# Patient Record
Sex: Female | Born: 1985
Health system: Southern US, Community
[De-identification: ages and names within clinical notes are randomized; demographics above are authoritative.]

## PROBLEM LIST (undated history)

## (undated) ENCOUNTER — Emergency Department: Discharge status: Absent without leave | Payer: Self-pay

## (undated) DIAGNOSIS — R87629 Unspecified abnormal cytological findings in specimens from vagina: Secondary | ICD-10-CM

## (undated) DIAGNOSIS — J45909 Unspecified asthma, uncomplicated: Secondary | ICD-10-CM

## (undated) DIAGNOSIS — T7840XA Allergy, unspecified, initial encounter: Secondary | ICD-10-CM

## (undated) DIAGNOSIS — K219 Gastro-esophageal reflux disease without esophagitis: Secondary | ICD-10-CM

## (undated) DIAGNOSIS — E7410 Disorder of fructose metabolism, unspecified: Secondary | ICD-10-CM

## (undated) DIAGNOSIS — D649 Anemia, unspecified: Secondary | ICD-10-CM

## (undated) DIAGNOSIS — E739 Lactose intolerance, unspecified: Secondary | ICD-10-CM

## (undated) HISTORY — DX: Unspecified asthma, uncomplicated: J45.909

## (undated) HISTORY — DX: Anemia, unspecified: D64.9

## (undated) HISTORY — DX: Disorder of fructose metabolism, unspecified: E74.10

## (undated) HISTORY — DX: Gastro-esophageal reflux disease without esophagitis: K21.9

## (undated) HISTORY — DX: Allergy, unspecified, initial encounter: T78.40XA

## (undated) HISTORY — PX: NO PAST SURGERIES: SHX2092

## (undated) HISTORY — DX: Unspecified abnormal cytological findings in specimens from vagina: R87.629

## (undated) HISTORY — DX: Lactose intolerance, unspecified: E73.9

---
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
## 2006-03-29 DIAGNOSIS — G43909 Migraine, unspecified, not intractable, without status migrainosus: Secondary | ICD-10-CM | POA: Insufficient documentation

## 2006-03-29 HISTORY — DX: Migraine, unspecified, not intractable, without status migrainosus: G43.909

## 2017-04-18 LAB — BASIC METABOLIC PANEL
BUN: 8 (ref 4–21)
CO2: 25 — AB (ref 13–22)
Chloride: 105 (ref 99–108)
Creatinine: 0.6 (ref 0.5–1.1)
Glucose: 101
Potassium: 4.6 (ref 3.4–5.3)
Sodium: 138 (ref 137–147)

## 2017-04-18 LAB — CBC AND DIFFERENTIAL
HCT: 39 (ref 36–46)
Hemoglobin: 12.6 (ref 12.0–16.0)
Neutrophils Absolute: 9.9
Platelets: 331 (ref 150–399)
WBC: 11.3

## 2017-04-18 LAB — COMPREHENSIVE METABOLIC PANEL
Albumin: 4.5 (ref 3.5–5.0)
Calcium: 9.9 (ref 8.7–10.7)
Globulin: 2.8

## 2017-04-18 LAB — HEPATIC FUNCTION PANEL
ALT: 15 (ref 7–35)
AST: 15 (ref 13–35)
Alkaline Phosphatase: 91 (ref 25–125)
Bilirubin, Total: 0.3

## 2017-04-18 LAB — CBC: RBC: 4.32 (ref 3.87–5.11)

## 2019-10-01 LAB — HM PAP SMEAR: HM Pap smear: NORMAL

## 2020-02-11 DIAGNOSIS — R42 Dizziness and giddiness: Secondary | ICD-10-CM | POA: Diagnosis not present

## 2020-02-11 DIAGNOSIS — Z131 Encounter for screening for diabetes mellitus: Secondary | ICD-10-CM | POA: Diagnosis not present

## 2020-02-11 DIAGNOSIS — Z114 Encounter for screening for human immunodeficiency virus [HIV]: Secondary | ICD-10-CM | POA: Diagnosis not present

## 2020-02-11 DIAGNOSIS — Z13220 Encounter for screening for lipoid disorders: Secondary | ICD-10-CM | POA: Diagnosis not present

## 2020-02-11 DIAGNOSIS — D539 Nutritional anemia, unspecified: Secondary | ICD-10-CM | POA: Diagnosis not present

## 2020-02-11 DIAGNOSIS — R12 Heartburn: Secondary | ICD-10-CM | POA: Diagnosis not present

## 2020-02-11 DIAGNOSIS — Z0000 Encounter for general adult medical examination without abnormal findings: Secondary | ICD-10-CM | POA: Diagnosis not present

## 2020-02-11 DIAGNOSIS — R5383 Other fatigue: Secondary | ICD-10-CM | POA: Diagnosis not present

## 2020-02-11 DIAGNOSIS — E559 Vitamin D deficiency, unspecified: Secondary | ICD-10-CM | POA: Diagnosis not present

## 2020-02-11 DIAGNOSIS — J45909 Unspecified asthma, uncomplicated: Secondary | ICD-10-CM | POA: Diagnosis not present

## 2020-02-11 DIAGNOSIS — M1990 Unspecified osteoarthritis, unspecified site: Secondary | ICD-10-CM | POA: Diagnosis not present

## 2020-02-22 ENCOUNTER — Other Ambulatory Visit (HOSPITAL_COMMUNITY): Payer: Self-pay | Admitting: Physician Assistant

## 2020-02-22 DIAGNOSIS — R12 Heartburn: Secondary | ICD-10-CM | POA: Diagnosis not present

## 2020-02-22 DIAGNOSIS — Z20822 Contact with and (suspected) exposure to covid-19: Secondary | ICD-10-CM | POA: Diagnosis not present

## 2020-02-22 DIAGNOSIS — M1990 Unspecified osteoarthritis, unspecified site: Secondary | ICD-10-CM | POA: Diagnosis not present

## 2020-02-22 DIAGNOSIS — R42 Dizziness and giddiness: Secondary | ICD-10-CM | POA: Diagnosis not present

## 2020-02-22 DIAGNOSIS — M545 Low back pain: Secondary | ICD-10-CM | POA: Diagnosis not present

## 2020-03-07 DIAGNOSIS — Z20822 Contact with and (suspected) exposure to covid-19: Secondary | ICD-10-CM | POA: Diagnosis not present

## 2020-03-12 DIAGNOSIS — J069 Acute upper respiratory infection, unspecified: Secondary | ICD-10-CM | POA: Diagnosis not present

## 2020-03-12 DIAGNOSIS — R0602 Shortness of breath: Secondary | ICD-10-CM | POA: Diagnosis not present

## 2020-04-02 DIAGNOSIS — Z30432 Encounter for removal of intrauterine contraceptive device: Secondary | ICD-10-CM | POA: Diagnosis not present

## 2020-04-21 DIAGNOSIS — M25561 Pain in right knee: Secondary | ICD-10-CM | POA: Diagnosis not present

## 2020-04-21 MED FILL — MELOXICAM 15 MG TABLET: 15 | 30 days supply | Qty: 30 | Fill #0

## 2020-04-25 DIAGNOSIS — M25561 Pain in right knee: Secondary | ICD-10-CM | POA: Diagnosis not present

## 2020-04-28 DIAGNOSIS — M25561 Pain in right knee: Secondary | ICD-10-CM | POA: Diagnosis not present

## 2020-05-16 DIAGNOSIS — N924 Excessive bleeding in the premenopausal period: Secondary | ICD-10-CM | POA: Diagnosis not present

## 2020-05-16 DIAGNOSIS — N938 Other specified abnormal uterine and vaginal bleeding: Secondary | ICD-10-CM | POA: Diagnosis not present

## 2020-05-22 DIAGNOSIS — Z20822 Contact with and (suspected) exposure to covid-19: Secondary | ICD-10-CM | POA: Diagnosis not present

## 2020-05-22 DIAGNOSIS — E559 Vitamin D deficiency, unspecified: Secondary | ICD-10-CM | POA: Diagnosis not present

## 2020-05-22 DIAGNOSIS — Z79899 Other long term (current) drug therapy: Secondary | ICD-10-CM | POA: Diagnosis not present

## 2020-05-22 DIAGNOSIS — R12 Heartburn: Secondary | ICD-10-CM | POA: Diagnosis not present

## 2020-05-22 DIAGNOSIS — M25561 Pain in right knee: Secondary | ICD-10-CM | POA: Diagnosis not present

## 2020-05-22 DIAGNOSIS — E7800 Pure hypercholesterolemia, unspecified: Secondary | ICD-10-CM | POA: Diagnosis not present

## 2020-06-11 DIAGNOSIS — Z20822 Contact with and (suspected) exposure to covid-19: Secondary | ICD-10-CM | POA: Diagnosis not present

## 2020-06-12 DIAGNOSIS — J329 Chronic sinusitis, unspecified: Secondary | ICD-10-CM | POA: Diagnosis not present

## 2020-06-12 DIAGNOSIS — E559 Vitamin D deficiency, unspecified: Secondary | ICD-10-CM | POA: Diagnosis not present

## 2020-06-12 DIAGNOSIS — B9689 Other specified bacterial agents as the cause of diseases classified elsewhere: Secondary | ICD-10-CM | POA: Diagnosis not present

## 2020-06-12 DIAGNOSIS — E7800 Pure hypercholesterolemia, unspecified: Secondary | ICD-10-CM | POA: Diagnosis not present

## 2020-07-08 DIAGNOSIS — Z20822 Contact with and (suspected) exposure to covid-19: Secondary | ICD-10-CM | POA: Diagnosis not present

## 2020-07-28 DIAGNOSIS — M1990 Unspecified osteoarthritis, unspecified site: Secondary | ICD-10-CM | POA: Diagnosis not present

## 2020-07-28 DIAGNOSIS — M545 Low back pain, unspecified: Secondary | ICD-10-CM | POA: Diagnosis not present

## 2020-07-28 DIAGNOSIS — L089 Local infection of the skin and subcutaneous tissue, unspecified: Secondary | ICD-10-CM | POA: Diagnosis not present

## 2020-08-29 DIAGNOSIS — M545 Low back pain, unspecified: Secondary | ICD-10-CM | POA: Diagnosis not present

## 2020-08-29 DIAGNOSIS — Z79899 Other long term (current) drug therapy: Secondary | ICD-10-CM | POA: Diagnosis not present

## 2020-08-29 DIAGNOSIS — E559 Vitamin D deficiency, unspecified: Secondary | ICD-10-CM | POA: Diagnosis not present

## 2020-08-29 DIAGNOSIS — Z20822 Contact with and (suspected) exposure to covid-19: Secondary | ICD-10-CM | POA: Diagnosis not present

## 2020-08-29 DIAGNOSIS — L089 Local infection of the skin and subcutaneous tissue, unspecified: Secondary | ICD-10-CM | POA: Diagnosis not present

## 2020-08-29 DIAGNOSIS — E7800 Pure hypercholesterolemia, unspecified: Secondary | ICD-10-CM | POA: Diagnosis not present

## 2020-08-29 DIAGNOSIS — M1990 Unspecified osteoarthritis, unspecified site: Secondary | ICD-10-CM | POA: Diagnosis not present

## 2020-09-09 ENCOUNTER — Ambulatory Visit: Payer: Self-pay | Admitting: Medical-Surgical

## 2020-09-18 DIAGNOSIS — J018 Other acute sinusitis: Secondary | ICD-10-CM | POA: Diagnosis not present

## 2020-09-18 DIAGNOSIS — H9203 Otalgia, bilateral: Secondary | ICD-10-CM | POA: Diagnosis not present

## 2020-10-06 DIAGNOSIS — Z1159 Encounter for screening for other viral diseases: Secondary | ICD-10-CM | POA: Diagnosis not present

## 2020-10-06 DIAGNOSIS — Z111 Encounter for screening for respiratory tuberculosis: Secondary | ICD-10-CM | POA: Diagnosis not present

## 2020-10-06 DIAGNOSIS — Z020 Encounter for examination for admission to educational institution: Secondary | ICD-10-CM | POA: Diagnosis not present

## 2020-10-06 DIAGNOSIS — Z7251 High risk heterosexual behavior: Secondary | ICD-10-CM | POA: Diagnosis not present

## 2020-10-06 DIAGNOSIS — Z20822 Contact with and (suspected) exposure to covid-19: Secondary | ICD-10-CM | POA: Diagnosis not present

## 2020-10-06 DIAGNOSIS — R300 Dysuria: Secondary | ICD-10-CM | POA: Diagnosis not present

## 2020-10-06 NOTE — Progress Notes (Signed)
New Patient Office Visit  Subjective:  Patient ID: Erica Boyer, female    DOB: 10/29/1985  Age: 35 y.o. MRN: [id number]  CC:  Chief Complaint  Patient presents with  . Establish Care    HPI Erica Boyer presents to establish care.  Asthma-she does have a history of asthma but usually does not have much trouble with it.  Notes that her asthma symptoms are not worse when her seasonal allergies flareup.  Has an albuterol inhaler that she uses infrequently as needed.  Seasonal allergies-lifelong history of severe seasonal allergies.  She is currently taking Zyrtec and has been on this for years.  Also using Flonase daily.  She used to take Singulair in the past but notes that it started making her have mood swings and she did not like this effect so she stopped the medicine on her own.  Birth control-currently sexually active with 1 female partner, her husband.  They have a 23-1/2-year-old child and were initially trying to get pregnant since November of last year.  Recently got admitted to school as did her husband so they are going to hold off on trying to conceive for now.  Since she will be wearing white uniforms, she would like to return to using NuvaRing as she did in years prior.  Notes that she likes the NuvaRing because she is able to skip periods and it worked well for her in the past.  She is a non-smoker and although she does have a history of migraines, they did not involve an aura.  Sinus/ear pain-has had 2 weeks of sinus fullness/congestion.  Notes that her ears have been hurting her starting behind the earlobes and extending down the neck. Has sinus pain over the frontal and maxillary areas but no dental pain.  Clear to milky nasal discharge when blowing her nose.  Mild cough.  Denies fever, chills, shortness of breath, chest pain, or GI symptoms.  Past Medical History:  Diagnosis Date  . Asthma   . Fructose intolerance   . GERD (gastroesophageal reflux disease)   .  Lactose intolerance     Past Surgical History:  Procedure Laterality Date  . NO PAST SURGERIES      Family History  Problem Relation Age of Onset  . Hypertension Other   . Heart attack Other   . Glaucoma Other   . Cataracts Other   . Dementia Other   . Diabetes Other   . Stroke Other   . Esophageal cancer Other     Social History   Socioeconomic History  . Marital status: Unknown    Spouse name: Not on file  . Number of children: Not on file  . Years of education: Not on file  . Highest education level: Not on file  Occupational History  . Not on file  Tobacco Use  . Smoking status: Never Smoker  . Smokeless tobacco: Never Used  Vaping Use  . Vaping Use: Never used  Substance and Sexual Activity  . Alcohol use: Yes    Comment: Rarely  . Drug use: Never  . Sexual activity: Yes  Other Topics Concern  . Not on file  Social History Narrative  . Not on file   Social Determinants of Health   Financial Resource Strain: Not on file  Food Insecurity: Not on file  Transportation Needs: Not on file  Physical Activity: Not on file  Stress: Not on file  Social Connections: Not on file  Intimate Partner Violence: Not  on file    ROS Review of Systems  Constitutional: Negative for chills, fatigue, fever and unexpected weight change.  HENT: Positive for congestion, ear pain, sinus pressure and sinus pain.   Respiratory: Negative for cough, chest tightness, shortness of breath and wheezing.   Cardiovascular: Negative for chest pain, palpitations and leg swelling.  Neurological: Positive for headaches (Intermittent, infrequent). Negative for dizziness and light-headedness.  Psychiatric/Behavioral: Negative for dysphoric mood, self-injury, sleep disturbance and suicidal ideas. The patient is not nervous/anxious.     Objective:   Today's Vitals: BP 119/82   Pulse 89   Temp 98.4 F (36.9 C)   Ht 5' 1.25" (1.556 m)   Wt 219 lb 9.6 oz (99.6 kg)   LMP 09/24/2020    SpO2 98%   BMI 41.16 kg/m   Physical Exam Vitals reviewed.  Constitutional:      General: She is not in acute distress.    Appearance: Normal appearance.  HENT:     Head: Normocephalic and atraumatic.     Right Ear: Ear canal and external ear normal.     Left Ear: Ear canal and external ear normal.     Ears:     Comments: Bilateral serous effusion    Nose: Congestion present.  Eyes:     General:        Right eye: No discharge.        Left eye: No discharge.     Extraocular Movements: Extraocular movements intact.     Conjunctiva/sclera: Conjunctivae normal.     Pupils: Pupils are equal, round, and reactive to light.  Cardiovascular:     Rate and Rhythm: Normal rate and regular rhythm.     Pulses: Normal pulses.     Heart sounds: Normal heart sounds. No murmur heard. No friction rub. No gallop.   Pulmonary:     Effort: Pulmonary effort is normal. No respiratory distress.     Breath sounds: Normal breath sounds. No wheezing.  Skin:    General: Skin is warm and dry.  Neurological:     Mental Status: She is alert and oriented to person, place, and time.  Psychiatric:        Mood and Affect: Mood normal.        Behavior: Behavior normal.        Thought Content: Thought content normal.        Judgment: Judgment normal.    Assessment & Plan:   1. Encounter to establish care Reviewed available information and discussed healthcare concerns with patient.  She will be due for her Pap smear soon and originally had this scheduled with OB/GYN.  She may decide to cancel that appointment and have her return to her care needs done here.  2. Encounter for initial prescription of contraceptives, unspecified contraceptive POCT UPT negative.  Starting NuvaRing. - POCT urine pregnancy  3. Acute non-recurrent pansinusitis Since she has had 2 weeks of symptoms, we will go ahead and treat for acute sinusitis.  She does have an allergy to Keflex because of shortness of breath and hives but  she has tolerated Augmentin without difficulty.  Starting Augmentin twice daily x7 days.  Advised should she have any side effects or problems when she takes this medication, she should notify me immediately and we can change the antibiotic.  4. Mild intermittent asthma without complication Continue albuterol inhaler 2 puffs every 4 hours as needed.  Refills provided.  5. Seasonal allergies Recommend switching from Zyrtec to Xyzal since she  has been on Zyrtec for many years and this is not controlling her allergies very well.  Continue Flonase.  Since Singulair caused mood swings, we will hold off on adding this back for now.  Outpatient Encounter Medications as of 10/07/2020  Medication Sig  . amoxicillin-clavulanate (AUGMENTIN) 875-125 MG tablet Take 1 tablet by mouth 2 (two) times daily.  Marland Kitchen etonogestrel-ethinyl estradiol (NUVARING) 0.12-0.015 MG/24HR vaginal ring Insert vaginally and leave in place for 3 consecutive weeks, then remove for 1 week.  . fluticasone (FLONASE) 50 MCG/ACT nasal spray Place into both nostrils daily.  Marland Kitchen levocetirizine (XYZAL) 5 MG tablet Take 1 tablet (5 mg total) by mouth every evening.  . [DISCONTINUED] albuterol (VENTOLIN HFA) 108 (90 Base) MCG/ACT inhaler Take by mouth.  . [DISCONTINUED] Cetirizine HCl (ZYRTEC ALLERGY) 10 MG CAPS Take 1 tablet by mouth daily.  . [DISCONTINUED] cyclobenzaprine (FLEXERIL) 5 MG tablet Take by mouth.  . [DISCONTINUED] ferrous sulfate 325 (65 FE) MG tablet Take 1 tablet by mouth daily.  . [DISCONTINUED] meclizine (ANTIVERT) 25 MG tablet Take by mouth.  . [DISCONTINUED] omeprazole (PRILOSEC) 40 MG capsule   . albuterol (VENTOLIN HFA) 108 (90 Base) MCG/ACT inhaler Inhale 2 puffs into the lungs every 4 (four) hours as needed for wheezing or shortness of breath.  Marland Kitchen omeprazole (PRILOSEC) 40 MG capsule Take 1 capsule (40 mg total) by mouth daily.  . [DISCONTINUED] levonorgestrel (MIRENA) 20 MCG/24HR IUD by Intrauterine route.  .  [DISCONTINUED] meloxicam (MOBIC) 15 MG tablet TAKE 1 TABLET BY MOUTH ONCE A DAY  . [DISCONTINUED] Multiple Vitamins-Minerals (MULTIVITAMIN GUMMIES WOMENS) CHEW   . [DISCONTINUED] nabumetone (RELAFEN) 750 MG tablet   . [DISCONTINUED] Vitamin D, Ergocalciferol, (DRISDOL) 1.25 MG (50000 UNIT) CAPS capsule    No facility-administered encounter medications on file as of 10/07/2020.    Follow-up: Return in about 6 weeks (around 11/18/2020) for birth control follow up.   Thayer Ohm, DNP, APRN, FNP-BC [facility]

## 2020-10-07 ENCOUNTER — Ambulatory Visit (INDEPENDENT_AMBULATORY_CARE_PROVIDER_SITE_OTHER): Payer: 59 | Admitting: Medical-Surgical

## 2020-10-07 ENCOUNTER — Encounter: Payer: Self-pay | Admitting: Medical-Surgical

## 2020-10-07 VITALS — BP 119/82 | HR 89 | Temp 98.4°F | Ht 61.25 in | Wt 219.6 lb

## 2020-10-07 DIAGNOSIS — J4520 Mild intermittent asthma, uncomplicated: Secondary | ICD-10-CM

## 2020-10-07 DIAGNOSIS — Z30019 Encounter for initial prescription of contraceptives, unspecified: Secondary | ICD-10-CM

## 2020-10-07 DIAGNOSIS — Z7689 Persons encountering health services in other specified circumstances: Secondary | ICD-10-CM | POA: Diagnosis not present

## 2020-10-07 DIAGNOSIS — J0140 Acute pansinusitis, unspecified: Secondary | ICD-10-CM | POA: Diagnosis not present

## 2020-10-07 DIAGNOSIS — J302 Other seasonal allergic rhinitis: Secondary | ICD-10-CM | POA: Diagnosis not present

## 2020-10-07 DIAGNOSIS — Z114 Encounter for screening for human immunodeficiency virus [HIV]: Secondary | ICD-10-CM

## 2020-10-07 DIAGNOSIS — Z1159 Encounter for screening for other viral diseases: Secondary | ICD-10-CM

## 2020-10-07 LAB — POCT URINE PREGNANCY: Preg Test, Ur: NEGATIVE

## 2020-10-07 MED ORDER — LEVOCETIRIZINE DIHYDROCHLORIDE 5 MG PO TABS: 5.0000 mg | ORAL_TABLET | Freq: Every evening | ORAL | 3 refills | Status: DC

## 2020-10-07 MED ORDER — ALBUTEROL SULFATE HFA 108 (90 BASE) MCG/ACT IN AERS: 2.0000 | INHALATION_SPRAY | RESPIRATORY_TRACT | 6 refills | Status: DC | PRN

## 2020-10-07 MED ORDER — OMEPRAZOLE 40 MG PO CPDR: 40.0000 mg | DELAYED_RELEASE_CAPSULE | Freq: Every day | ORAL | 3 refills | Status: DC

## 2020-10-07 MED ORDER — AMOXICILLIN-POT CLAVULANATE 875-125 MG PO TABS: 1.0000 | ORAL_TABLET | Freq: Two times a day (BID) | ORAL | 0 refills | Status: DC

## 2020-10-07 MED ORDER — ETONOGESTREL-ETHINYL ESTRADIOL 0.12-0.015 MG/24HR VA RING
VAGINAL_RING | VAGINAL | 0 refills | Status: DC
Start: 2020-10-07 — End: 2021-01-26
  Filled 2020-11-22: qty 3, 84d supply, fill #0

## 2020-10-07 NOTE — Patient Instructions (Signed)
Critical care medicine: Principles of diagnosis and management in the adult (4th ed., pp. 1251-1270). Saunders."> Miller's anesthesia (8th ed., pp. 232-250). Saunders.">  Advance Directive  Advance directives are legal documents that allow you to make decisions about your health care and medical treatment in case you become unable to communicate for yourself. Advance directives let your wishes be known to family, friends, and health care providers. Discussing and writing advance directives should happen over time rather than all at once. Advance directives can be changed and updated at any time. There are different types of advance directives, such as:  Medical power of attorney.  Living will.  Do not resuscitate (DNR) order or do not attempt resuscitation (DNAR) order. Health care proxy and medical power of attorney A health care proxy is also called a health care agent. This person is appointed to make medical decisions for you when you are unable to make decisions for yourself. Generally, people ask a trusted friend or family member to act as their proxy and represent their preferences. Make sure you have an agreement with your trusted person to act as your proxy. A proxy may have to make a medical decision on your behalf if your wishes are not known. A medical power of attorney, also called a durable power of attorney for health care, is a legal document that names your health care proxy. Depending on the laws in your state, the document may need to be:  Signed.  Notarized.  Dated.  Copied.  Witnessed.  Incorporated into your medical record. You may also want to appoint a trusted person to manage your money in the event you are unable to do so. This is called a durable power of attorney for finances. It is a separate legal document from the durable power of attorney for health care. You may choose your health care proxy or someone different to act as your agent in money matters. If you  do not appoint a proxy, or there is a concern that the proxy is not acting in your best interest, a court may appoint a guardian to act on your behalf. Living will A living will is a set of instructions that state your wishes about medical care when you cannot express them yourself. Health care providers should keep a copy of your living will in your medical record. You may want to give a copy to family members or friends. To alert caregivers in case of an emergency, you can place a card in your wallet to let them know that you have a living will and where they can find it. A living will is used if you become:  Terminally ill.  Disabled.  Unable to communicate or make decisions. The following decisions should be included in your living will:  To use or not to use life support equipment, such as dialysis machines and breathing machines (ventilators).  Whether you want a DNR or DNAR order. This tells health care providers not to use cardiopulmonary resuscitation (CPR) if breathing or heartbeat stops.  To use or not to use tube feeding.  To be given or not to be given food and fluids.  Whether you want comfort (palliative) care when the goal becomes comfort rather than a cure.  Whether you want to donate your organs and tissues. A living will does not give instructions for distributing your money and property if you should pass away. DNR or DNAR A DNR or DNAR order is a request not to have CPR in   the event that your heart stops beating or you stop breathing. If a DNR or DNAR order has not been made and shared, a health care provider will try to help any patient whose heart has stopped or who has stopped breathing. If you plan to have surgery, talk with your health care provider about how your DNR or DNAR order will be followed if problems occur. What if I do not have an advance directive? Some states assign family decision makers to act on your behalf if you do not have an advance directive.  Each state has its own laws about advance directives. You may want to check with your health care provider, attorney, or state representative about the laws in your state. Summary  Advance directives are legal documents that allow you to make decisions about your health care and medical treatment in case you become unable to communicate for yourself.  The process of discussing and writing advance directives should happen over time. You can change and update advance directives at any time.  Advance directives may include a medical power of attorney, a living will, and a DNR or DNAR order. This information is not intended to replace advice given to you by your health care provider. Make sure you discuss any questions you have with your health care provider. Document Revised: 03/04/2020 Document Reviewed: 03/04/2020 Elsevier Patient Education  2021 Elsevier Inc.  

## 2020-10-16 ENCOUNTER — Encounter: Payer: Self-pay | Admitting: Obstetrics & Gynecology

## 2020-10-31 DIAGNOSIS — Z111 Encounter for screening for respiratory tuberculosis: Secondary | ICD-10-CM | POA: Diagnosis not present

## 2020-11-13 ENCOUNTER — Other Ambulatory Visit (HOSPITAL_COMMUNITY): Payer: Self-pay

## 2020-11-14 ENCOUNTER — Other Ambulatory Visit: Payer: Self-pay

## 2020-11-14 ENCOUNTER — Other Ambulatory Visit (HOSPITAL_COMMUNITY): Payer: Self-pay

## 2020-11-14 DIAGNOSIS — J302 Other seasonal allergic rhinitis: Secondary | ICD-10-CM

## 2020-11-14 MED ORDER — LEVOCETIRIZINE DIHYDROCHLORIDE 5 MG PO TABS
5.0000 mg | ORAL_TABLET | Freq: Every evening | ORAL | 3 refills | Status: DC
  Filled 2020-11-14: qty 90, 90d supply, fill #0

## 2020-11-14 MED ORDER — VITAMIN D (ERGOCALCIFEROL) 1.25 MG (50000 UNIT) PO CAPS
50000.0000 [IU] | ORAL_CAPSULE | ORAL | 3 refills | Status: DC
  Filled 2020-11-22: qty 4, 28d supply, fill #0

## 2020-11-22 ENCOUNTER — Other Ambulatory Visit (HOSPITAL_COMMUNITY): Payer: Self-pay

## 2020-11-28 ENCOUNTER — Other Ambulatory Visit (HOSPITAL_COMMUNITY): Payer: Self-pay

## 2020-11-29 ENCOUNTER — Other Ambulatory Visit (HOSPITAL_COMMUNITY): Payer: Self-pay

## 2020-12-05 ENCOUNTER — Other Ambulatory Visit (HOSPITAL_COMMUNITY): Payer: Self-pay

## 2020-12-16 ENCOUNTER — Ambulatory Visit: Payer: 59 | Admitting: Medical-Surgical

## 2020-12-22 ENCOUNTER — Encounter: Payer: Self-pay | Admitting: Medical-Surgical

## 2020-12-22 ENCOUNTER — Ambulatory Visit: Payer: 59 | Admitting: Medical-Surgical

## 2020-12-22 ENCOUNTER — Other Ambulatory Visit: Payer: Self-pay

## 2020-12-22 VITALS — BP 132/77 | HR 85 | Temp 98.7°F | Ht 61.25 in | Wt 221.3 lb

## 2020-12-22 DIAGNOSIS — F418 Other specified anxiety disorders: Secondary | ICD-10-CM | POA: Diagnosis not present

## 2020-12-22 DIAGNOSIS — J0100 Acute maxillary sinusitis, unspecified: Secondary | ICD-10-CM

## 2020-12-22 MED ORDER — FLUCONAZOLE 150 MG PO TABS: 150.0000 mg | ORAL_TABLET | Freq: Once | ORAL | 0 refills | Status: AC

## 2020-12-22 MED ORDER — AMOXICILLIN-POT CLAVULANATE 875-125 MG PO TABS: 1.0000 | ORAL_TABLET | Freq: Two times a day (BID) | ORAL | 0 refills | Status: AC

## 2020-12-22 MED ORDER — HYDROXYZINE HCL 25 MG PO TABS: 12.5000 mg | ORAL_TABLET | Freq: Three times a day (TID) | ORAL | 0 refills | Status: DC | PRN

## 2020-12-22 MED ORDER — ESCITALOPRAM OXALATE 10 MG PO TABS
ORAL_TABLET | ORAL | 0 refills | Status: DC
Start: 2020-12-22 — End: 2021-01-30
  Filled 2021-01-26: qty 30, 30d supply, fill #0

## 2020-12-22 NOTE — Progress Notes (Signed)
Subjective:    CC: anxiety  HPI: Pleasant 35 year old female presenting to discuss mood concerns. She is a full time Consulting civil engineer at [employer]. She also works full time and has a 1.47 year old daughter and husband. She is feeling quite overwhelmed these days and finds herself unable to cope with the anxiety that comes with it. Has been using Ashwaghanda gummies for test anxiety with some benefit. Practices deep breathing and reflection. Helps vent stressors by talking to her mom and even herself at times. Has not taken medications for mood concerns before but feels that it may be time to try something. Interested in getting connected with counseling.   Had a cold about a week ago with sinus congestion and general malaise. The majority of her symptoms resolved but she has been left with continued sinus pressure/pain, dental pain in the top teeth when bending, and maxillary facial pain.   I reviewed the past medical history, family history, social history, surgical history, and allergies today and no changes were needed.  Please see the problem list section below in epic for further details.  Past Medical History: Past Medical History:  Diagnosis Date   Asthma    Fructose intolerance    GERD (gastroesophageal reflux disease)    Lactose intolerance    Past Surgical History: Past Surgical History:  Procedure Laterality Date   NO PAST SURGERIES     Social History: Social History   Socioeconomic History   Marital status: Married    Spouse name: Not on file   Number of children: Not on file   Years of education: Not on file   Highest education level: Not on file  Occupational History   Not on file  Tobacco Use   Smoking status: Never   Smokeless tobacco: Never  Vaping Use   Vaping Use: Never used  Substance and Sexual Activity   Alcohol use: Yes    Comment: Rarely   Drug use: Never   Sexual activity: Yes  Other Topics Concern   Not on file  Social History Narrative   Not on file    Social Determinants of Health   Financial Resource Strain: Not on file  Food Insecurity: Not on file  Transportation Needs: Not on file  Physical Activity: Not on file  Stress: Not on file  Social Connections: Not on file   Family History: Family History  Problem Relation Age of Onset   Hypertension Other    Heart attack Other    Glaucoma Other    Cataracts Other    Dementia Other    Diabetes Other    Stroke Other    Esophageal cancer Other    Allergies: Allergies  Allergen Reactions   Cephalexin Hives, Itching and Shortness Of Breath   Mometasone Other (See Comments)    Blurred vision   Moxifloxacin Rash    dizziness   Sumatriptan Photosensitivity    Worsens migraine    Sumatriptan-Naproxen Sodium Photosensitivity   Medications: See med rec.  Review of Systems: See HPI for pertinent positives and negatives.   Objective:    General: Well Developed, well nourished, and in no acute distress. Tearful at times.  Neuro: Alert and oriented x3.  HEENT: Normocephalic, atraumatic. Tenderness to palpation along bilateral maxillary sinuses. No lymphadenopathy.  Skin: Warm and dry. Cardiac: Regular rate and rhythm, no murmurs rubs or gallops, no lower extremity edema.  Respiratory: Clear to auscultation bilaterally. Not using accessory muscles, speaking in full sentences.  Impression and Recommendations:  1. Anxiety with depression Starting Lexapro 5mg  daily for 8 days then increase to 10mg  daily. Hydroxyzine TID prn severe anxiety. Advised that Hydroxyzine may cause drowsiness so try the medication on a day off to evaluate tolerance. If too sedating, may consider switching to BuSpar. Referring to behavioral health for counseling.  - Ambulatory referral to Behavioral Health  2. Acute non-recurrent maxillary sinusitis Start Augmentin BID x 7 days. Sending in Diflucan 150mg  x 1 dose for VVC prophylaxis.   No follow-ups on  file. ___________________________________________ , DNP, APRN, FNP-BC Primary Care and Sports Medicine [facility] [place]

## 2020-12-23 ENCOUNTER — Encounter: Payer: Self-pay | Admitting: Medical-Surgical

## 2021-01-25 ENCOUNTER — Other Ambulatory Visit: Payer: Self-pay | Admitting: Medical-Surgical

## 2021-01-26 ENCOUNTER — Ambulatory Visit: Payer: 59 | Admitting: Medical-Surgical

## 2021-01-26 ENCOUNTER — Other Ambulatory Visit: Payer: Self-pay | Admitting: Medical-Surgical

## 2021-01-26 ENCOUNTER — Other Ambulatory Visit (HOSPITAL_COMMUNITY): Payer: Self-pay

## 2021-01-26 MED ORDER — HYDROXYZINE HCL 25 MG PO TABS
12.5000 mg | ORAL_TABLET | Freq: Three times a day (TID) | ORAL | 0 refills | Status: DC | PRN
  Filled 2021-01-26: qty 30, 5d supply, fill #0

## 2021-01-26 MED FILL — Etonogestrel-Ethinyl Estradiol VA Ring 0.12-0.015 MG/24HR: VAGINAL | 28 days supply | Qty: 1 | Fill #0 | Status: AC

## 2021-01-28 ENCOUNTER — Ambulatory Visit: Payer: 59 | Admitting: Medical-Surgical

## 2021-02-02 ENCOUNTER — Ambulatory Visit: Payer: 59 | Admitting: Medical-Surgical

## 2021-02-02 ENCOUNTER — Other Ambulatory Visit (HOSPITAL_COMMUNITY): Payer: Self-pay

## 2021-02-02 ENCOUNTER — Other Ambulatory Visit: Payer: Self-pay

## 2021-02-02 ENCOUNTER — Encounter: Payer: Self-pay | Admitting: Medical-Surgical

## 2021-02-02 VITALS — BP 111/79 | HR 84 | Resp 20 | Ht 61.25 in | Wt 218.0 lb

## 2021-02-02 DIAGNOSIS — L03032 Cellulitis of left toe: Secondary | ICD-10-CM

## 2021-02-02 DIAGNOSIS — F418 Other specified anxiety disorders: Secondary | ICD-10-CM

## 2021-02-02 HISTORY — DX: Other specified anxiety disorders: F41.8

## 2021-02-02 MED ORDER — HYDROXYZINE HCL 25 MG PO TABS
12.5000 mg | ORAL_TABLET | Freq: Three times a day (TID) | ORAL | 0 refills | Status: DC | PRN
  Filled 2021-02-02: qty 180, 30d supply, fill #0

## 2021-02-02 MED ORDER — DOXYCYCLINE HYCLATE 100 MG PO TABS
100.0000 mg | ORAL_TABLET | Freq: Two times a day (BID) | ORAL | 0 refills | Status: AC
  Filled 2021-02-02: qty 14, 7d supply, fill #0

## 2021-02-02 MED ORDER — ESCITALOPRAM OXALATE 10 MG PO TABS
10.0000 mg | ORAL_TABLET | Freq: Every day | ORAL | 1 refills | Status: DC
  Filled 2021-02-02 – 2021-02-26 (×2): qty 90, 90d supply, fill #0
  Filled 2021-05-22: qty 90, 90d supply, fill #1

## 2021-02-02 MED ORDER — FLUCONAZOLE 150 MG PO TABS
150.0000 mg | ORAL_TABLET | Freq: Once | ORAL | 0 refills | Status: AC
  Filled 2021-02-02: qty 1, 1d supply, fill #0

## 2021-02-02 NOTE — Progress Notes (Addendum)
HPI with pertinent ROS:   CC: Mood follow-up  HPI: Pleasant 35 year old female presenting today for follow-up on mood concerns.  Approximately 4 weeks ago she was started on Lexapro with as needed hydroxyzine and referred to behavioral health for counseling.  Has been taking Lexapro as prescribed, no side effects, tolerating very well.  Feels that she has made quite a bit of good progress on the medication.  Is noticing that she is not as irritable lately and reports her family is this as well.  She feels more positive but she does still have some anxiety regarding school/work.  She does use lists to help handle this anxiety which is somewhat helpful.  She has had to use hydroxyzine approximately twice a week, notes that it does not make her sedated but does help with anxiety.  She had a hangnail on the medial side of her left great toe and was unable to get in with the lady who does her toenails.  She picked at it and ended up pulling the toenail back into the nailbed.  Now it is red, swollen, and very tender.  I reviewed the past medical history, family history, social history, surgical history, and allergies today and no changes were needed.  Please see the problem list section below in epic for further details.  Depression screen [facility] 2/9 02/02/2021 12/22/2020 10/07/2020  Decreased Interest 0 0 0  Down, Depressed, Hopeless 0 0 0  PHQ - 2 Score 0 0 0  Altered sleeping 1 2 0  Tired, decreased energy 1 3 0  Change in appetite 1 2 0  Feeling bad or failure about yourself  0 0 0  Trouble concentrating 0 0 0  Moving slowly or fidgety/restless 0 2 0  Suicidal thoughts 0 0 0  PHQ-9 Score 3 9 0  Difficult doing work/chores Somewhat difficult Very difficult Not difficult at all   GAD 7 : Generalized Anxiety Score 02/02/2021 12/22/2020 10/07/2020  Nervous, Anxious, on Edge 2 2 1   Control/stop worrying 1 3 0  Worry too much - different things 1 3 1   Trouble relaxing 0 3 1  Restless 0 2 0  Easily  annoyed or irritable 0 2 0  Afraid - awful might happen 0 0 0  Total GAD 7 Score 4 15 3   Anxiety Difficulty Somewhat difficult Very difficult Somewhat difficult     Physical exam:   General: Well Developed, well nourished, and in no acute distress.  Neuro: Alert and oriented x3.  HEENT: Normocephalic, atraumatic.  Skin: Warm and dry.  Medial skin fold of the left great toe erythematous, swollen, tender to touch. Cardiac: Regular rate and rhythm, no murmurs rubs or gallops, no lower extremity edema.  Respiratory: Clear to auscultation bilaterally. Not using accessory muscles, speaking in full sentences.  Impression and Recommendations:    1. Anxiety with depression Continue Lexapro 10 mg daily and hydroxyzine as needed.  Symptoms well controlled.  Recommend using an online option such as Trello to help with list making and keeping up-to-date on school and work requirements. - escitalopram (LEXAPRO) 10 MG tablet; Take 1 tablet (10 mg total) by mouth daily.  Dispense: 90 tablet; Refill: 1 - hydrOXYzine (ATARAX/VISTARIL) 25 MG tablet; Take 1/2 to 2 tablets (12.5-50 mg total) by mouth 3 times daily as needed.  Dispense: 180 tablet; Refill: 0  2. Paronychia of great toe, left Allergy to cephalosporins so avoiding Keflex.  Sending in doxycycline twice daily x7 days.  Diflucan x1 for vulvovaginal candidiasis  prophylaxis.  Return in about 6 months (around 08/05/2021) for annual physical exam. ___________________________________________ Thayer Ohm, DNP, APRN, FNP-BC Primary Care and Sports Medicine [facility]

## 2021-02-03 ENCOUNTER — Other Ambulatory Visit (HOSPITAL_COMMUNITY): Payer: Self-pay

## 2021-02-16 DIAGNOSIS — Z23 Encounter for immunization: Secondary | ICD-10-CM | POA: Diagnosis not present

## 2021-02-27 ENCOUNTER — Other Ambulatory Visit (HOSPITAL_COMMUNITY): Payer: Self-pay

## 2021-03-05 DIAGNOSIS — Z6841 Body Mass Index (BMI) 40.0 and over, adult: Secondary | ICD-10-CM | POA: Diagnosis not present

## 2021-03-05 DIAGNOSIS — Z01419 Encounter for gynecological examination (general) (routine) without abnormal findings: Secondary | ICD-10-CM | POA: Diagnosis not present

## 2021-03-09 ENCOUNTER — Ambulatory Visit (INDEPENDENT_AMBULATORY_CARE_PROVIDER_SITE_OTHER): Payer: 59

## 2021-03-09 ENCOUNTER — Other Ambulatory Visit (HOSPITAL_COMMUNITY): Payer: Self-pay

## 2021-03-09 ENCOUNTER — Ambulatory Visit (INDEPENDENT_AMBULATORY_CARE_PROVIDER_SITE_OTHER): Payer: 59 | Admitting: Sports Medicine

## 2021-03-09 ENCOUNTER — Other Ambulatory Visit: Payer: Self-pay

## 2021-03-09 DIAGNOSIS — S93492A Sprain of other ligament of left ankle, initial encounter: Secondary | ICD-10-CM | POA: Diagnosis not present

## 2021-03-09 DIAGNOSIS — M25562 Pain in left knee: Secondary | ICD-10-CM

## 2021-03-09 DIAGNOSIS — M8540 Solitary bone cyst, unspecified site: Secondary | ICD-10-CM | POA: Diagnosis not present

## 2021-03-09 DIAGNOSIS — M222X2 Patellofemoral disorders, left knee: Secondary | ICD-10-CM

## 2021-03-09 DIAGNOSIS — Z09 Encounter for follow-up examination after completed treatment for conditions other than malignant neoplasm: Secondary | ICD-10-CM | POA: Diagnosis not present

## 2021-03-09 DIAGNOSIS — S93402A Sprain of unspecified ligament of left ankle, initial encounter: Secondary | ICD-10-CM | POA: Insufficient documentation

## 2021-03-09 DIAGNOSIS — M25572 Pain in left ankle and joints of left foot: Secondary | ICD-10-CM | POA: Diagnosis not present

## 2021-03-09 DIAGNOSIS — S99912A Unspecified injury of left ankle, initial encounter: Secondary | ICD-10-CM | POA: Diagnosis not present

## 2021-03-09 DIAGNOSIS — M7989 Other specified soft tissue disorders: Secondary | ICD-10-CM | POA: Diagnosis not present

## 2021-03-09 MED ORDER — MELOXICAM 15 MG PO TABS
ORAL_TABLET | ORAL | 3 refills | Status: DC
  Filled 2021-03-09: qty 30, 30d supply, fill #0

## 2021-03-09 NOTE — Assessment & Plan Note (Signed)
Also has some left anterior knee pain, swelling going down into the lower extremity, positive patellar compression, ligaments are stable, negative McMurray's sign. Explained the anatomy and pathophysiology of patellofemoral syndrome, we will order some x-rays, add meloxicam, therapy, return to see me in 6 weeks.

## 2021-03-09 NOTE — Progress Notes (Signed)
    Procedures performed today:    None.  Independent interpretation of notes and tests performed by another provider:   None.  Brief History, Exam, Impression, and Recommendations:    Left ankle sprain This is a pleasant 35 year old female, just over a week ago she was dancing at a wedding, subsequently had some pain in the lateral ankle, clinically this is at the ATFL, ankle is stable, minimal swelling. Adding an ASO, physical therapy for ankle conditioning and meloxicam, we will get some x-rays. Return to see me in 6 weeks, MRI if no better.  Patellofemoral syndrome, left Also has some left anterior knee pain, swelling going down into the lower extremity, positive patellar compression, ligaments are stable, negative McMurray's sign. Explained the anatomy and pathophysiology of patellofemoral syndrome, we will order some x-rays, add meloxicam, therapy, return to see me in 6 weeks.    ___________________________________________ Ihor Austin. Benjamin Stain, M.D., ABFM., CAQSM. Primary Care and Sports Medicine [facility]  Adjunct Instructor of Family Medicine  [employer]

## 2021-03-09 NOTE — Assessment & Plan Note (Signed)
This is a pleasant 35 year old female, just over a week ago she was dancing at a wedding, subsequently had some pain in the lateral ankle, clinically this is at the ATFL, ankle is stable, minimal swelling. Adding an ASO, physical therapy for ankle conditioning and meloxicam, we will get some x-rays. Return to see me in 6 weeks, MRI if no better.

## 2021-03-27 ENCOUNTER — Encounter: Payer: Self-pay | Admitting: Medical-Surgical

## 2021-03-27 ENCOUNTER — Ambulatory Visit: Payer: 59 | Admitting: Medical-Surgical

## 2021-03-27 ENCOUNTER — Other Ambulatory Visit: Payer: Self-pay

## 2021-03-27 VITALS — BP 116/90 | HR 92 | Resp 20 | Ht 61.25 in | Wt 219.0 lb

## 2021-03-27 DIAGNOSIS — M7989 Other specified soft tissue disorders: Secondary | ICD-10-CM | POA: Diagnosis not present

## 2021-03-27 DIAGNOSIS — N644 Mastodynia: Secondary | ICD-10-CM

## 2021-03-27 NOTE — Progress Notes (Signed)
  HPI with pertinent ROS:   CC: swelling in armpits  HPI: Pleasant 35 year old female presenting today for evaluation of bilateral axillary swelling and breast tenderness.  Notes that this has been happening on and off for the last couple of years.  She was evaluated previously when she lived in [place] and a mammogram was recommended.  Unfortunately, they relocated prior to her being able to have this done.  She has been monitoring the axillary swelling and notes that the right is worse than the left.  She did not seek further evaluation for this until it started to bother her recently.  Now, when she wears a bra, especially an underwire bra, the swollen area begins to hurt and gets very tender.  Over the last several days, she has also developed bilateral breast tenderness and nipple sensitivity.  Her last menstrual period was last week and only lasted 4 days which is abnormal for her.  Her normal flow is for 5 to 7 days and occurs monthly with the use of NuvaRing.  Has taken 2 pregnancy test at home in the last 48 hours and both have been negative.  Denies fever, chills, unusual skin lesions, and nipple discharge.  I reviewed the past medical history, family history, social history, surgical history, and allergies today and no changes were needed.  Please see the problem list section below in epic for further details.   Physical exam:   General: Well Developed, well nourished, and in no acute distress.  Neuro: Alert and oriented x3.  HEENT: Normocephalic, atraumatic.  Skin: Warm and dry. Cardiac: Regular rate and rhythm.  Respiratory: Not using accessory muscles, speaking in full sentences.  Impression and Recommendations:    1. Axillary swelling 2. Breast pain Diagnostic mammogram with bilateral ultrasound including axilla ordered today.  Recommend wearing a well fitted bra without underwire.  Okay to use cool compresses several times daily.  Tylenol and ibuprofen for pain/discomfort. -  MM DIAG BREAST TOMO BILATERAL; Future - US BREAST COMPLETE UNI LEFT INC AXILLA; Future - US BREAST COMPLETE UNI RIGHT INC AXILLA; Future  Return if symptoms worsen or fail to improve. ___________________________________________ Thayer Ohm, DNP, APRN, FNP-BC Primary Care and Sports Medicine [facility]

## 2021-03-31 ENCOUNTER — Encounter: Payer: Self-pay | Admitting: Rehabilitative and Restorative Service Providers"

## 2021-03-31 ENCOUNTER — Ambulatory Visit: Payer: 59 | Admitting: Rehabilitative and Restorative Service Providers"

## 2021-03-31 ENCOUNTER — Other Ambulatory Visit: Payer: Self-pay

## 2021-03-31 DIAGNOSIS — R2689 Other abnormalities of gait and mobility: Secondary | ICD-10-CM

## 2021-03-31 DIAGNOSIS — R29898 Other symptoms and signs involving the musculoskeletal system: Secondary | ICD-10-CM | POA: Diagnosis not present

## 2021-03-31 DIAGNOSIS — S93432A Sprain of tibiofibular ligament of left ankle, initial encounter: Secondary | ICD-10-CM | POA: Diagnosis not present

## 2021-03-31 DIAGNOSIS — R601 Generalized edema: Secondary | ICD-10-CM | POA: Diagnosis not present

## 2021-03-31 DIAGNOSIS — M6281 Muscle weakness (generalized): Secondary | ICD-10-CM | POA: Diagnosis not present

## 2021-03-31 DIAGNOSIS — M25562 Pain in left knee: Secondary | ICD-10-CM

## 2021-03-31 NOTE — Patient Instructions (Signed)
Access Code: [email or web address] URL: [email or web address] Date: 03/31/2021 Prepared by: Corlis Leak  Exercises Hooklying Hamstring Stretch with Strap - 2 x daily - 7 x weekly - 1 sets - 3 reps - 30 sec hold Supine ITB Stretch with Strap - 2 x daily - 7 x weekly - 1 sets - 3 reps - 30 sec hold Supine Piriformis Stretch with Leg Straight - 2 x daily - 7 x weekly - 1 sets - 3 reps - 30 sec hold Gastroc Stretch on Wall - 2 x daily - 7 x weekly - 1 sets - 3 reps - 30 sec hold Ankle Pumps in Elevation - 2 x daily - 7 x weekly - 1 sets - 10 reps - 1-2 sec hold Supine Ankle Inversion Eversion AROM - 2 x daily - 7 x weekly - 1 sets - 10 reps - 1-2 sec hold Ankle Circles in Elevation - 2 x daily - 7 x weekly - 1 sets - 10-15 reps Ankle Alphabet in Elevation - 2 x daily - 7 x weekly - 1 sets - 1-2 reps Single Leg Stance with Support - 2 x daily - 7 x weekly - 1 sets - 3 reps - 30 sec hold

## 2021-03-31 NOTE — Therapy (Signed)
[facility] [place] [address] [place], [place], [address] Phone: [phone]   Fax:  [phone]  Physical Therapy Evaluation  Patient Details  Name: Erica Boyer MRN: [id number] Date of Birth: 24-Feb-1986 Referring Provider (PT): Dr Benjamin Stain   Encounter Date: 03/31/2021   PT End of Session - 03/31/21 1741     Visit Number 1    Number of Visits 12    Date for PT Re-Evaluation 05/12/21    PT Start Time 0930    PT Stop Time 1022    PT Time Calculation (min) 52 min    Activity Tolerance Patient tolerated treatment well             Past Medical History:  Diagnosis Date   Asthma    Fructose intolerance    GERD (gastroesophageal reflux disease)    Lactose intolerance     Past Surgical History:  Procedure Laterality Date   NO PAST SURGERIES      There were no vitals filed for this visit.    Subjective Assessment - 03/31/21 0933     Subjective Patient reports that she danced too much at a friends wedding 03/01/21 and then turned ankle two days later. She has had continued pain in the Lt ankle and knee.    Pertinent History anxiety, depression; asthma; allergies; OA bilat knees    Patient Stated Goals get rid of the pain    Currently in Pain? Yes    Pain Score 4     Pain Location Knee    Pain Orientation Left    Pain Descriptors / Indicators Aching    Pain Type Acute pain    Pain Onset More than a month ago    Pain Frequency Intermittent    Aggravating Factors  walking; steps; working 12 hour shift standing/up and down/bending; sitting with Lt LE tucked under Rt; crossing ankles while sitting    Pain Relieving Factors ice; elevation; meds    Pain Score 5    Pain Location Ankle    Pain Orientation Left    Pain Descriptors / Indicators Sharp    Pain Type Acute pain    Pain Onset More than a month ago    Pain Frequency Intermittent    Aggravating Factors  standing; walking; steps; turning ankle inward;  barefoot    Pain Relieving Factors ice; elevation; meds; being off feet for a bit                [facility] PT Assessment - 03/31/21 0001       Assessment   Medical Diagnosis Lt patellofemoral syndrome; Lt ankle sprain    Referring Provider (PT) Dr Benjamin Stain    Onset Date/Surgical Date 03/01/21    Hand Dominance Right    Next MD Visit 04/21/21    Prior Therapy yes for LB      Precautions   Precautions None      Restrictions   Weight Bearing Restrictions No      Balance Screen   Has the patient fallen in the past 6 months No    Has the patient had a decrease in activity level because of a fear of falling?  No    Is the patient reluctant to leave their home because of a fear of falling?  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      Posture/Postural Control   Posture Comments head forward; shoulders rounded; knees hyperextended Lt > Rt; Lt LE in ER with foot turned out      AROM   Right Knee Extension 5    Right Knee Flexion 121    Left Knee Extension 7    Left Knee Flexion 108    Right/Left Ankle --   pain Lt ankle DF; inv; ever   Right Ankle Dorsiflexion 12    Right Ankle Plantar Flexion 48    Right Ankle Inversion 38    Right Ankle Eversion 26    Left Ankle Dorsiflexion 2    Left Ankle Plantar Flexion 45    Left Ankle Inversion 26    Left Ankle Eversion 18      Strength   Left Hip ABduction 4+/5    Right  Knee Flexion 5/5    Right Knee Extension 5/5    Left Knee Flexion 4+/5    Left Knee Extension 4+/5    Right Ankle Plantar Flexion 5/5    Right Ankle Inversion 5/5    Right Ankle Eversion 5/5    Left Ankle Dorsiflexion 4+/5    Left Ankle Plantar Flexion 4+/5   pain in Lt ankle and posterior knee   Left Ankle Inversion 4+/5    Left Ankle Eversion 4/5   pain     Flexibility   Hamstrings tight Lt > Rt    ITB tight Lt > Rt    Piriformis tight Lt > Rt      Palpation   Patella mobility tenderness to palpation Lt peripatellar area laterally > medially into the patellar tendon    Palpation comment tightness and tenderness lateral Lt ankle through ATF/posterior to [facility]      Ambulation/Gait   Gait Comments antalgic gait limp Lt LE in wt bearing      Balance   Balance Assessed --   SLS 10 sec bilat sore in Lt ankle                       Objective measurements completed on examination: See above findings.       [facility] Adult PT Treatment/Exercise - 03/31/21 0001       Self-Care   Self-Care Other Self-Care Comments    Other Self-Care Comments  AFO creates increased swelling in the Lt ankle and increases ankle pain - suggested patient try support socks to help with edema and provide some support while standing and walking especially for 12 hour shifts      Knee/Hip Exercises: Stretches   Passive Hamstring Stretch Left;2 reps;30 seconds    Passive Hamstring Stretch Limitations supine with strap    ITB Stretch Left;2 reps;30 seconds    ITB Stretch Limitations supine with strap    Piriformis Stretch Left;2 reps;30 seconds    Piriformis Stretch Limitations supine travell with strap proximal to knee  Gastroc Stretch Left;2 reps;30 seconds    Soleus Stretch Left;1 rep;30 seconds      Knee/Hip Exercises: Standing   SLS 10-15 sec x 2 reps Lt LE      Cryotherapy   Number Minutes Cryotherapy 10 Minutes    Cryotherapy Location Knee;Ankle    Type of Cryotherapy Ice  pack      Ankle Exercises: Seated   ABC's 1 rep    Ankle Circles/Pumps AROM;Left;10 reps    Ankle Circles/Pumps Limitations DF/PF; Inv/Ever; circles CW/CCW                     PT Education - 03/31/21 1013     Education Details HEP    Person(s) Educated Patient    Methods Explanation;Demonstration;Tactile cues;Verbal cues;Handout    Comprehension Verbalized understanding;Returned demonstration;Verbal cues required;Tactile cues required                 PT Long Term Goals - 03/31/21 1751       PT LONG TERM GOAL #1   Title Decrease pain in the Lt knee and ankle by 75-100% allowing patient to return to all normal functional and work activities    Time 6    Period Weeks    Status New    Target Date 05/12/21      PT LONG TERM GOAL #2   Title Increase AROM Lt ankle to equal of greater than AROM Rt ankle    Time 6    Period Weeks    Status New    Target Date 05/12/21      PT LONG TERM GOAL #3   Title 5/5 strength Lt LE    Time 6    Period Weeks    Status New    Target Date 05/12/21      PT LONG TERM GOAL #4   Title Normal gait pattern for all ambulation including stairs    Time 6    Period Weeks    Status New    Target Date 05/12/21      PT LONG TERM GOAL #5   Title Independent in HEP    Time 6    Period Weeks    Status New    Target Date 05/12/21      PT LONG TERM GOAL #6   Title Increase functional limitation score to 69    Time 6    Period Weeks    Status New    Target Date 05/12/21                    Plan - 03/31/21 1742     Clinical Impression Statement Patient presents s/p Lt ankle sprain ~ 4 weeks ago. She reports pain in the Lt ankle and peripatellar pain in the Lt knee following the sprain with no improvement in symptoms. Patient has muscular tightness; decreased ROM and strength in Lt LE including hip, knee and ankle; abnormal gait; decreased balance; pain with standing; walking and functional activities. 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 Patient stands with knees hyperextended. She will benefit from PT to address problems identified.    Stability/Clinical Decision Making Stable/Uncomplicated    Clinical Decision Making Low    Rehab Potential Good    PT Frequency 2x / week    PT Duration 6 weeks    PT Treatment/Interventions ADLs/Self Care  Home Management;Aquatic Therapy;Cryotherapy;Electrical Stimulation;Iontophoresis 4mg /ml Dexamethasone;Moist Heat;Ultrasound;Gait training;Stair training;Functional mobility training;Therapeutic activities;Therapeutic exercise;Balance training;Neuromuscular re-education;Patient/family education;Manual techniques;Passive range of motion;Dry needling;Vasopneumatic Device;Taping    PT Next Visit Plan review and progress exercises for Lt LE including stretching(hip flexors and soleus); strengthening; gait training; balance activities; modalities and taping as indicated.    PT Home Exercise Plan [email or web address]    Consulted and Agree with Plan of Care Patient             Patient will benefit from skilled therapeutic intervention in order to improve the following deficits and impairments:  Abnormal gait, Decreased range of motion, Obesity, Decreased activity tolerance, Pain, Decreased balance, Impaired flexibility, Improper body mechanics, Decreased mobility, Decreased strength, Increased edema, Postural dysfunction  Visit Diagnosis: Sprain of tibiofibular ligament of left ankle, initial encounter  Acute pain of left knee  Other symptoms and signs involving the musculoskeletal system  Muscle weakness (generalized)  Other abnormalities of gait and mobility  Generalized edema     Problem List Patient Active Problem List   Diagnosis Date Noted   Patellofemoral syndrome, left 03/09/2021   Left ankle sprain 03/09/2021   Anxiety with depression 02/02/2021   Migraines 03/29/2006    Asthma 11/27/2003    Kamilla Hands 11/29/2003, PT, MPH  03/31/2021, 5:56 PM  [facility] [address] [place], [place], [place] Phone: [phone]   Fax:  [phone]  Name: SUSA BONES MRN: Drema Dallas Date of Birth: Jun 06, 1986

## 2021-04-01 ENCOUNTER — Encounter: Payer: Self-pay | Admitting: Medical-Surgical

## 2021-04-05 ENCOUNTER — Encounter: Payer: Self-pay | Admitting: Medical-Surgical

## 2021-04-06 NOTE — Telephone Encounter (Signed)
Erica Boyer has been scheduled at [facility] [address] [place], [place] [address] at 12:45 on Wednesday October 26 th. Patient is aware.

## 2021-04-07 ENCOUNTER — Other Ambulatory Visit: Payer: Self-pay

## 2021-04-07 ENCOUNTER — Encounter: Payer: Self-pay | Admitting: Physical Therapy

## 2021-04-07 ENCOUNTER — Ambulatory Visit (INDEPENDENT_AMBULATORY_CARE_PROVIDER_SITE_OTHER): Payer: 59 | Admitting: Physical Therapy

## 2021-04-07 DIAGNOSIS — M25562 Pain in left knee: Secondary | ICD-10-CM | POA: Diagnosis not present

## 2021-04-07 DIAGNOSIS — R29898 Other symptoms and signs involving the musculoskeletal system: Secondary | ICD-10-CM | POA: Diagnosis not present

## 2021-04-07 DIAGNOSIS — S93432A Sprain of tibiofibular ligament of left ankle, initial encounter: Secondary | ICD-10-CM

## 2021-04-07 DIAGNOSIS — M6281 Muscle weakness (generalized): Secondary | ICD-10-CM | POA: Diagnosis not present

## 2021-04-07 NOTE — Therapy (Signed)
[facility] [place] [address] [place], [place], [address] Phone: [phone]   Fax:  [phone]  Physical Therapy Treatment  Patient Details  Name: Erica Boyer MRN: [id number] Date of Birth: 28-Feb-1986 Referring Provider (PT): Dr Benjamin Stain   Encounter Date: 04/07/2021   PT End of Session - 04/07/21 0900     Visit Number 2    Number of Visits 12    Date for PT Re-Evaluation 05/12/21    PT Start Time 0849    PT Stop Time 0928    PT Time Calculation (min) 39 min    Activity Tolerance Patient tolerated treatment well    Behavior During Therapy [facility] for tasks assessed/performed             Past Medical History:  Diagnosis Date   Asthma    Fructose intolerance    GERD (gastroesophageal reflux disease)    Lactose intolerance     Past Surgical History:  Procedure Laterality Date   NO PAST SURGERIES      There were no vitals filed for this visit.   Subjective Assessment - 04/07/21 0858     Subjective Pt reports tightness in Lt hip today.  she has been doing inversion/eversion often when she is sitting.  She notices Lt hip pain in afternoons during work.    Currently in Pain? No/denies    Pain Score 0-No pain                [facility] PT Assessment - 04/07/21 0001       Assessment   Medical Diagnosis Lt patellofemoral syndrome; Lt ankle sprain    Referring Provider (PT) Dr Benjamin Stain    Onset Date/Surgical Date 03/01/21    Hand Dominance Right    Next MD Visit 04/21/21    Prior Therapy yes for LB              [facility] Adult PT Treatment/Exercise - 04/07/21 0001       Knee/Hip Exercises: Stretches   Passive Hamstring Stretch Left;2 reps;20 seconds;30 seconds   supine with strap and seated, hip hinge   Hip Flexor Stretch Left;2 reps;20 seconds   seated, leg off side of chair.   ITB Stretch Left;2 reps;20 seconds   supine with strap   Piriformis Stretch Left;2 reps;20 seconds   supine, travell  with strap assist   Gastroc Stretch Left;2 reps;Right;1 rep;30 seconds    Soleus Stretch Left;1 rep;30 seconds    Other Knee/Hip Stretches Lt plantar fascia stretch x 20 sec      Knee/Hip Exercises: Aerobic   Nustep L4-5 x 5 min for warm up (legs only)      Knee/Hip Exercises: Standing   SLS LLE, 7 and 17 sec without UE supporty.  then 30 sec LLE.    Other Standing Knee Exercises and Lt ankle circles when in R SLS (CW/CCW)      Knee/Hip Exercises: Seated   Other Seated Knee/Hip Exercises inversion/eversion ROM x 10      Manual Therapy   Manual Therapy Taping    Kinesiotex Edema      Kinesiotix   Edema I strip of reg Rock tape applied in stirup pattern and crossing at talus.  2nd piece applied to Lt ant/lateral ankle with 10-20% stretch.                     PT Education - 04/07/21 5701     Education Details HEP, added seated piriformis.  Verbally reviewed safe tape removal and tape rationale.    Person(s) Educated Patient    Methods Explanation;Verbal cues;Handout    Comprehension Verbalized understanding;Returned demonstration                 PT Long Term Goals - 03/31/21 1751       PT LONG TERM GOAL #1   Title Decrease pain in the Lt knee and ankle by 75-100% allowing patient to return to all normal functional and work activities    Time 6    Period Weeks    Status New    Target Date 05/12/21      PT LONG TERM GOAL #2   Title Increase AROM Lt ankle to equal of greater than AROM Rt ankle    Time 6    Period Weeks    Status New    Target Date 05/12/21      PT LONG TERM GOAL #3   Title 5/5 strength Lt LE    Time 6    Period Weeks    Status New    Target Date 05/12/21      PT LONG TERM GOAL #4   Title Normal gait pattern for all ambulation including stairs    Time 6    Period Weeks    Status New    Target Date 05/12/21      PT LONG TERM GOAL #5   Title Independent in HEP    Time 6    Period Weeks    Status New    Target Date 05/12/21       PT LONG TERM GOAL #6   Title Increase functional limitation score to 69    Time 6    Period Weeks    Status New    Target Date 05/12/21                   Plan - 04/07/21 1035     Clinical Impression Statement Positive response to HEP stretches thus far; reporting less pain and tightness. Reviewed HEP and added additional versions of stretches.  Trial of ktape to Lt lateral ankle to increase proprioception and decrease swelling. Goals are ongoing.    Stability/Clinical Decision Making Stable/Uncomplicated    Rehab Potential Good    PT Frequency 2x / week    PT Duration 6 weeks    PT Treatment/Interventions ADLs/Self Care Home Management;Aquatic Therapy;Cryotherapy;Electrical Stimulation;Iontophoresis 4mg /ml Dexamethasone;Moist Heat;Ultrasound;Gait training;Stair training;Functional mobility training;Therapeutic activities;Therapeutic exercise;Balance training;Neuromuscular re-education;Patient/family education;Manual techniques;Passive range of motion;Dry needling;Vasopneumatic Device;Taping    PT Next Visit Plan progress exercises for Lt LE including stretching(hip flexors and soleus); strengthening; gait training; balance activities; modalities and taping as indicated.    PT Home Exercise Plan [email or web address]    Consulted and Agree with Plan of Care Patient             Patient will benefit from skilled therapeutic intervention in order to improve the following deficits and impairments:  Abnormal gait, Decreased range of motion, Obesity, Decreased activity tolerance, Pain, Decreased balance, Impaired flexibility, Improper body mechanics, Decreased mobility, Decreased strength, Increased edema, Postural dysfunction  Visit Diagnosis: Sprain of tibiofibular ligament of left ankle, initial encounter  Acute pain of left knee  Other symptoms and signs involving the musculoskeletal system  Muscle weakness (generalized)     Problem List Patient Active Problem List    Diagnosis Date Noted   Patellofemoral syndrome, left 03/09/2021   Left ankle sprain 03/09/2021   Anxiety with depression 02/02/2021  Migraines 03/29/2006   Asthma 11/27/2003   Mayer Camel, PTA 04/07/21 1:06 PM  [facility] [address] [place], [place], [address] Phone: [phone]   Fax:  [phone]  Name: Erica Boyer MRN: [id number] Date of Birth: 1985-10-28

## 2021-04-07 NOTE — Patient Instructions (Signed)
Access Code: [email or web address] URL: [email or web address] Date: 04/07/2021 Prepared by: [facility]  Exercises Hooklying Hamstring Stretch with Strap - 2 x daily - 7 x weekly - 1 sets - 3 reps - 30 sec hold Supine ITB Stretch with Strap - 2 x daily - 7 x weekly - 1 sets - 3 reps - 30 sec hold Supine Piriformis Stretch with Leg Straight - 2 x daily - 7 x weekly - 1 sets - 3 reps - 30 sec hold Gastroc Stretch on Wall - 2 x daily - 7 x weekly - 1 sets - 3 reps - 30 sec hold Ankle Pumps in Elevation - 2 x daily - 7 x weekly - 1 sets - 10 reps - 1-2 sec hold Supine Ankle Inversion Eversion AROM - 2 x daily - 7 x weekly - 1 sets - 10 reps - 1-2 sec hold Ankle Circles in Elevation - 2 x daily - 7 x weekly - 1 sets - 10-15 reps Ankle Alphabet in Elevation - 2 x daily - 7 x weekly - 1 sets - 1-2 reps Single Leg Stance with Support - 2 x daily - 7 x weekly - 1 sets - 3 reps - 30 sec hold Seated Piriformis Stretch with Trunk Bend - 2 x daily - 7 x weekly - 1 sets - 2 reps - 15-30 hold Seated Piriformis Stretch - 2 x daily - 7 x weekly - 1 sets - 2 reps - 15-30 hold

## 2021-04-10 ENCOUNTER — Encounter: Payer: 59 | Admitting: Physical Therapy

## 2021-04-10 DIAGNOSIS — R590 Localized enlarged lymph nodes: Secondary | ICD-10-CM | POA: Diagnosis not present

## 2021-04-10 DIAGNOSIS — R922 Inconclusive mammogram: Secondary | ICD-10-CM | POA: Diagnosis not present

## 2021-04-10 DIAGNOSIS — N644 Mastodynia: Secondary | ICD-10-CM | POA: Diagnosis not present

## 2021-04-10 DIAGNOSIS — M79621 Pain in right upper arm: Secondary | ICD-10-CM | POA: Diagnosis not present

## 2021-04-10 DIAGNOSIS — N6315 Unspecified lump in the right breast, overlapping quadrants: Secondary | ICD-10-CM | POA: Diagnosis not present

## 2021-04-14 ENCOUNTER — Encounter: Payer: 59 | Admitting: Physical Therapy

## 2021-04-17 ENCOUNTER — Other Ambulatory Visit: Payer: Self-pay

## 2021-04-17 ENCOUNTER — Emergency Department
Admission: EM | Admit: 2021-04-17 | Discharge: 2021-04-17 | Disposition: A | Discharge status: Home / Self Care | Payer: 59 | Source: Home / Self Care

## 2021-04-17 ENCOUNTER — Ambulatory Visit: Payer: 59 | Admitting: Physical Therapy

## 2021-04-17 ENCOUNTER — Encounter: Payer: Self-pay | Admitting: Emergency Medicine

## 2021-04-17 DIAGNOSIS — R601 Generalized edema: Secondary | ICD-10-CM | POA: Diagnosis not present

## 2021-04-17 DIAGNOSIS — J029 Acute pharyngitis, unspecified: Secondary | ICD-10-CM | POA: Diagnosis not present

## 2021-04-17 DIAGNOSIS — S93432A Sprain of tibiofibular ligament of left ankle, initial encounter: Secondary | ICD-10-CM

## 2021-04-17 DIAGNOSIS — R29898 Other symptoms and signs involving the musculoskeletal system: Secondary | ICD-10-CM

## 2021-04-17 DIAGNOSIS — M6281 Muscle weakness (generalized): Secondary | ICD-10-CM

## 2021-04-17 DIAGNOSIS — R2689 Other abnormalities of gait and mobility: Secondary | ICD-10-CM

## 2021-04-17 DIAGNOSIS — M25562 Pain in left knee: Secondary | ICD-10-CM

## 2021-04-17 DIAGNOSIS — J039 Acute tonsillitis, unspecified: Secondary | ICD-10-CM

## 2021-04-17 MED ORDER — PREDNISONE 20 MG PO TABS: ORAL_TABLET | ORAL | 0 refills | Status: DC

## 2021-04-17 MED ORDER — CEFDINIR 300 MG PO CAPS: 300.0000 mg | ORAL_CAPSULE | Freq: Two times a day (BID) | ORAL | 0 refills | Status: AC

## 2021-04-17 NOTE — Therapy (Addendum)
[place], [place], [address] Phone: [phone]   Fax:  [phone]  Physical Therapy Treatment and Discharge  Patient Details  Name: Erica Boyer MRN: [id number] Date of Birth: 07-01-1985 Referring Provider (PT): Dr Dianah Field   Encounter Date: 04/17/2021   PT End of Session - 04/17/21 1444     Visit Number 3    Number of Visits 12    Date for PT Re-Evaluation 05/12/21    PT Start Time 1400    PT Stop Time 1443    PT Time Calculation (min) 43 min    Activity Tolerance Patient tolerated treatment well    Behavior During Therapy [facility] for tasks assessed/performed             Past Medical History:  Diagnosis Date   Asthma    Fructose intolerance    GERD (gastroesophageal reflux disease)    Lactose intolerance     Past Surgical History:  Procedure Laterality Date   NO PAST SURGERIES      There were no vitals filed for this visit.   Subjective Assessment - 04/17/21 1402     Subjective Pt states "my knee feels fine as long as I am not at work or at clinicals"    Patient Stated Goals get rid of the pain    Currently in Pain? Yes    Pain Score 4     Pain Location Knee    Pain Orientation Left    Pain Descriptors / Indicators Aching;Sore                [facility] PT Assessment - 04/17/21 0001       Assessment   Medical Diagnosis Lt patellofemoral syndrome; Lt ankle sprain    Referring Provider (PT) Dr Dianah Field    Onset Date/Surgical Date 03/01/21    Hand Dominance Right    Next MD Visit 04/21/21    Prior Therapy yes for LB      AROM   Right Knee Extension 0    Left Knee Extension 0    Left Ankle Dorsiflexion 3                           [facility] Adult PT Treatment/Exercise - 04/17/21 0001       Exercises   Exercises Knee/Hip      Knee/Hip Exercises: Stretches   Passive Hamstring Stretch Left;2 reps;30 seconds    Passive Hamstring Stretch  Limitations supine with strap    Hip Flexor Stretch Left;2 reps;20 seconds   seated, leg off side of chair.   ITB Stretch Left;2 reps;20 seconds   supine with strap   Piriformis Stretch Left;2 reps;20 seconds   supine, travell with strap assist   Gastroc Stretch Left;2 reps;20 seconds    Soleus Stretch Left;2 reps;20 seconds      Knee/Hip Exercises: Aerobic   Recumbent Bike L1 x 5 min for warm up      Knee/Hip Exercises: Machines for Strengthening   Total Gym Leg Press 4 plates x 10 bilat LE focus on eccentric control      Knee/Hip Exercises: Standing   Lateral Step Up Left;Hand Hold: 0;Step Height: 4";10 reps    Forward Step Up Left;10 reps;Hand Hold: 0;Step Height: 6"    Step Down Left;10 reps;Hand Hold: 0;Step Height: 4"    Step Down Limitations laterally    Rocker Board 1 minute   x 2  Rocker Board Limitations A/P and then laterally, intermittent UE support    SLS > 30 sec bilat without UE support, then SLS on foam with intermittent UE support 30 sec bilat    SLS with Vectors x 10 bilat                          PT Long Term Goals - 03/31/21 1751       PT LONG TERM GOAL #1   Title Decrease pain in the Lt knee and ankle by 75-100% allowing patient to return to all normal functional and work activities    Time 6    Period Weeks    Status New    Target Date 05/12/21      PT LONG TERM GOAL #2   Title Increase AROM Lt ankle to equal of greater than AROM Rt ankle    Time 6    Period Weeks    Status New    Target Date 05/12/21      PT LONG TERM GOAL #3   Title 5/5 strength Lt LE    Time 6    Period Weeks    Status New    Target Date 05/12/21      PT LONG TERM GOAL #4   Title Normal gait pattern for all ambulation including stairs    Time 6    Period Weeks    Status New    Target Date 05/12/21      PT LONG TERM GOAL #5   Title Independent in HEP    Time 6    Period Weeks    Status New    Target Date 05/12/21      PT LONG TERM GOAL #6   Title  Increase functional limitation score to 69    Time 6    Period Weeks    Status New    Target Date 05/12/21                   Plan - 04/17/21 1444     Clinical Impression Statement Pt with decreased pain and improved ROM. She was able to tolerate progression to standing and balance exercises without increase in pain. Pt continues with "tightness" with eccentric step downs and with prolonged SLS and balance challenges    PT Next Visit Plan update HEP, progress balance and strengthening, eccentrics as tolerated    PT Home Exercise Plan [id number]    Consulted and Agree with Plan of Care Patient             Patient will benefit from skilled therapeutic intervention in order to improve the following deficits and impairments:     Visit Diagnosis: Sprain of tibiofibular ligament of left ankle, initial encounter  Acute pain of left knee  Other symptoms and signs involving the musculoskeletal system  Muscle weakness (generalized)  Other abnormalities of gait and mobility  Generalized edema     Problem List Patient Active Problem List   Diagnosis Date Noted   Patellofemoral syndrome, left 03/09/2021   Left ankle sprain 03/09/2021   Anxiety with depression 02/02/2021   Migraines 03/29/2006   Asthma 11/27/2003   PHYSICAL THERAPY DISCHARGE SUMMARY  Visits from Start of Care: 3  Current functional level related to goals / functional outcomes: Improved strength and ROM   Remaining deficits: Pain with eccentrics   Education / Equipment: HEP   Patient agrees to discharge. Patient goals were partially met. Patient is  being discharged due to not returning since the last visit. Isabelle Course, PT,DPT11/29/2210:33 AM   Isabelle Course, PT 04/17/2021, 2:46 PM  [facility] [place], [place], [address] Phone: [phone]   Fax:  [phone]  Name: Erica Boyer MRN: [id number] Date of  Birth: 04/01/1986

## 2021-04-17 NOTE — ED Provider Notes (Signed)
[facility]    CSN: [id number] Arrival date & time: 04/17/21  1453      History   Chief Complaint Chief Complaint  Patient presents with   Otalgia   Sore Throat    HPI Erica Boyer is a 35 y.o. female.   HPI 35 year old female presents with left ear pain for 4 days and sore throat.  Patient denies fever.  Past Medical History:  Diagnosis Date   Asthma    Fructose intolerance    GERD (gastroesophageal reflux disease)    Lactose intolerance     Patient Active Problem List   Diagnosis Date Noted   Patellofemoral syndrome, left 03/09/2021   Left ankle sprain 03/09/2021   Anxiety with depression 02/02/2021   Migraines 03/29/2006   Asthma 11/27/2003    Past Surgical History:  Procedure Laterality Date   NO PAST SURGERIES      OB History   No obstetric history on file.      Home Medications    Prior to Admission medications   Medication Sig Start Date End Date Taking? Authorizing Provider  cefdinir (OMNICEF) 300 MG capsule Take 1 capsule (300 mg total) by mouth 2 (two) times daily for 7 days. 04/17/21 04/24/21 Yes Trevor Iha, FNP  predniSONE (DELTASONE) 20 MG tablet Take 3 tabs PO daily x 5 days. 04/17/21  Yes Trevor Iha, FNP  albuterol (VENTOLIN HFA) 108 (90 Base) MCG/ACT inhaler Inhale 2 puffs into the lungs every 4 (four) hours as needed for wheezing or shortness of breath. 10/07/20   Christen Butter, NP  escitalopram (LEXAPRO) 10 MG tablet Take 1 tablet (10 mg total) by mouth daily. 02/02/21   Christen Butter, NP  etonogestrel-ethinyl estradiol (NUVARING) 0.12-0.015 MG/24HR vaginal ring INSERT 1 RING VAGINALLY AS DIRECTED. REMOVE AFTER 3 WEEKS & WAIT 7 DAYS BEFORE INSERTING A NEW RING 01/26/21   Christen Butter, NP  fluticasone (FLONASE) 50 MCG/ACT nasal spray Place into both nostrils daily. Patient not taking: Reported on 04/17/2021    [provider]  hydrOXYzine (ATARAX/VISTARIL) 25 MG tablet Take 1/2 to 2 tablets (12.5-50 mg total) by  mouth 3 times daily as needed. 02/02/21   Christen Butter, NP  levocetirizine (XYZAL) 5 MG tablet Take 1 tablet (5 mg total) by mouth every evening. 11/14/20   Christen Butter, NP  meloxicam (MOBIC) 15 MG tablet Take 1 tabelt by mouth every morning with a meal for 2 weeks, then daily as needed pain. 03/09/21   Monica Becton, MD  omeprazole (PRILOSEC) 40 MG capsule Take 1 capsule (40 mg total) by mouth daily. 10/07/20   Christen Butter, NP  Vitamin D, Ergocalciferol, (DRISDOL) 1.25 MG (50000 UNIT) CAPS capsule Take 1 capsule (50,000 Units total) by mouth once a week. 06/12/20       Family History Family History  Problem Relation Age of Onset   Hypertension Mother    Sleep apnea Mother    Hyperlipidemia Father    Heart failure Father    Esophageal cancer Father    Hypertension Other    Heart attack Other    Glaucoma Other    Cataracts Other    Dementia Other    Diabetes Other    Stroke Other    Esophageal cancer Other     Social History Social History   Tobacco Use   Smoking status: Never   Smokeless tobacco: Never  Vaping Use   Vaping Use: Never used  Substance Use Topics   Alcohol use: Yes  Comment: Rarely   Drug use: Never     Allergies   Cephalexin, Mometasone, Moxifloxacin, Sumatriptan, and Sumatriptan-naproxen sodium   Review of Systems Review of Systems  HENT:  Positive for ear pain and sore throat.   All other systems reviewed and are negative.   Physical Exam Triage Vital Signs ED Triage Vitals  Enc Vitals Group     BP 04/17/21 1541 100/65     Pulse Rate 04/17/21 1541 78     Resp 04/17/21 1541 16     Temp 04/17/21 1541 98.4 F (36.9 C)     Temp Source 04/17/21 1541 Oral     SpO2 04/17/21 1541 99 %     Weight 04/17/21 1542 219 lb (99.3 kg)     Height 04/17/21 1542 5' 1.25" (1.556 m)     Head Circumference --      Peak Flow --      Pain Score 04/17/21 1542 6     Pain Loc --      Pain Edu? --      Excl. in GC? --    No data found.  Updated Vital  Signs BP 100/65 (BP Location: Right Arm)   Pulse 78   Temp 98.4 F (36.9 C) (Oral)   Resp 16   Ht 5' 1.25" (1.556 m)   Wt 219 lb (99.3 kg)   LMP 03/21/2021 (Exact Date)   SpO2 99%   BMI 41.04 kg/m      Physical Exam   UC Treatments / Results  Labs (all labs ordered are listed, but only abnormal results are displayed) Labs Reviewed - No data to display  EKG   Radiology No results found.  Procedures Procedures (including critical care time)  Medications Ordered in UC Medications - No data to display  Initial Impression / Assessment and Plan / UC Course  I have reviewed the triage vital signs and the nursing notes.  Pertinent labs & imaging results that were available during my care of the patient were reviewed by me and considered in my medical decision making (see chart for details).     MDM: 1.  Tonsillitis-Rx'd Cefdinir and Prednisone burst. Advised patient to take medication as directed with food to completion.  Advised patient to take prednisone with first dose of antibiotic for 5 of 7 days.  Encouraged patient increase daily water intake while taking these medications.  Patient discharged home, hemodynamically stable. Final Clinical Impressions(s) / UC Diagnoses   Final diagnoses:  Tonsillitis     Discharge Instructions      Advised patient to take medication as directed with food to completion.  Advised patient to take prednisone with first dose of antibiotic for 5 of 7 days.  Encourage patient increase daily water intake while taking these medications.     ED Prescriptions     Medication Sig Dispense Auth. Provider   cefdinir (OMNICEF) 300 MG capsule Take 1 capsule (300 mg total) by mouth 2 (two) times daily for 7 days. 14 capsule Trevor Iha, FNP   predniSONE (DELTASONE) 20 MG tablet Take 3 tabs PO daily x 5 days. 15 tablet Trevor Iha, FNP      PDMP not reviewed this encounter.   Trevor Iha, FNP 04/17/21 1620

## 2021-04-17 NOTE — ED Triage Notes (Signed)
Left ear pain x 4 days OTC Tylenol q 4 hours - no fever Last tylenol at 1300 Sore throat  Hurts to swallow  Feels lik e" I have been hit by a bus" Flu vaccine 9/22 COVID - bivalent vaccine  10/22

## 2021-04-17 NOTE — Discharge Instructions (Addendum)
Advised patient to take medication as directed with food to completion.  Advised patient to take prednisone with first dose of antibiotic for 5 of 7 days.  Encouraged patient increase daily water intake while taking these medications.

## 2021-04-21 ENCOUNTER — Ambulatory Visit: Payer: 59 | Admitting: Sports Medicine

## 2021-05-13 ENCOUNTER — Other Ambulatory Visit: Payer: 59

## 2021-05-19 ENCOUNTER — Encounter: Payer: Self-pay | Admitting: Family Medicine

## 2021-05-19 ENCOUNTER — Other Ambulatory Visit: Payer: Self-pay

## 2021-05-19 ENCOUNTER — Ambulatory Visit: Payer: 59 | Admitting: Family Medicine

## 2021-05-19 VITALS — BP 115/81 | HR 77 | Temp 97.6°F | Ht 61.0 in | Wt 223.1 lb

## 2021-05-19 DIAGNOSIS — H6690 Otitis media, unspecified, unspecified ear: Secondary | ICD-10-CM | POA: Diagnosis not present

## 2021-05-19 MED ORDER — AMOXICILLIN 875 MG PO TABS: 875.0000 mg | ORAL_TABLET | Freq: Two times a day (BID) | ORAL | 0 refills | Status: AC

## 2021-05-19 NOTE — Patient Instructions (Signed)
Start antibiotics for your ear infection Continue alternating tylenol and ibuprofen as needed Warm compresses Hydration, rest, warm liquids   Please contact office for follow-up if symptoms do not improve or worsen. Seek emergency care if symptoms become severe.

## 2021-05-19 NOTE — Progress Notes (Signed)
Acute Office Visit  Subjective:    Patient ID: Erica Boyer, female    DOB: 04-18-1986, 35 y.o.   MRN: [id number]  Chief Complaint  Patient presents with   Otalgia   Jaw Pain    Otalgia   Patient is in today for ear pain radiating into jaw.   Patient reports about 6 days ago she started noticed some right ear pain.  States it has moved into her jaw/neck and feels like some of her lymph nodes are swollen and tender.  Reports her pain is sharp, 8/10 with minimal improvement with Tylenol and Advil.  She has been using a heating pad as well with minimal improvement.  States a few days ago her back right lower molar also started hurting as well although she denies any dental histories and reports good hygiene. She denies any oral swelling, drainage, or unusual odor/taste. States that her jaw is sore to chew. She has had some hot/cold spells, but hasn't checked her temperature. She started with an occasional try cough a few days, but does not feel congested. No other sinus pressure, headaches, fatigue, malaise.     Past Medical History:  Diagnosis Date   Asthma    Fructose intolerance    GERD (gastroesophageal reflux disease)    Lactose intolerance     Past Surgical History:  Procedure Laterality Date   NO PAST SURGERIES      Family History  Problem Relation Age of Onset   Hypertension Mother    Sleep apnea Mother    Hyperlipidemia Father    Heart failure Father    Esophageal cancer Father    Hypertension Other    Heart attack Other    Glaucoma Other    Cataracts Other    Dementia Other    Diabetes Other    Stroke Other    Esophageal cancer Other     Social History   Socioeconomic History   Marital status: Married    Spouse name: Not on file   Number of children: Not on file   Years of education: Not on file   Highest education level: Not on file  Occupational History   Not on file  Tobacco Use   Smoking status: Never   Smokeless tobacco: Never  Vaping  Use   Vaping Use: Never used  Substance and Sexual Activity   Alcohol use: Yes    Comment: Rarely   Drug use: Never   Sexual activity: Yes  Other Topics Concern   Not on file  Social History Narrative   Not on file   Social Determinants of Health   Financial Resource Strain: Not on file  Food Insecurity: Not on file  Transportation Needs: Not on file  Physical Activity: Not on file  Stress: Not on file  Social Connections: Not on file  Intimate Partner Violence: Not on file    Outpatient Medications Prior to Visit  Medication Sig Dispense Refill   albuterol (VENTOLIN HFA) 108 (90 Base) MCG/ACT inhaler Inhale 2 puffs into the lungs every 4 (four) hours as needed for wheezing or shortness of breath. 18 g 6   escitalopram (LEXAPRO) 10 MG tablet Take 1 tablet (10 mg total) by mouth daily. 90 tablet 1   etonogestrel-ethinyl estradiol (NUVARING) 0.12-0.015 MG/24HR vaginal ring INSERT 1 RING VAGINALLY AS DIRECTED. REMOVE AFTER 3 WEEKS & WAIT 7 DAYS BEFORE INSERTING A NEW RING 1 each 15   hydrOXYzine (ATARAX/VISTARIL) 25 MG tablet Take 1/2 to 2 tablets (12.5-50  mg total) by mouth 3 times daily as needed. 180 tablet 0   levocetirizine (XYZAL) 5 MG tablet Take 1 tablet (5 mg total) by mouth every evening. 90 tablet 3   meloxicam (MOBIC) 15 MG tablet Take 1 tabelt by mouth every morning with a meal for 2 weeks, then daily as needed pain. 30 tablet 3   omeprazole (PRILOSEC) 40 MG capsule Take 1 capsule (40 mg total) by mouth daily. 90 capsule 3   predniSONE (DELTASONE) 20 MG tablet Take 3 tabs PO daily x 5 days. 15 tablet 0   Vitamin D, Ergocalciferol, (DRISDOL) 1.25 MG (50000 UNIT) CAPS capsule Take 1 capsule (50,000 Units total) by mouth once a week. 4 capsule 3   fluticasone (FLONASE) 50 MCG/ACT nasal spray Place into both nostrils daily. (Patient not taking: Reported on 05/19/2021)     PFIZER COVID-19 VAC BIVALENT injection      No facility-administered medications prior to visit.     Allergies  Allergen Reactions   Cephalexin Hives, Itching and Shortness Of Breath   Mometasone Other (See Comments)    Blurred vision   Moxifloxacin Rash    dizziness   Sumatriptan Photosensitivity    Worsens migraine    Sumatriptan-Naproxen Sodium Photosensitivity    Review of systems: All review of systems negative except what is listed in the HPI      Objective:    Physical Exam Vitals reviewed.  Constitutional:      Appearance: Normal appearance.  HENT:     Right Ear: Ear canal normal. Tenderness present. There is no impacted cerumen. There is mastoid tenderness. Tympanic membrane is erythematous and bulging.     Left Ear: Tympanic membrane, ear canal and external ear normal. There is no impacted cerumen.     Nose: Nose normal.     Mouth/Throat:     Mouth: Mucous membranes are moist.     Pharynx: Oropharynx is clear. No oropharyngeal exudate or posterior oropharyngeal erythema.     Comments: No signs of dental abscess/infection  Eyes:     Extraocular Movements: Extraocular movements intact.     Conjunctiva/sclera: Conjunctivae normal.  Cardiovascular:     Rate and Rhythm: Normal rate and regular rhythm.     Heart sounds: Normal heart sounds.  Pulmonary:     Effort: Pulmonary effort is normal.     Breath sounds: Normal breath sounds.  Musculoskeletal:     Cervical back: Normal range of motion and neck supple.  Lymphadenopathy:     Cervical: Cervical adenopathy present.  Skin:    General: Skin is warm and dry.     Findings: No erythema or rash.  Neurological:     General: No focal deficit present.     Mental Status: She is alert and oriented to person, place, and time. Mental status is at baseline.  Psychiatric:        Mood and Affect: Mood normal.        Behavior: Behavior normal.        Thought Content: Thought content normal.        Judgment: Judgment normal.    BP 115/81 (BP Location: Left Arm, Patient Position: Sitting, Cuff Size: Large)   Pulse  77   Temp 97.6 F (36.4 C) (Oral)   Ht 5\' 1"  (1.549 m)   Wt 223 lb 1.3 oz (101.2 kg)   SpO2 98%   BMI 42.15 kg/m  Wt Readings from Last 3 Encounters:  05/19/21 223 lb 1.3 oz (101.2 kg)  04/17/21 219 lb (99.3 kg)  03/27/21 219 lb (99.3 kg)    There are no preventive care reminders to display for this patient.   There are no preventive care reminders to display for this patient.   No results found for: TSH Lab Results  Component Value Date   WBC 11.3 04/18/2017   HGB 12.6 04/18/2017   HCT 39 04/18/2017   PLT 331 04/18/2017   Lab Results  Component Value Date   NA 138 04/18/2017   K 4.6 04/18/2017   CO2 25 (A) 04/18/2017   BUN 8 04/18/2017   CREATININE 0.6 04/18/2017   ALKPHOS 91 04/18/2017   AST 15 04/18/2017   ALT 15 04/18/2017   ALBUMIN 4.5 04/18/2017   CALCIUM 9.9 04/18/2017   No results found for: CHOL No results found for: HDL No results found for: LDLCALC No results found for: TRIG No results found for: CHOLHDL No results found for: PVXY8A     Assessment & Plan:   1. Acute otitis media, unspecified otitis media type Start antibiotics for your ear infection Continue alternating tylenol and ibuprofen as needed Warm compresses Hydration, rest, warm liquids  - amoxicillin (AMOXIL) 875 MG tablet; Take 1 tablet (875 mg total) by mouth 2 (two) times daily for 7 days.  Dispense: 14 tablet; Refill: 0   Please contact office for follow-up if symptoms do not improve or worsen. Seek emergency care if symptoms become severe.  Lollie Marrow Reola Calkins, DNP, FNP-C

## 2021-05-23 ENCOUNTER — Other Ambulatory Visit (HOSPITAL_COMMUNITY): Payer: Self-pay

## 2021-06-06 DIAGNOSIS — Z20822 Contact with and (suspected) exposure to covid-19: Secondary | ICD-10-CM | POA: Diagnosis not present

## 2021-06-10 ENCOUNTER — Encounter: Payer: Self-pay | Admitting: Medical-Surgical

## 2021-06-11 ENCOUNTER — Emergency Department: Admit: 2021-06-11 | Discharge status: Absent without leave | Payer: Self-pay

## 2021-06-11 ENCOUNTER — Emergency Department
Admission: EM | Admit: 2021-06-11 | Discharge: 2021-06-11 | Disposition: A | Discharge status: Home / Self Care | Payer: 59 | Source: Home / Self Care

## 2021-06-11 DIAGNOSIS — J069 Acute upper respiratory infection, unspecified: Secondary | ICD-10-CM | POA: Diagnosis not present

## 2021-06-11 DIAGNOSIS — J4521 Mild intermittent asthma with (acute) exacerbation: Secondary | ICD-10-CM | POA: Diagnosis not present

## 2021-06-11 LAB — POCT RAPID STREP A (OFFICE): Rapid Strep A Screen: NEGATIVE

## 2021-06-11 MED ORDER — METHYLPREDNISOLONE SODIUM SUCC 125 MG IJ SOLR
60.0000 mg | Freq: Once | INTRAMUSCULAR | Status: AC
  Administered 2021-06-11: 16:00:00 60 mg via INTRAMUSCULAR

## 2021-06-11 MED ORDER — PREDNISONE 10 MG (21) PO TBPK: ORAL_TABLET | ORAL | 0 refills | Status: DC

## 2021-06-11 MED ORDER — ALBUTEROL SULFATE HFA 108 (90 BASE) MCG/ACT IN AERS
4.0000 | INHALATION_SPRAY | Freq: Once | RESPIRATORY_TRACT | Status: AC
  Administered 2021-06-11: 16:00:00 4 via RESPIRATORY_TRACT

## 2021-06-11 MED ORDER — PROMETHAZINE-DM 6.25-15 MG/5ML PO SYRP: 5.0000 mL | ORAL_SOLUTION | Freq: Three times a day (TID) | ORAL | 0 refills | Status: DC | PRN

## 2021-06-11 NOTE — ED Triage Notes (Signed)
Pt c/o cough and sore throat since 12/23. Tested pos for flu 12/24. Hx of bronchitis. Dayquil/ nyquil, throat lozenges and tylenol prn.

## 2021-06-11 NOTE — ED Provider Notes (Signed)
[facility]    CSN: [id number] Arrival date & time: 06/11/21  1402      History   Chief Complaint Chief Complaint  Patient presents with   Cough   Sore Throat    HPI Erica Boyer is a 35 y.o. female.   Patient presents today with a weeklong history of URI symptoms.  She reports cough, severe sore throat, shortness of breath, fatigue, malaise, headache.  Denies any chest pain, abdominal pain, diarrhea.  She has had 1 episode of nausea and vomiting.  She has been using DayQuil and NyQuil without improvement of symptoms.  She had a positive flu test approximately 5 days ago but reports symptoms have worsened prompting evaluation today.  She does have a history of asthma and has been using albuterol without improvement of symptoms; last dose was last night.  She has a history of allergies but has done well with Flonase and antihistamines as needed.  She has had influenza and COVID-19 vaccine.  Denies any known sick contacts.  She was recently treated for an ear infection several weeks ago with Omnicef and prednisone but denies additional antibiotics since that time.  She is having difficulty with daily activities as result of symptoms.   Past Medical History:  Diagnosis Date   Asthma    Fructose intolerance    GERD (gastroesophageal reflux disease)    Lactose intolerance     Patient Active Problem List   Diagnosis Date Noted   Patellofemoral syndrome, left 03/09/2021   Left ankle sprain 03/09/2021   Anxiety with depression 02/02/2021   Migraines 03/29/2006   Asthma 11/27/2003    Past Surgical History:  Procedure Laterality Date   NO PAST SURGERIES      OB History   No obstetric history on file.      Home Medications    Prior to Admission medications   Medication Sig Start Date End Date Taking? Authorizing Provider  predniSONE (STERAPRED UNI-PAK 21 TAB) 10 MG (21) TBPK tablet As directed 06/11/21  Yes Cicero Noy K, PA-C   promethazine-dextromethorphan (PROMETHAZINE-DM) 6.25-15 MG/5ML syrup Take 5 mLs by mouth 3 (three) times daily as needed for cough. 06/11/21  Yes Ayahna Solazzo K, PA-C  albuterol (VENTOLIN HFA) 108 (90 Base) MCG/ACT inhaler Inhale 2 puffs into the lungs every 4 (four) hours as needed for wheezing or shortness of breath. 10/07/20   Christen Butter, NP  escitalopram (LEXAPRO) 10 MG tablet Take 1 tablet (10 mg total) by mouth daily. 02/02/21   Christen Butter, NP  etonogestrel-ethinyl estradiol (NUVARING) 0.12-0.015 MG/24HR vaginal ring INSERT 1 RING VAGINALLY AS DIRECTED. REMOVE AFTER 3 WEEKS & WAIT 7 DAYS BEFORE INSERTING A NEW RING 01/26/21   Christen Butter, NP  fluticasone (FLONASE) 50 MCG/ACT nasal spray Place into both nostrils daily. Patient not taking: Reported on 05/19/2021    [provider]  hydrOXYzine (ATARAX/VISTARIL) 25 MG tablet Take 1/2 to 2 tablets (12.5-50 mg total) by mouth 3 times daily as needed. 02/02/21   Christen Butter, NP  levocetirizine (XYZAL) 5 MG tablet Take 1 tablet (5 mg total) by mouth every evening. 11/14/20   Christen Butter, NP  meloxicam (MOBIC) 15 MG tablet Take 1 tabelt by mouth every morning with a meal for 2 weeks, then daily as needed pain. 03/09/21   Monica Becton, MD  omeprazole (PRILOSEC) 40 MG capsule Take 1 capsule (40 mg total) by mouth daily. 10/07/20   Christen Butter, NP  PFIZER COVID-19 VAC BIVALENT injection  03/19/21  [provider]  Vitamin D, Ergocalciferol, (DRISDOL) 1.25 MG (50000 UNIT) CAPS capsule Take 1 capsule (50,000 Units total) by mouth once a week. 06/12/20       Family History Family History  Problem Relation Age of Onset   Hypertension Mother    Sleep apnea Mother    Hyperlipidemia Father    Heart failure Father    Esophageal cancer Father    Hypertension Other    Heart attack Other    Glaucoma Other    Cataracts Other    Dementia Other    Diabetes Other    Stroke Other    Esophageal cancer Other     Social History Social  History   Tobacco Use   Smoking status: Never   Smokeless tobacco: Never  Vaping Use   Vaping Use: Never used  Substance Use Topics   Alcohol use: Yes    Comment: Rarely   Drug use: Never     Allergies   Cephalexin, Mometasone, Moxifloxacin, Sumatriptan, and Sumatriptan-naproxen sodium   Review of Systems Review of Systems  Constitutional:  Positive for activity change and fatigue. Negative for appetite change and fever.  HENT:  Positive for congestion, postnasal drip and sore throat. Negative for sinus pressure and sneezing.   Respiratory:  Positive for cough, chest tightness and shortness of breath.   Cardiovascular:  Negative for chest pain.  Gastrointestinal:  Positive for nausea and vomiting. Negative for abdominal pain and diarrhea.  Musculoskeletal:  Negative for arthralgias and myalgias.  Neurological:  Positive for headaches. Negative for dizziness and light-headedness.    Physical Exam Triage Vital Signs ED Triage Vitals  Enc Vitals Group     BP 06/11/21 1459 140/87     Pulse Rate 06/11/21 1459 (!) 103     Resp 06/11/21 1459 17     Temp 06/11/21 1459 98.6 F (37 C)     Temp Source 06/11/21 1459 Oral     SpO2 06/11/21 1459 97 %     Weight --      Height --      Head Circumference --      Peak Flow --      Pain Score 06/11/21 1502 7     Pain Loc --      Pain Edu? --      Excl. in GC? --    No data found.  Updated Vital Signs BP 140/87 (BP Location: Right Arm)    Pulse (!) 103    Temp 98.6 F (37 C) (Oral)    Resp 17    SpO2 97%   Visual Acuity Right Eye Distance:   Left Eye Distance:   Bilateral Distance:    Right Eye Near:   Left Eye Near:    Bilateral Near:     Physical Exam Vitals reviewed.  Constitutional:      General: She is awake. She is not in acute distress.    Appearance: Normal appearance. She is well-developed. She is not ill-appearing.     Comments: Very pleasant female appears stated age in no acute distress sitting  comfortably in exam room  HENT:     Head: Normocephalic and atraumatic.     Right Ear: Tympanic membrane, ear canal and external ear normal. Tympanic membrane is not erythematous or bulging.     Left Ear: Tympanic membrane, ear canal and external ear normal. Tympanic membrane is not erythematous or bulging.     Nose:     Right Sinus: Maxillary sinus  tenderness present. No frontal sinus tenderness.     Left Sinus: Maxillary sinus tenderness present. No frontal sinus tenderness.     Mouth/Throat:     Pharynx: Uvula midline. Posterior oropharyngeal erythema present. No oropharyngeal exudate.  Cardiovascular:     Rate and Rhythm: Normal rate and regular rhythm.     Heart sounds: Normal heart sounds, S1 normal and S2 normal. No murmur heard. Pulmonary:     Effort: Pulmonary effort is normal.     Breath sounds: Wheezing present. No rhonchi or rales.     Comments: Scattered wheezing.  Reactive cough with deep breathing. Psychiatric:        Behavior: Behavior is cooperative.     UC Treatments / Results  Labs (all labs ordered are listed, but only abnormal results are displayed) Labs Reviewed  POCT RAPID STREP A (OFFICE)    EKG   Radiology No results found.  Procedures Procedures (including critical care time)  Medications Ordered in UC Medications  albuterol (VENTOLIN HFA) 108 (90 Base) MCG/ACT inhaler 4 puff (4 puffs Inhalation Given 06/11/21 1615)  methylPREDNISolone sodium succinate (SOLU-MEDROL) 125 mg/2 mL injection 60 mg (60 mg Intramuscular Given 06/11/21 1623)    Initial Impression / Assessment and Plan / UC Course  I have reviewed the triage vital signs and the nursing notes.  Pertinent labs & imaging results that were available during my care of the patient were reviewed by me and considered in my medical decision making (see chart for details).     No indication for viral testing given patient has been symptomatic for almost a week and has already had a positive  flu test.  Suspect that influenza has triggered asthma flare causing current constellation of symptoms.  Strep testing was obtained given severity of sore throat and was negative.  Discussed that symptoms are most consistent with bronchitis/asthma flare and patient was given Solu-Medrol and albuterol in clinic.  She did have improvement of coughing and shortness of breath symptoms with this medication regimen.  She was encouraged to use albuterol every 4-6 hours as needed.  We will start prednisone taper with instruction not to take NSAIDs including aspirin, ibuprofen/Advil, naproxen/Aleve as this can cause stomach bleeding.  She was given Promethazine DM for cough with instruction not to drive or drink alcohol while taking this medication as drowsiness is a common side effect.  Recommended she use Flonase, Mucinex, Tylenol for additional symptom relief.  She is to rest and drink plenty of fluid.  She was provided a work excuse note.  Discussed alarm symptoms that warrant emergent evaluation including shortness of breath, chest pain, fever responding to medication, worsening cough, nausea/vomiting interfering with oral intake.  Strict return precautions given to which she expressed understanding.  Final Clinical Impressions(s) / UC Diagnoses   Final diagnoses:  Upper respiratory tract infection, unspecified type  Mild intermittent asthma with acute exacerbation     Discharge Instructions      I believe that your asthma is flared which is causing your symptoms.  Please use albuterol inhaler every 4-6 hours as needed.  We have given you an injection of steroids today and I would like you to start prednisone taper tomorrow.  Do not take NSAIDs including aspirin, ibuprofen/Advil, naproxen/Aleve with this medication as it can cause stomach bleeding.  Use Promethazine DM for cough.  This will make you sleepy so do not drive or drink alcohol with taking it.  Use Tylenol, Mucinex, Flonase for additional symptom  relief.  If  you have any worsening symptoms including high fever not responding to medication, shortness of breath, worsening cough, chest pain you need to go to the emergency room.  If symptoms have not improved by next week please return for reevaluation or see your PCP.     ED Prescriptions     Medication Sig Dispense Auth. Provider   predniSONE (STERAPRED UNI-PAK 21 TAB) 10 MG (21) TBPK tablet As directed 21 tablet Ketih Goodie K, PA-C   promethazine-dextromethorphan (PROMETHAZINE-DM) 6.25-15 MG/5ML syrup Take 5 mLs by mouth 3 (three) times daily as needed for cough. 118 mL Loveta Dellis K, PA-C      PDMP not reviewed this encounter.   Jeani Hawking, PA-C 06/11/21 1648

## 2021-06-11 NOTE — Discharge Instructions (Signed)
I believe that your asthma is flared which is causing your symptoms.  Please use albuterol inhaler every 4-6 hours as needed.  We have given you an injection of steroids today and I would like you to start prednisone taper tomorrow.  Do not take NSAIDs including aspirin, ibuprofen/Advil, naproxen/Aleve with this medication as it can cause stomach bleeding.  Use Promethazine DM for cough.  This will make you sleepy so do not drive or drink alcohol with taking it.  Use Tylenol, Mucinex, Flonase for additional symptom relief.  If you have any worsening symptoms including high fever not responding to medication, shortness of breath, worsening cough, chest pain you need to go to the emergency room.  If symptoms have not improved by next week please return for reevaluation or see your PCP.

## 2021-06-17 DIAGNOSIS — J4521 Mild intermittent asthma with (acute) exacerbation: Secondary | ICD-10-CM | POA: Diagnosis not present

## 2021-06-17 DIAGNOSIS — J0100 Acute maxillary sinusitis, unspecified: Secondary | ICD-10-CM | POA: Diagnosis not present

## 2021-06-18 ENCOUNTER — Encounter: Payer: Self-pay | Admitting: Family Medicine

## 2021-06-18 ENCOUNTER — Telehealth (INDEPENDENT_AMBULATORY_CARE_PROVIDER_SITE_OTHER): Payer: Self-pay | Admitting: Family Medicine

## 2021-06-18 ENCOUNTER — Other Ambulatory Visit: Payer: Self-pay

## 2021-06-18 DIAGNOSIS — Z91199 Patient's noncompliance with other medical treatment and regimen due to unspecified reason: Secondary | ICD-10-CM

## 2021-06-18 NOTE — Progress Notes (Signed)
Link to virtual appointment sent at [phone] and [phone]. Telephone call with no answer at [phone].  No show for appointment.

## 2021-08-05 ENCOUNTER — Ambulatory Visit (INDEPENDENT_AMBULATORY_CARE_PROVIDER_SITE_OTHER): Payer: Self-pay | Admitting: Medical-Surgical

## 2021-08-05 DIAGNOSIS — Z91199 Patient's noncompliance with other medical treatment and regimen due to unspecified reason: Secondary | ICD-10-CM

## 2021-08-05 NOTE — Progress Notes (Signed)
   Complete physical exam  Patient: Erica Boyer   DOB: 04/03/1999   36 y.o. Female  MRN: [id number]  Subjective:    No chief complaint on file.   Erica Boyer is a 36 y.o. female who presents today for a complete physical exam. She reports consuming a {diet types:17450} diet. {types:19826} She generally feels {DESC; WELL/FAIRLY WELL/POORLY:18703}. She reports sleeping {DESC; WELL/FAIRLY WELL/POORLY:18703}. She {does/does not:200015} have additional problems to discuss today.    Most recent fall risk assessment:    12/09/2021   10:42 AM  Fall Risk   Falls in the past year? 0  Number falls in past yr: 0  Injury with Fall? 0  Risk for fall due to : No Fall Risks  Follow up Falls evaluation completed     Most recent depression screenings:    12/09/2021   10:42 AM 10/30/2020   10:46 AM  PHQ 2/9 Scores  PHQ - 2 Score 0 0  PHQ- 9 Score 5     {VISON DENTAL STD PSA (Optional):27386}  {History (Optional):23778}  Patient Care Team: Shaianne Nucci, NP as PCP - General (Nurse Practitioner)   Outpatient Medications Prior to Visit  Medication Sig   fluticasone (FLONASE) 50 MCG/ACT nasal spray Place 2 sprays into both nostrils in the morning and at bedtime. After 7 days, reduce to once daily.   norgestimate-ethinyl estradiol (SPRINTEC 28) 0.25-35 MG-MCG tablet Take 1 tablet by mouth daily.   Nystatin POWD Apply liberally to affected area 2 times per day   spironolactone (ALDACTONE) 100 MG tablet Take 1 tablet (100 mg total) by mouth daily.   No facility-administered medications prior to visit.    ROS        Objective:     There were no vitals taken for this visit. {Vitals History (Optional):23777}  Physical Exam   No results found for any visits on 01/14/22. {Show previous labs (optional):23779}    Assessment & Plan:    Routine Health Maintenance and Physical Exam  Immunization History  Administered Date(s) Administered   DTaP 06/17/1999, 08/13/1999,  10/22/1999, 07/07/2000, 01/21/2004   Hepatitis A 11/17/2007, 11/22/2008   Hepatitis B 04/04/1999, 05/12/1999, 10/22/1999   HiB (PRP-OMP) 06/17/1999, 08/13/1999, 10/22/1999, 07/07/2000   IPV 06/17/1999, 08/13/1999, 04/11/2000, 01/21/2004   Influenza,inj,Quad PF,6+ Mos 02/22/2014   Influenza-Unspecified 05/24/2012   MMR 04/11/2001, 01/21/2004   Meningococcal Polysaccharide 11/22/2011   Pneumococcal Conjugate-13 07/07/2000   Pneumococcal-Unspecified 10/22/1999, 01/05/2000   Tdap 11/22/2011   Varicella 04/11/2000, 11/17/2007    Health Maintenance  Topic Date Due   HIV Screening  Never done   Hepatitis C Screening  Never done   INFLUENZA VACCINE  01/12/2022   PAP-Cervical Cytology Screening  01/14/2022 (Originally 04/02/2020)   PAP SMEAR-Modifier  01/14/2022 (Originally 04/02/2020)   TETANUS/TDAP  01/14/2022 (Originally 11/21/2021)   HPV VACCINES  Discontinued   COVID-19 Vaccine  Discontinued    Discussed health benefits of physical activity, and encouraged her to engage in regular exercise appropriate for her age and condition.  Problem List Items Addressed This Visit   None Visit Diagnoses     Annual physical exam    -  Primary   Cervical cancer screening       Need for Tdap vaccination          No follow-ups on file.     Nathan Stallworth, NP   

## 2021-08-12 ENCOUNTER — Other Ambulatory Visit: Payer: Self-pay

## 2021-08-12 ENCOUNTER — Encounter: Payer: Self-pay | Admitting: Emergency Medicine

## 2021-08-12 ENCOUNTER — Emergency Department
Admission: RE | Admit: 2021-08-12 | Discharge: 2021-08-12 | Discharge status: Absent without leave | Payer: Self-pay | Source: Ambulatory Visit

## 2021-08-12 ENCOUNTER — Emergency Department
Admission: EM | Admit: 2021-08-12 | Discharge: 2021-08-12 | Disposition: A | Discharge status: Home / Self Care | Payer: 59 | Source: Home / Self Care

## 2021-08-12 DIAGNOSIS — L600 Ingrowing nail: Secondary | ICD-10-CM | POA: Diagnosis not present

## 2021-08-12 MED ORDER — SULFAMETHOXAZOLE-TRIMETHOPRIM 800-160 MG PO TABS: 1.0000 | ORAL_TABLET | Freq: Two times a day (BID) | ORAL | 0 refills | Status: AC

## 2021-08-12 NOTE — ED Provider Notes (Signed)
?[facility] ? ? ? ?CSN: [id number] ?Arrival date & time: 08/12/21  1609 ? ? ?  ? ?History   ?Chief Complaint ?Chief Complaint  ?Patient presents with  ? Toe Pain  ?  Right great toe  ? ? ?HPI ?Erica Boyer is a 36 y.o. female.  ? ?HPI 36 year old female presents with a right great toe pain for 2 days.  Reports history of ingrown toenails.  Patient reports clipping inner aspect of toenail plate to see if it would drain, patient was and husband (present) report bloody drainage. ? ?Past Medical History:  ?Diagnosis Date  ? Asthma   ? Fructose intolerance   ? GERD (gastroesophageal reflux disease)   ? Lactose intolerance   ? ? ?Patient Active Problem List  ? Diagnosis Date Noted  ? Patellofemoral syndrome, left 03/09/2021  ? Left ankle sprain 03/09/2021  ? Anxiety with depression 02/02/2021  ? Migraines 03/29/2006  ? Asthma 11/27/2003  ? ? ?Past Surgical History:  ?Procedure Laterality Date  ? NO PAST SURGERIES    ? ? ?OB History   ?No obstetric history on file. ?  ? ? ? ?Home Medications   ? ?Prior to Admission medications   ?Medication Sig Start Date End Date Taking? Authorizing Provider  ?cholecalciferol (VITAMIN D3) 25 MCG (1000 UNIT) tablet Take 1,000 Units by mouth daily.   Yes [provider]  ?sulfamethoxazole-trimethoprim (BACTRIM DS) 800-160 MG tablet Take 1 tablet by mouth 2 (two) times daily for 10 days. 08/12/21 08/22/21 Yes Trevor Iha, FNP  ?albuterol (VENTOLIN HFA) 108 (90 Base) MCG/ACT inhaler Inhale 2 puffs into the lungs every 4 (four) hours as needed for wheezing or shortness of breath. 10/07/20   Christen Butter, NP  ?escitalopram (LEXAPRO) 10 MG tablet Take 1 tablet (10 mg total) by mouth daily. 02/02/21   Christen Butter, NP  ?etonogestrel-ethinyl estradiol (NUVARING) 0.12-0.015 MG/24HR vaginal ring INSERT 1 RING VAGINALLY AS DIRECTED. REMOVE AFTER 3 WEEKS & WAIT 7 DAYS BEFORE INSERTING A NEW RING ?Patient not taking: Reported on 08/12/2021 01/26/21   Christen Butter, NP  ?fluticasone  (FLONASE) 50 MCG/ACT nasal spray Place into both nostrils daily. ?Patient not taking: Reported on 05/19/2021    [provider]  ?hydrOXYzine (ATARAX/VISTARIL) 25 MG tablet Take 1/2 to 2 tablets (12.5-50 mg total) by mouth 3 times daily as needed. 02/02/21   Christen Butter, NP  ?levocetirizine (XYZAL) 5 MG tablet Take 1 tablet (5 mg total) by mouth every evening. 11/14/20   Christen Butter, NP  ?meloxicam (MOBIC) 15 MG tablet Take 1 tabelt by mouth every morning with a meal for 2 weeks, then daily as needed pain. ?Patient not taking: Reported on 08/12/2021 03/09/21   Monica Becton, MD  ?omeprazole (PRILOSEC) 40 MG capsule Take 1 capsule (40 mg total) by mouth daily. 10/07/20   Christen Butter, NP  ?PFIZER COVID-19 VAC BIVALENT injection  03/19/21   [provider]  ?predniSONE (STERAPRED UNI-PAK 21 TAB) 10 MG (21) TBPK tablet As directed ?Patient not taking: Reported on 08/12/2021 06/11/21   Raspet, Noberto Retort, PA-C  ?promethazine-dextromethorphan (PROMETHAZINE-DM) 6.25-15 MG/5ML syrup Take 5 mLs by mouth 3 (three) times daily as needed for cough. ?Patient not taking: Reported on 08/12/2021 06/11/21   Raspet, Noberto Retort, PA-C  ?QVAR REDIHALER 40 MCG/ACT inhaler  06/19/21   [provider]  ?Vitamin D, Ergocalciferol, (DRISDOL) 1.25 MG (50000 UNIT) CAPS capsule Take 1 capsule (50,000 Units total) by mouth once a week. ?Patient not taking: Reported on 08/12/2021  06/12/20     ? ? ?Family History ?Family History  ?Problem Relation Age of Onset  ? Hypertension Mother   ? Sleep apnea Mother   ? Hyperlipidemia Father   ? Heart failure Father   ? Esophageal cancer Father   ? Hypertension Other   ? Heart attack Other   ? Glaucoma Other   ? Cataracts Other   ? Dementia Other   ? Diabetes Other   ? Stroke Other   ? Esophageal cancer Other   ? ? ?Social History ?Social History  ? ?Tobacco Use  ? Smoking status: Never  ?  Passive exposure: Never  ? Smokeless tobacco: Never  ?Vaping Use  ? Vaping Use: Never used  ?Substance Use  Topics  ? Alcohol use: Yes  ?  Comment: Rarely  ? Drug use: Never  ? ? ? ?Allergies   ?Cephalexin, Mometasone, Moxifloxacin, Sumatriptan, and Sumatriptan-naproxen sodium ? ? ?Review of Systems ?Review of Systems  ?Skin:   ?     Possibly infected right great toenail x 2 days  ? ? ?Physical Exam ?Triage Vital Signs ?ED Triage Vitals  ?Enc Vitals Group  ?   BP 08/12/21 1620 129/83  ?   Pulse Rate 08/12/21 1620 85  ?   Resp 08/12/21 1620 17  ?   Temp 08/12/21 1620 98.7 ?F (37.1 ?C)  ?   Temp Source 08/12/21 1620 Oral  ?   SpO2 08/12/21 1620 99 %  ?   Weight 08/12/21 1626 219 lb (99.3 kg)  ?   Height 08/12/21 1626 5\' 1"  (1.549 m)  ?   Head Circumference --   ?   Peak Flow --   ?   Pain Score 08/12/21 1625 4  ?   Pain Loc --   ?   Pain Edu? --   ?   Excl. in GC? --   ? ?No data found. ? ?Updated Vital Signs ?BP 129/83 (BP Location: Left Arm)   Pulse 85   Temp 98.7 ?F (37.1 ?C) (Oral)   Resp 17   Ht 5\' 1"  (1.549 m)   Wt 219 lb (99.3 kg)   LMP 08/10/2021 (Exact Date)   SpO2 99%   BMI 41.38 kg/m?  ? ?  ? ?Physical Exam ?Vitals and nursing note reviewed.  ?Constitutional:   ?   General: She is not in acute distress. ?   Appearance: Normal appearance. She is obese. She is not ill-appearing.  ?HENT:  ?   Head: Normocephalic and atraumatic.  ?   Mouth/Throat:  ?   Mouth: Mucous membranes are moist.  ?   Pharynx: Oropharynx is clear.  ?Eyes:  ?   Extraocular Movements: Extraocular movements intact.  ?   Conjunctiva/sclera: Conjunctivae normal.  ?   Pupils: Pupils are equal, round, and reactive to light.  ?Cardiovascular:  ?   Rate and Rhythm: Normal rate and regular rhythm.  ?   Pulses: Normal pulses.  ?   Heart sounds: Normal heart sounds.  ?Pulmonary:  ?   Effort: Pulmonary effort is normal.  ?   Breath sounds: Normal breath sounds.  ?Musculoskeletal:  ?   Cervical back: Normal range of motion and neck supple.  ?Skin: ?   Comments: Right great toe(medial aspect/base of nail plate): TTP, erythematous, indurated nail  border, with scant serous drainage noted  ?Neurological:  ?   General: No focal deficit present.  ?   Mental Status: She is alert and oriented to person, place, and  time. Mental status is at baseline.  ? ? ? ?UC Treatments / Results  ?Labs ?(all labs ordered are listed, but only abnormal results are displayed) ?Labs Reviewed - No data to display ? ?EKG ? ? ?Radiology ?No results found. ? ?Procedures ?Procedures (including critical care time) ? ?Medications Ordered in UC ?Medications - No data to display ? ?Initial Impression / Assessment and Plan / UC Course  ?I have reviewed the triage vital signs and the nursing notes. ? ?Pertinent labs & imaging results that were available during my care of the patient were reviewed by me and considered in my medical decision making (see chart for details). ? ?  ? ?MDM: 1.  Ingrown right greater toenail-Rx'd Bactrim. Advised/instructed patient to take medication as directed with food to completion.  Encouraged patient increase daily water intake while taking this medication.  Advised/encouraged patient if symptoms worsen please follow-up with [facility] podiatrist (contact information has been provided above/with this AVS) for further evaluation.  Patient discharged home, hemodynamically stable. ?Final Clinical Impressions(s) / UC Diagnoses  ? ?Final diagnoses:  ?Ingrown right greater toenail  ? ? ? ?Discharge Instructions   ? ?  ?Advised/instructed patient to take medication as directed with food to completion.  Encouraged patient increase daily water intake while taking this medication.  Advised/encouraged patient if symptoms worsen please follow-up with [facility] podiatrist (contact information has been provided above) for further evaluation. ? ? ? ? ?ED Prescriptions   ? ? Medication Sig Dispense Auth. Provider  ? sulfamethoxazole-trimethoprim (BACTRIM DS) 800-160 MG tablet Take 1 tablet by mouth 2 (two) times daily for 10 days. 20 tablet Trevor Iha, FNP  ? ?  ? ?PDMP  not reviewed this encounter. ?  ?Trevor Iha, FNP ?08/12/21 1651 ? ?

## 2021-08-12 NOTE — Discharge Instructions (Addendum)
Advised/instructed patient to take medication as directed with food to completion.  Encouraged patient increase daily water intake while taking this medication.  Advised/encouraged patient if symptoms worsen please follow-up with [facility] podiatrist (contact information has been provided above) for further evaluation. ?

## 2021-08-12 NOTE — ED Triage Notes (Signed)
Pain to R great toe x  2 days  ?History of ingrown toe nails  ?Clipped inner aspect of nail to see if it would drain - bloody drainage per pt's husband ?Here w/ husband  ?OTC meds  - midol - 2 tabs at 0800 & 400mg  of Ibuprofen  ?

## 2021-08-18 ENCOUNTER — Ambulatory Visit: Payer: 59 | Admitting: Physician Assistant

## 2021-08-27 ENCOUNTER — Encounter: Payer: Self-pay | Admitting: Podiatry

## 2021-08-27 ENCOUNTER — Other Ambulatory Visit: Payer: Self-pay

## 2021-08-27 ENCOUNTER — Ambulatory Visit: Payer: 59 | Admitting: Podiatry

## 2021-08-27 DIAGNOSIS — L600 Ingrowing nail: Secondary | ICD-10-CM

## 2021-08-27 NOTE — Progress Notes (Signed)
?  Subjective:  ?Patient ID: Erica Boyer, female    DOB: 04-Jan-1986,   MRN: [id number] ? ?Chief Complaint  ?Patient presents with  ? Nail Problem  ?  -possible infected ingrown R hallux  ? ? ?36 y.o. female presents for concern of right infected ingrown toenail. Relates she has a history of ingrown nails that she has always had trimmed out. She does have a family history of ingrown's. Relates she thinks she needs a procedure done . Denies any other pedal complaints. Denies n/v/f/c.  ? ?Past Medical History:  ?Diagnosis Date  ? Asthma   ? Fructose intolerance   ? GERD (gastroesophageal reflux disease)   ? Lactose intolerance   ? ? ?Objective:  ?Physical Exam: ?Vascular: DP/PT pulses 2/4 bilateral. CFT <3 seconds. Normal hair growth on digits. No edema.  ?Skin. No lacerations or abrasions bilateral feet. Incurvation of lateral border of right hallux with edema and mild erythema present. No purulence noted. Tender to the lateral border.  ?Musculoskeletal: MMT 5/5 bilateral lower extremities in DF, PF, Inversion and Eversion. Deceased ROM in DF of ankle joint.  ?Neurological: Sensation intact to light touch.  ? ?Assessment:  ? ?1. Ingrown nail of great toe of right foot   ? ? ? ?Plan:  ?Patient was evaluated and treated and all questions answered. ?Patient requesting removal of ingrown nail today. Procedure below.  ?Discussed procedure and post procedure care and patient expressed understanding.  ?Will follow-up in 2 weeks for nail check or sooner if any problems arise.  ? ? ?Procedure:  ?Procedure: partial Nail Avulsion of right hallux lateral nail border.  ?Surgeon: Louann Sjogren, DPM  ?Pre-op Dx: Ingrown toenail with infection ?Post-op: Same  ?Place of Surgery: Office exam room.  ?Indications for surgery: Painful and ingrown toenail.  ? ? ?The patient is requesting removal of nail with chemical matrixectomy. Risks and complications were discussed with the patient for which they understand and written consent was  obtained. Under sterile conditions a total of 3 mL of  1% lidocaine plain was infiltrated in a hallux block fashion. Once anesthetized, the skin was prepped in sterile fashion. A tourniquet was then applied. Next the lateral aspect of hallux nail border was then sharply excised making sure to remove the entire offending nail border.  Next phenol was then applied under standard conditions and copiously irrigated. Silvadene was applied. A dry sterile dressing was applied. After application of the dressing the tourniquet was removed and there is found to be an immediate capillary refill time to the digit. The patient tolerated the procedure well without any complications. Post procedure instructions were discussed the patient for which he verbally understood. Follow-up in two weeks for nail check or sooner if any problems are to arise. Discussed signs/symptoms of infection and directed to call the office immediately should any occur or go directly to the emergency room. In the meantime, encouraged to call the office with any questions, concerns, changes symptoms. ? ? ?Louann Sjogren, DPM  ? ? ?

## 2021-08-27 NOTE — Patient Instructions (Addendum)

## 2021-09-10 ENCOUNTER — Ambulatory Visit: Payer: 59 | Admitting: Podiatry

## 2021-09-10 ENCOUNTER — Encounter: Payer: Self-pay | Admitting: Podiatry

## 2021-09-10 DIAGNOSIS — L600 Ingrowing nail: Secondary | ICD-10-CM

## 2021-09-10 NOTE — Progress Notes (Signed)
?  Subjective:  ?Patient ID: Erica Boyer, female    DOB: Dec 24, 1985,   MRN: [id number] ? ?Chief Complaint  ?Patient presents with  ? Nail Problem  ?  The right big toenail is doing ok and there is still some draining and I am soaking the toe as well  ? ? ?36 y.o. female presents for follow-up of right great toenail procedure. Relates she has been soaking and covering the toe and relates some mild soreness but overall doing better. Denies any other pedal complaints. Denies n/v/f/c.  ? ?Past Medical History:  ?Diagnosis Date  ? Asthma   ? Fructose intolerance   ? GERD (gastroesophageal reflux disease)   ? Lactose intolerance   ? ? ?Objective:  ?Physical Exam: ?Vascular: DP/PT pulses 2/4 bilateral. CFT <3 seconds. Normal hair growth on digits. No edema.  ?Skin. No lacerations or abrasions bilateral feet. Right hallux nail healing well with some scabbing noted around area.  ?Musculoskeletal: MMT 5/5 bilateral lower extremities in DF, PF, Inversion and Eversion. Deceased ROM in DF of ankle joint.  ?Neurological: Sensation intact to light touch.  ? ?Assessment:  ? ?1. Ingrown nail of great toe of right foot   ? ? ? ?Plan:  ?Patient was evaluated and treated and all questions answered. ?Toe was evaluated and appears to be healing well.  ?May discontinue soaks and neosporin after the next few days.   ?Patient to follow-up as needed.  ? ? ?Louann Sjogren, DPM  ? ? ?

## 2021-09-16 ENCOUNTER — Other Ambulatory Visit: Payer: Self-pay | Admitting: Medical-Surgical

## 2021-09-16 DIAGNOSIS — F418 Other specified anxiety disorders: Secondary | ICD-10-CM

## 2021-09-17 ENCOUNTER — Other Ambulatory Visit (HOSPITAL_COMMUNITY): Payer: Self-pay

## 2021-09-17 MED ORDER — ESCITALOPRAM OXALATE 10 MG PO TABS
10.0000 mg | ORAL_TABLET | Freq: Every day | ORAL | 0 refills | Status: DC
  Filled 2021-09-17 – 2021-09-28 (×2): qty 30, 30d supply, fill #0

## 2021-09-17 MED ORDER — HYDROXYZINE HCL 25 MG PO TABS
12.5000 mg | ORAL_TABLET | Freq: Three times a day (TID) | ORAL | 0 refills | Status: DC | PRN
  Filled 2021-09-17: qty 60, 10d supply, fill #0

## 2021-09-17 NOTE — Telephone Encounter (Signed)
Pls contact patient to schedule appt for labs and medication refills. Sending 30 day refills.  ?

## 2021-09-21 NOTE — Telephone Encounter (Signed)
V. Mail left for patient to call and schedule appt. for labs & med refills. ?

## 2021-09-25 ENCOUNTER — Other Ambulatory Visit (HOSPITAL_COMMUNITY): Payer: Self-pay

## 2021-09-28 ENCOUNTER — Other Ambulatory Visit (HOSPITAL_COMMUNITY): Payer: Self-pay

## 2021-10-29 ENCOUNTER — Other Ambulatory Visit (HOSPITAL_COMMUNITY): Payer: Self-pay

## 2021-10-29 ENCOUNTER — Other Ambulatory Visit: Payer: Self-pay | Admitting: Medical-Surgical

## 2021-10-29 DIAGNOSIS — F418 Other specified anxiety disorders: Secondary | ICD-10-CM

## 2021-10-29 MED ORDER — ESCITALOPRAM OXALATE 10 MG PO TABS
10.0000 mg | ORAL_TABLET | Freq: Every day | ORAL | 0 refills | Status: DC
  Filled 2021-10-29: qty 15, 15d supply, fill #0

## 2021-11-04 ENCOUNTER — Telehealth: Payer: 59 | Admitting: Family Medicine

## 2021-11-04 DIAGNOSIS — J019 Acute sinusitis, unspecified: Secondary | ICD-10-CM

## 2021-11-04 DIAGNOSIS — B9689 Other specified bacterial agents as the cause of diseases classified elsewhere: Secondary | ICD-10-CM | POA: Diagnosis not present

## 2021-11-05 MED ORDER — DOXYCYCLINE HYCLATE 100 MG PO TABS: 100.0000 mg | ORAL_TABLET | Freq: Two times a day (BID) | ORAL | 0 refills | Status: AC

## 2021-11-05 NOTE — Progress Notes (Signed)

## 2021-11-06 ENCOUNTER — Telehealth: Payer: 59 | Admitting: Emergency Medicine

## 2021-11-06 DIAGNOSIS — L03032 Cellulitis of left toe: Secondary | ICD-10-CM

## 2021-11-06 NOTE — Progress Notes (Signed)
Based on what you shared with me, I feel your condition warrants further evaluation and I recommend that you be seen in a face to face visit. I am concerned you have an abscess, not just cellulitis, and this would need to be examined to see if needs to be drained. You can try to see Dr. Ralene Cork or another podiatrist at her office today. Or you can be seen at an Urgent Care - below is a list of the [facility] Urgent Care locations.    NOTE: There will be NO CHARGE for this eVisit   If you are having a true medical emergency please call 911.      For an urgent face to face visit, [place] has six urgent care centers for your convenience:     [facility] Urgent Care Center at [facility] Directions [phone] [address] [place], [place] [address]    [facility] Health Urgent Care Center [facility]) Get Driving Directions [phone] [address] [place], [place] [address]  [facility] Health Urgent Care Center [facility]) Get Driving Directions [phone] [address] [place],  [place]  [address]  [facility] Health Urgent Care at [facility] Get Driving Directions [phone] [address] [place], [place] [address]   [facility] Health Urgent Care at [facility] Get Driving Directions  [phone] [address] [place], [place] [address]   [facility] Health Urgent Care at [facility] Directions [phone] [address] [place], [place] [address]  Your MyChart E-visit questionnaire answers were reviewed by a board certified advanced clinical practitioner to complete your personal care plan based on your specific symptoms.  Thank you for using e-Visits.

## 2021-11-13 ENCOUNTER — Telehealth (INDEPENDENT_AMBULATORY_CARE_PROVIDER_SITE_OTHER): Payer: 59 | Admitting: Medical-Surgical

## 2021-11-13 ENCOUNTER — Encounter: Payer: Self-pay | Admitting: Medical-Surgical

## 2021-11-13 DIAGNOSIS — Z8639 Personal history of other endocrine, nutritional and metabolic disease: Secondary | ICD-10-CM

## 2021-11-13 DIAGNOSIS — J302 Other seasonal allergic rhinitis: Secondary | ICD-10-CM | POA: Diagnosis not present

## 2021-11-13 DIAGNOSIS — K219 Gastro-esophageal reflux disease without esophagitis: Secondary | ICD-10-CM

## 2021-11-13 DIAGNOSIS — F418 Other specified anxiety disorders: Secondary | ICD-10-CM | POA: Diagnosis not present

## 2021-11-13 HISTORY — DX: Personal history of other endocrine, nutritional and metabolic disease: Z86.39

## 2021-11-13 HISTORY — DX: Gastro-esophageal reflux disease without esophagitis: K21.9

## 2021-11-13 MED ORDER — ESCITALOPRAM OXALATE 10 MG PO TABS
10.0000 mg | ORAL_TABLET | Freq: Every day | ORAL | 3 refills | Status: DC
  Filled 2021-11-23: qty 90, 90d supply, fill #0
  Filled 2022-02-25: qty 90, 90d supply, fill #1

## 2021-11-13 MED ORDER — VITAMIN D 25 MCG (1000 UNIT) PO TABS: 1000.0000 [IU] | ORAL_TABLET | Freq: Every day | ORAL | 1 refills | Status: DC

## 2021-11-13 MED ORDER — ZYRTEC ALLERGY 10 MG PO CAPS: 10.0000 mg | ORAL_CAPSULE | Freq: Every day | ORAL | 1 refills | Status: DC

## 2021-11-13 MED ORDER — OMEPRAZOLE 40 MG PO CPDR: 40.0000 mg | DELAYED_RELEASE_CAPSULE | Freq: Every day | ORAL | 3 refills | Status: DC

## 2021-11-13 NOTE — Assessment & Plan Note (Signed)
Symptoms well controlled.  Continue Lexapro 10 mg daily.

## 2021-11-13 NOTE — Progress Notes (Signed)
Virtual Visit via Video Note  I connected with Erica Boyer on 11/13/21 at  1:00 PM EDT by a video enabled telemedicine application and verified that I am speaking with the correct person using two identifiers.   I discussed the limitations of evaluation and management by telemedicine and the availability of in person appointments. The patient expressed understanding and agreed to proceed.  Patient location: home Provider locations: office  Subjective:    CC: mood follow-up  HPI: Pleasant 36 year old female presenting today via MyChart video visit for the following:  Mood follow-up-taking Lexapro 10 mg daily, tolerating well without side effects.  Has been on this since November and feels the medication is working well for her.  Needs refills.  Denies SI/HI.  GERD-taking omeprazole 40 mg daily, tolerating well without side effects.  Has been on this medication for a while and feels that it works well for her to control her reflux.  Would like refills of this sent to the pharmacy.  Seasonal allergies-she is currently taking Zyrtec 10 mg daily, tolerating well without side effects.  Feels the medication works well to control her allergies.  She does have albuterol to use as needed but does not use this regularly.  History of vitamin D deficiency-currently taking vitamin D 1000 units daily.  Would like this sent to the pharmacy as a prescription.   Past medical history, Surgical history, Family history not pertinant except as noted below, Social history, Allergies, and medications have been entered into the medical record, reviewed, and corrections made.   Review of Systems: See HPI for pertinent positives and negatives.   Objective:    General: Speaking clearly in complete sentences without any shortness of breath.  Alert and oriented x3.  Normal judgment. No apparent acute distress.  Impression and Recommendations:    Gastroesophageal reflux disease Continue omeprazole 40 mg  daily.  Anxiety with depression Symptoms well controlled.  Continue Lexapro 10 mg daily.  History of vitamin D deficiency Continue vitamin D 1000 units daily.  Seasonal allergies Continue Zyrtec 10 mg daily.  I discussed the assessment and treatment plan with the patient. The patient was provided an opportunity to ask questions and all were answered. The patient agreed with the plan and demonstrated an understanding of the instructions.   The patient was advised to call back or seek an in-person evaluation if the symptoms worsen or if the condition fails to improve as anticipated.  20 minutes of non-face-to-face time was provided during this encounter.  Return for annual physical exam at your convenience.  Thayer Ohm, DNP, APRN, FNP-BC [facility]

## 2021-11-13 NOTE — Assessment & Plan Note (Signed)
Continue Zyrtec 10 mg daily

## 2021-11-13 NOTE — Progress Notes (Signed)
LVM for patient to call back to get physical scheduled before July 1st PER Joy. AMUCK

## 2021-11-13 NOTE — Assessment & Plan Note (Signed)
Continue vitamin D 1000 units daily. 

## 2021-11-13 NOTE — Assessment & Plan Note (Signed)
Continue omeprazole 40 mg daily

## 2021-11-17 ENCOUNTER — Ambulatory Visit: Payer: 59 | Admitting: Nurse Practitioner

## 2021-11-17 ENCOUNTER — Encounter: Payer: Self-pay | Admitting: Nurse Practitioner

## 2021-11-17 VITALS — Ht 61.0 in | Wt 227.0 lb

## 2021-11-17 DIAGNOSIS — L680 Hirsutism: Secondary | ICD-10-CM

## 2021-11-17 DIAGNOSIS — N926 Irregular menstruation, unspecified: Secondary | ICD-10-CM

## 2021-11-17 DIAGNOSIS — Z789 Other specified health status: Secondary | ICD-10-CM

## 2021-11-17 DIAGNOSIS — R5383 Other fatigue: Secondary | ICD-10-CM | POA: Diagnosis not present

## 2021-11-17 DIAGNOSIS — Z6841 Body Mass Index (BMI) 40.0 and over, adult: Secondary | ICD-10-CM

## 2021-11-17 NOTE — Progress Notes (Signed)
   Acute Office Visit  Subjective:    Patient ID: Erica Boyer, female    DOB: 07-13-1985, 36 y.o.   MRN: [id number]   HPI 36 y.o. G1P1001 presents today as new patient for irregular menses. Monthly cycles prior to having daughter 4 years ago. Since then she has had irregularity with bleeding occurring 2-3 times per month, flow ranging from light to heavy. She was on COCs temporarily, then IUD. Spotted a full year with IUD and removed. C/O facial and nipple hair, acne. Attempting to conceive. Normal ultrasounds previously. 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 Mother with thyroid disease.   Review of Systems  Constitutional: Negative.   Genitourinary:  Positive for menstrual problem.  Skin:        Acne, facial and nipple hair      Objective:    Physical Exam Constitutional:      Appearance: Normal appearance. She is obese.  Genitourinary:    General: Normal vulva.     Vagina: Normal.     Cervix: Normal.     Uterus: Normal.     Ht 5\' 1"  (1.549 m)   Wt 227 lb (103 kg)   LMP 11/08/2021   BMI 42.89 kg/m  Wt Readings from Last 3 Encounters:  11/17/21 227 lb (103 kg)  08/12/21 219 lb (99.3 kg)  05/19/21 223 lb 1.3 oz (101.2 kg)        Patient informed chaperone available to be present for breast and/or pelvic exam. Patient has requested no chaperone to be present. Patient has been advised what will be completed during breast and pelvic exam.   Assessment & Plan:   Problem List Items Addressed This Visit   None Visit Diagnoses     Irregular menses    -  Primary   Relevant Orders   17-Hydroxyprogesterone   CBC with Differential/Platelet   Luteinizing hormone   Estradiol   Follicle stimulating hormone   Progesterone   TSH   Hirsutism       Relevant Orders   Testos,Total,Free and SHBG (Female)   Attempting to conceive       Relevant Orders   Luteinizing hormone   Estradiol   Follicle stimulating hormone   Progesterone    Fatigue, unspecified type       Relevant Orders   CBC with Differential/Platelet   Comprehensive metabolic panel   TSH   BMI 40.0-44.9, adult (HCC)       Relevant Orders   Hemoglobin A1c      Plan: PCOS workup. Discussed PCOS and management options. She is hoping to try to conceive soon. Will reach out about plan once results are in. She is agreeable to plan.      03-27-1982 DNP, 3:52 PM 11/17/2021

## 2021-11-19 ENCOUNTER — Ambulatory Visit: Payer: 59 | Admitting: Medical-Surgical

## 2021-11-23 ENCOUNTER — Encounter: Payer: Self-pay | Admitting: Nurse Practitioner

## 2021-11-23 ENCOUNTER — Other Ambulatory Visit (HOSPITAL_COMMUNITY): Payer: Self-pay

## 2021-11-23 LAB — COMPREHENSIVE METABOLIC PANEL
AG Ratio: 1.5 (calc) (ref 1.0–2.5)
ALT: 20 U/L (ref 6–29)
AST: 20 U/L (ref 10–30)
Albumin: 4.3 g/dL (ref 3.6–5.1)
Alkaline phosphatase (APISO): 91 U/L (ref 31–125)
BUN: 8 mg/dL (ref 7–25)
CO2: 25 mmol/L (ref 20–32)
Calcium: 9.6 mg/dL (ref 8.6–10.2)
Chloride: 104 mmol/L (ref 98–110)
Creat: 0.65 mg/dL (ref 0.50–0.97)
Globulin: 2.8 g/dL (calc) (ref 1.9–3.7)
Glucose, Bld: 87 mg/dL (ref 65–99)
Potassium: 4.2 mmol/L (ref 3.5–5.3)
Sodium: 137 mmol/L (ref 135–146)
Total Bilirubin: 0.2 mg/dL (ref 0.2–1.2)
Total Protein: 7.1 g/dL (ref 6.1–8.1)

## 2021-11-23 LAB — CBC WITH DIFFERENTIAL/PLATELET
Absolute Monocytes: 640 cells/uL (ref 200–950)
Basophils Absolute: 47 cells/uL (ref 0–200)
Basophils Relative: 0.6 %
Eosinophils Absolute: 182 cells/uL (ref 15–500)
Eosinophils Relative: 2.3 %
HCT: 37.1 % (ref 35.0–45.0)
Hemoglobin: 12 g/dL (ref 11.7–15.5)
Lymphs Abs: 2939 cells/uL (ref 850–3900)
MCH: 28.4 pg (ref 27.0–33.0)
MCHC: 32.3 g/dL (ref 32.0–36.0)
MCV: 87.7 fL (ref 80.0–100.0)
MPV: 9.1 fL (ref 7.5–12.5)
Monocytes Relative: 8.1 %
Neutro Abs: 4092 cells/uL (ref 1500–7800)
Neutrophils Relative %: 51.8 %
Platelets: 327 10*3/uL (ref 140–400)
RBC: 4.23 10*6/uL (ref 3.80–5.10)
RDW: 11.9 % (ref 11.0–15.0)
Total Lymphocyte: 37.2 %
WBC: 7.9 10*3/uL (ref 3.8–10.8)

## 2021-11-23 LAB — ESTRADIOL: Estradiol: 41 pg/mL

## 2021-11-23 LAB — TESTOS,TOTAL,FREE AND SHBG (FEMALE)
Free Testosterone: 1.6 pg/mL (ref 0.1–6.4)
Sex Hormone Binding: 28 nmol/L (ref 17–124)
Testosterone, Total, LC-MS-MS: 12 ng/dL (ref 2–45)

## 2021-11-23 LAB — TSH: TSH: 1.31 mIU/L

## 2021-11-23 LAB — 17-HYDROXYPROGESTERONE: 17-OH-Progesterone, LC/MS/MS: 14 ng/dL

## 2021-11-23 LAB — FOLLICLE STIMULATING HORMONE: FSH: 9.1 m[IU]/mL

## 2021-11-23 LAB — PROGESTERONE: Progesterone: <0.5 ng/mL

## 2021-11-23 LAB — HEMOGLOBIN A1C
Hgb A1c MFr Bld: 5.5 % of total Hgb (ref <5.7)
Mean Plasma Glucose: 111 mg/dL
eAG (mmol/L): 6.2 mmol/L

## 2021-11-23 LAB — LUTEINIZING HORMONE: LH: 3.5 m[IU]/mL

## 2021-11-24 ENCOUNTER — Other Ambulatory Visit: Payer: Self-pay | Admitting: Nurse Practitioner

## 2021-11-24 DIAGNOSIS — N939 Abnormal uterine and vaginal bleeding, unspecified: Secondary | ICD-10-CM

## 2021-11-24 MED ORDER — NORETHIN ACE-ETH ESTRAD-FE 1-20 MG-MCG PO TABS: 1.0000 | ORAL_TABLET | Freq: Every day | ORAL | 0 refills | Status: DC

## 2021-12-03 ENCOUNTER — Ambulatory Visit: Payer: 59 | Admitting: Podiatry

## 2021-12-03 ENCOUNTER — Encounter: Payer: Self-pay | Admitting: Podiatry

## 2021-12-03 DIAGNOSIS — L600 Ingrowing nail: Secondary | ICD-10-CM

## 2021-12-03 NOTE — Patient Instructions (Signed)

## 2021-12-09 ENCOUNTER — Encounter: Payer: Self-pay | Admitting: Medical-Surgical

## 2021-12-09 ENCOUNTER — Encounter: Payer: Self-pay | Admitting: Nurse Practitioner

## 2021-12-09 NOTE — Telephone Encounter (Signed)
Appointments please schedule visit with TW.

## 2021-12-11 ENCOUNTER — Encounter: Payer: Self-pay | Admitting: Nurse Practitioner

## 2021-12-11 ENCOUNTER — Ambulatory Visit: Payer: 59 | Admitting: Nurse Practitioner

## 2021-12-11 VITALS — BP 118/78

## 2021-12-11 DIAGNOSIS — Z3200 Encounter for pregnancy test, result unknown: Secondary | ICD-10-CM

## 2021-12-11 DIAGNOSIS — Z3201 Encounter for pregnancy test, result positive: Secondary | ICD-10-CM | POA: Diagnosis not present

## 2021-12-11 DIAGNOSIS — O4690 Antepartum hemorrhage, unspecified, unspecified trimester: Secondary | ICD-10-CM

## 2021-12-11 DIAGNOSIS — N912 Amenorrhea, unspecified: Secondary | ICD-10-CM | POA: Diagnosis not present

## 2021-12-11 LAB — PREGNANCY, URINE: Preg Test, Ur: POSITIVE — AB

## 2021-12-11 NOTE — Progress Notes (Signed)
   Acute Office Visit  Subjective:    Patient ID: Erica Boyer, female    DOB: 1985/11/16, 36 y.o.   MRN: [id number]   HPI 36 y.o. presents today for pregnancy confirmation. LMP 10/18/2021. H/O irregular menses occurring 2-3 times per month since having daughter 4 years ago. She was started on birth control to help with cycle regulation. Progesterone <0.5 at that time, so pregnancy occurred after starting COCs. Negative PCOS workup. Scheduled for pelvic ultrasound 01/07/2022. Positive UPT 12/08/2021. Spotting started this morning and has now increased in flow. Mild abdominal cramping.    Review of Systems  Constitutional: Negative.   Gastrointestinal:  Positive for abdominal pain (Cramping).  Genitourinary:  Positive for vaginal bleeding.       Objective:    Physical Exam Constitutional:      Appearance: Normal appearance.  Genitourinary:    General: Normal vulva.     Vagina: Bleeding present.     Cervix: Normal.     Uterus: Normal.      BP 118/78   LMP 10/18/2021  Wt Readings from Last 3 Encounters:  11/17/21 227 lb (103 kg)  08/12/21 219 lb (99.3 kg)  05/19/21 223 lb 1.3 oz (101.2 kg)        Patient informed chaperone available to be present for breast and/or pelvic exam. Patient has requested no chaperone to be present. Patient has been advised what will be completed during breast and pelvic exam.   UPT positive  Assessment & Plan:   Problem List Items Addressed This Visit   None Visit Diagnoses     Visit for confirmation of pregnancy test result with physical exam    -  Primary   Relevant Orders   Pregnancy, urine   Vaginal bleeding during pregnancy       Relevant Orders   hCG, quantitative, pregnancy   hCG, quantitative, pregnancy      Plan: UPT positive. Bleeding is moderate today. hCG today and will return on Monday for repeat. Continue prenatal vitamin.      Olivia Mackie DNP, 12:42 PM 12/11/2021

## 2021-12-12 LAB — HCG, QUANTITATIVE, PREGNANCY: HCG, Total, QN: 114 m[IU]/mL

## 2021-12-14 ENCOUNTER — Other Ambulatory Visit: Payer: 59

## 2021-12-14 ENCOUNTER — Encounter: Payer: Self-pay | Admitting: Nurse Practitioner

## 2021-12-14 DIAGNOSIS — O4690 Antepartum hemorrhage, unspecified, unspecified trimester: Secondary | ICD-10-CM | POA: Diagnosis not present

## 2021-12-14 NOTE — Telephone Encounter (Signed)
Spoke with patient in person during lab visit.

## 2021-12-15 ENCOUNTER — Encounter: Payer: Self-pay | Admitting: Nurse Practitioner

## 2021-12-15 LAB — HCG, QUANTITATIVE, PREGNANCY: HCG, Total, QN: 16 m[IU]/mL

## 2021-12-16 ENCOUNTER — Other Ambulatory Visit: Payer: Self-pay

## 2021-12-16 ENCOUNTER — Ambulatory Visit: Payer: 59 | Admitting: Nurse Practitioner

## 2021-12-16 DIAGNOSIS — Z3491 Encounter for supervision of normal pregnancy, unspecified, first trimester: Secondary | ICD-10-CM

## 2021-12-25 ENCOUNTER — Ambulatory Visit: Payer: 59 | Admitting: Podiatry

## 2021-12-29 ENCOUNTER — Telehealth: Payer: Self-pay | Admitting: *Deleted

## 2021-12-29 NOTE — Telephone Encounter (Signed)
Left message to discuss FMLA forms

## 2021-12-31 ENCOUNTER — Ambulatory Visit: Payer: 59 | Admitting: Podiatry

## 2021-12-31 ENCOUNTER — Encounter: Payer: Self-pay | Admitting: Podiatry

## 2022-01-06 ENCOUNTER — Encounter: Payer: Self-pay | Admitting: *Deleted

## 2022-01-07 ENCOUNTER — Other Ambulatory Visit: Payer: 59 | Admitting: Nurse Practitioner

## 2022-01-07 ENCOUNTER — Other Ambulatory Visit: Payer: 59

## 2022-01-08 ENCOUNTER — Ambulatory Visit: Payer: 59 | Admitting: Podiatry

## 2022-01-10 ENCOUNTER — Encounter: Payer: Self-pay | Admitting: Nurse Practitioner

## 2022-01-12 ENCOUNTER — Encounter: Payer: Self-pay | Admitting: *Deleted

## 2022-01-14 ENCOUNTER — Encounter: Payer: Self-pay | Admitting: Obstetrics and Gynecology

## 2022-02-26 ENCOUNTER — Other Ambulatory Visit (HOSPITAL_COMMUNITY): Payer: Self-pay

## 2022-04-16 ENCOUNTER — Ambulatory Visit: Payer: 59 | Admitting: Radiology

## 2022-04-16 ENCOUNTER — Other Ambulatory Visit (HOSPITAL_COMMUNITY): Payer: Self-pay

## 2022-04-16 ENCOUNTER — Encounter: Payer: Self-pay | Admitting: Radiology

## 2022-04-16 ENCOUNTER — Other Ambulatory Visit (HOSPITAL_COMMUNITY)
Admission: RE | Admit: 2022-04-16 | Discharge: 2022-04-16 | Disposition: A | Discharge status: Home / Self Care | Payer: 59 | Source: Ambulatory Visit | Attending: Radiology | Admitting: Radiology

## 2022-04-16 VITALS — BP 118/78 | Ht 61.0 in | Wt 226.0 lb

## 2022-04-16 DIAGNOSIS — Z01419 Encounter for gynecological examination (general) (routine) without abnormal findings: Secondary | ICD-10-CM | POA: Diagnosis not present

## 2022-04-16 DIAGNOSIS — Z113 Encounter for screening for infections with a predominantly sexual mode of transmission: Secondary | ICD-10-CM | POA: Insufficient documentation

## 2022-04-16 DIAGNOSIS — Z3009 Encounter for other general counseling and advice on contraception: Secondary | ICD-10-CM

## 2022-04-16 MED ORDER — ANNOVERA 0.013-0.15 MG/24HR VA RING: 1.0000 | VAGINAL_RING | Freq: Once | VAGINAL | 0 refills | Status: DC

## 2022-04-16 NOTE — Progress Notes (Signed)
Erica Boyer 1985-09-16 [id number]   History:  36 y.o. G3P1 presents for annual exam.Complains of intermittent right breast swelling, right axilla swelling x's 1 year. Concerned right breast has stayed larger than the left. Wants to discuss birth control options for cycle control (recently separated from husband & not sexually active).   Gynecologic History Patient's last menstrual period was 03/28/2022 (exact date). Period Cycle (Days): 28 Period Duration (Days): 7 Period Pattern: Regular Menstrual Flow: Heavy Menstrual Control: Tampon, Maxi pad Dysmenorrhea: (!) Moderate Dysmenorrhea Symptoms: Cramping Contraception/Family planning: abstinence Sexually active: yes Last Pap: 2020. Results were: normal Last mammogram: 2022. Results were: normal  Obstetric History OB History  Gravida Para Term Preterm AB Living  3 1 1   2 1   SAB IAB Ectopic Multiple Live Births  2            # Outcome Date GA Lbr Len/2nd Weight Sex Delivery Anes PTL Lv  3 SAB           2 SAB           1 Term              The following portions of the patient's history were reviewed and updated as appropriate: allergies, current medications, past family history, past medical history, past social history, past surgical history, and problem list.  Review of Systems Pertinent items noted in HPI and remainder of comprehensive ROS otherwise negative.   Past medical history, past surgical history, family history and social history were all reviewed and documented in the EPIC chart.   Exam:  Vitals:   04/16/22 1402  BP: 118/78  Weight: 226 lb (102.5 kg)  Height: 5\' 1"  (1.549 m)   Body mass index is 42.7 kg/m.  General appearance:  Normal, obese Thyroid:  Symmetrical, normal in size, without palpable masses or nodularity. Respiratory  Auscultation:  Clear without wheezing or rhonchi Cardiovascular  Auscultation:  Regular rate, without rubs, murmurs or gallops  Edema/varicosities:  Not grossly  evident Abdominal  Soft,nontender, without masses, guarding or rebound.  Liver/spleen:  No organomegaly noted  Hernia:  None appreciated  Skin  Inspection:  Grossly normal Breasts: Examined lying and sitting.   Right: Without masses, retractions, nipple discharge or axillary adenopathy.   Left: Without masses, retractions, nipple discharge or axillary adenopathy. Genitourinary   Inguinal/mons:  Normal without inguinal adenopathy  External genitalia:  Normal appearing vulva with no masses, tenderness, or lesions  BUS/Urethra/Skene's glands:  Normal without masses or exudate  Vagina:  Normal appearing with normal color and discharge, no lesions  Cervix:  Normal appearing without discharge or lesions  Uterus:  Normal in size, shape and contour.  Mobile, nontender  Adnexa/parametria:     Rt: Normal in size, without masses or tenderness.   Lt: Normal in size, without masses or tenderness.  Anus and perineum: Normal   Patient informed chaperone available to be present for breast and pelvic exam. Patient has requested no chaperone to be present. Patient has been advised what will be completed during breast and pelvic exam.   Assessment/Plan:   1. Well woman exam with routine gynecological exam - Cytology - PAP( [facility])  2. Screening for STDs (sexually transmitted diseases) - HIV antibody (with reflex) - RPR - Hepatitis C antibody - Hepatitis B Surface AntiGEN - Cytology - PAP( [place])  3. Encounter for counseling regarding initiation of other contraceptive measure Unable to remember to take pills, may use ring continuously - ANNOVERA 0.013-0.15  MG/24HR RING; Place 1 Ring vaginally once for 1 dose. Leave ring in continuously for 1 year  Dispense: 1 each; Refill: 0   Discussed SBE, pap and STI screening as directed/appropriate. Recommend 131mins of exercise weekly, including weight bearing exercise. Encouraged the use of seatbelts and sunscreen. Return in 1 year for annual  or as needed.   Rubbie Battiest B WHNP-BC 2:31 PM 04/16/2022

## 2022-04-17 ENCOUNTER — Other Ambulatory Visit: Payer: Self-pay | Admitting: Radiology

## 2022-04-17 ENCOUNTER — Other Ambulatory Visit (HOSPITAL_COMMUNITY): Payer: Self-pay

## 2022-04-17 DIAGNOSIS — Z3009 Encounter for other general counseling and advice on contraception: Secondary | ICD-10-CM

## 2022-04-19 ENCOUNTER — Other Ambulatory Visit (HOSPITAL_COMMUNITY): Payer: Self-pay

## 2022-04-19 LAB — HEPATITIS C ANTIBODY: Hepatitis C Ab: NONREACTIVE

## 2022-04-19 LAB — HIV ANTIBODY (ROUTINE TESTING W REFLEX): HIV 1&2 Ab, 4th Generation: NONREACTIVE

## 2022-04-19 LAB — HEPATITIS B SURFACE ANTIGEN: Hepatitis B Surface Ag: NONREACTIVE

## 2022-04-19 LAB — RPR: RPR Ser Ql: NONREACTIVE

## 2022-04-19 MED ORDER — ANNOVERA 0.013-0.15 MG/24HR VA RING
1.0000 | VAGINAL_RING | Freq: Once | VAGINAL | 0 refills | Status: AC
  Filled 2022-04-19: qty 1, 365d supply, fill #0

## 2022-04-19 NOTE — Telephone Encounter (Signed)
Med refill request: Annovera Ring Last AEX: 04/16/22 -JC Next AEX: Not scheduled Last MMG (if hormonal med) N/A  Per review of AEX dated 04/16/22 Rx sent. See patient MyChart message dated 04/17/22, requesting Rx to [facility].   Rx sent.  My Chart message to patient notifying.   Routing to provider for final review. Patient is agreeable to disposition. Will close encounter.

## 2022-04-20 ENCOUNTER — Other Ambulatory Visit (HOSPITAL_COMMUNITY): Payer: Self-pay

## 2022-04-21 LAB — CYTOLOGY - PAP
Chlamydia: NEGATIVE
Comment: NEGATIVE
Comment: NEGATIVE
Comment: NORMAL
Diagnosis: NEGATIVE
Neisseria Gonorrhea: NEGATIVE
Trichomonas: NEGATIVE

## 2022-04-22 ENCOUNTER — Encounter: Payer: 59 | Admitting: Medical-Surgical

## 2022-04-27 ENCOUNTER — Other Ambulatory Visit (HOSPITAL_COMMUNITY): Payer: Self-pay

## 2022-04-29 ENCOUNTER — Other Ambulatory Visit (HOSPITAL_COMMUNITY): Payer: Self-pay

## 2022-04-29 ENCOUNTER — Encounter: Payer: Self-pay | Admitting: Medical-Surgical

## 2022-04-29 ENCOUNTER — Ambulatory Visit (INDEPENDENT_AMBULATORY_CARE_PROVIDER_SITE_OTHER): Payer: 59 | Admitting: Medical-Surgical

## 2022-04-29 VITALS — BP 104/72 | HR 81 | Wt 226.0 lb

## 2022-04-29 DIAGNOSIS — Z8639 Personal history of other endocrine, nutritional and metabolic disease: Secondary | ICD-10-CM | POA: Diagnosis not present

## 2022-04-29 DIAGNOSIS — F418 Other specified anxiety disorders: Secondary | ICD-10-CM

## 2022-04-29 DIAGNOSIS — Z8349 Family history of other endocrine, nutritional and metabolic diseases: Secondary | ICD-10-CM | POA: Diagnosis not present

## 2022-04-29 DIAGNOSIS — Z0000 Encounter for general adult medical examination without abnormal findings: Secondary | ICD-10-CM

## 2022-04-29 DIAGNOSIS — Z833 Family history of diabetes mellitus: Secondary | ICD-10-CM | POA: Diagnosis not present

## 2022-04-29 MED ORDER — FLUTICASONE PROPIONATE 50 MCG/ACT NA SUSP
2.0000 | Freq: Every day | NASAL | 5 refills | Status: DC
  Filled 2022-04-29: qty 16, 30d supply, fill #0
  Filled 2022-08-05: qty 16, 30d supply, fill #1

## 2022-04-29 MED ORDER — BUPROPION HCL ER (XL) 150 MG PO TB24
150.0000 mg | ORAL_TABLET | Freq: Every day | ORAL | 1 refills | Status: DC
  Filled 2022-04-29: qty 90, 90d supply, fill #0

## 2022-04-29 MED ORDER — ESCITALOPRAM OXALATE 10 MG PO TABS
10.0000 mg | ORAL_TABLET | Freq: Every day | ORAL | 3 refills | Status: DC
  Filled 2022-04-29 – 2022-06-30 (×2): qty 90, 90d supply, fill #0
  Filled 2022-08-05 – 2022-10-22 (×2): qty 90, 90d supply, fill #1

## 2022-04-29 MED ORDER — ALBUTEROL SULFATE HFA 108 (90 BASE) MCG/ACT IN AERS
2.0000 | INHALATION_SPRAY | RESPIRATORY_TRACT | 6 refills | Status: DC | PRN
  Filled 2022-04-29: qty 6.7, 17d supply, fill #0
  Filled 2022-08-05: qty 6.7, 17d supply, fill #1

## 2022-04-29 MED ORDER — HYDROXYZINE HCL 25 MG PO TABS
12.5000 mg | ORAL_TABLET | Freq: Three times a day (TID) | ORAL | 3 refills | Status: DC | PRN
  Filled 2022-04-29: qty 60, 10d supply, fill #0

## 2022-04-29 NOTE — Progress Notes (Signed)
Complete physical exam  Patient: Erica Boyer   DOB: 1986-01-26   36 y.o. Female  MRN: [id number]  Subjective:    Chief Complaint  Patient presents with   Annual Exam    Erica Boyer is a 36 y.o. female who presents today for a complete physical exam. She reports consuming a general and avoids fructose and lactose  diet. Home exercise routine includes walking outside or on the treadmill. She generally feels well. She reports sleeping poorly. She does not have additional problems to discuss today.    Most recent fall risk assessment:    12/22/2020    3:55 PM  Fall Risk   Falls in the past year? 0  Number falls in past yr: 0  Injury with Fall? 0  Follow up Falls evaluation completed     Most recent depression screenings:    02/02/2021   10:33 AM 12/22/2020    3:55 PM  PHQ 2/9 Scores  PHQ - 2 Score 0 0  PHQ- 9 Score 3 9    Vision:Within last year and Dental: No current dental problems and Receives regular dental care    Patient Care Team: Christen Butter, NP as PCP - General (Nurse Practitioner)   Outpatient Medications Prior to Visit  Medication Sig   Cetirizine HCl (ZYRTEC ALLERGY) 10 MG CAPS Take 1 capsule (10 mg total) by mouth daily.   cholecalciferol (VITAMIN D3) 25 MCG (1000 UNIT) tablet Take 1 tablet (1,000 Units total) by mouth daily.   DENTA 5000 PLUS 1.1 % CREA dental cream Take by mouth 2 (two) times daily.   ferrous sulfate 325 (65 FE) MG tablet Take by mouth.   omeprazole (PRILOSEC) 40 MG capsule Take 1 capsule (40 mg total) by mouth daily.   [DISCONTINUED] albuterol (VENTOLIN HFA) 108 (90 Base) MCG/ACT inhaler Inhale 2 puffs into the lungs every 4 (four) hours as needed for wheezing or shortness of breath.   [DISCONTINUED] escitalopram (LEXAPRO) 10 MG tablet Take 1 tablet (10 mg total) by mouth daily.   [DISCONTINUED] fluticasone (FLONASE) 50 MCG/ACT nasal spray Place into both nostrils daily.   No facility-administered medications prior to visit.     Review of Systems  Constitutional:  Positive for malaise/fatigue. Negative for chills, fever and weight loss.  HENT:  Negative for congestion, ear pain, hearing loss, sinus pain and sore throat.   Eyes:  Negative for blurred vision, photophobia and pain.  Respiratory:  Negative for cough, shortness of breath and wheezing.   Cardiovascular:  Negative for chest pain, palpitations and leg swelling.  Gastrointestinal:  Negative for abdominal pain, constipation, diarrhea, heartburn, nausea and vomiting.  Genitourinary:  Negative for dysuria, frequency and urgency.  Musculoskeletal:  Negative for falls and neck pain.  Skin:  Negative for itching and rash.  Neurological:  Negative for dizziness, weakness and headaches.  Endo/Heme/Allergies:  Negative for polydipsia. Does not bruise/bleed easily.  Psychiatric/Behavioral:  Negative for depression, substance abuse and suicidal ideas. The patient has insomnia. The patient is not nervous/anxious.        Irritability, poor motivation     Objective:    BP 104/72   Pulse 81   Wt 226 lb (102.5 kg)   LMP 03/28/2022 (Exact Date)   SpO2 99%   BMI 42.70 kg/m    Physical Exam Constitutional:      General: She is not in acute distress.    Appearance: Normal appearance. She is obese. She is not ill-appearing.  HENT:  Head: Normocephalic and atraumatic.     Right Ear: Tympanic membrane, ear canal and external ear normal. There is no impacted cerumen.     Left Ear: Tympanic membrane, ear canal and external ear normal. There is no impacted cerumen.     Nose: Nose normal. No congestion or rhinorrhea.     Mouth/Throat:     Mouth: Mucous membranes are moist.     Pharynx: No oropharyngeal exudate or posterior oropharyngeal erythema.  Eyes:     General: No scleral icterus.       Right eye: No discharge.        Left eye: No discharge.     Extraocular Movements: Extraocular movements intact.     Conjunctiva/sclera: Conjunctivae normal.      Pupils: Pupils are equal, round, and reactive to light.  Neck:     Thyroid: No thyromegaly.     Vascular: No carotid bruit or JVD.     Trachea: Trachea normal.  Cardiovascular:     Rate and Rhythm: Normal rate and regular rhythm.     Pulses: Normal pulses.     Heart sounds: Normal heart sounds. No murmur heard.    No friction rub. No gallop.  Pulmonary:     Effort: Pulmonary effort is normal. No respiratory distress.     Breath sounds: Normal breath sounds. No wheezing.  Abdominal:     General: Bowel sounds are normal. There is no distension.     Palpations: Abdomen is soft.     Tenderness: There is abdominal tenderness (Mild to right lower hydrant and left upper quadrant). There is no guarding.  Musculoskeletal:        General: Normal range of motion.     Cervical back: Normal range of motion and neck supple.  Lymphadenopathy:     Cervical: No cervical adenopathy.  Skin:    General: Skin is warm and dry.  Neurological:     Mental Status: She is alert and oriented to person, place, and time.     Cranial Nerves: No cranial nerve deficit.  Psychiatric:        Mood and Affect: Mood normal.        Behavior: Behavior normal.        Thought Content: Thought content normal.        Judgment: Judgment normal.   No results found for any visits on 04/29/22.     Assessment & Plan:    Routine Health Maintenance and Physical Exam  Immunization History  Administered Date(s) Administered   Influenza,inj,Quad PF,6+ Mos 02/16/2021   Influenza-Unspecified 03/26/2020   Moderna Sars-Covid-2 Vaccination 06/17/2019, 07/11/2019, 04/29/2020   PPD Test 07/10/2019    Health Maintenance  Topic Date Due   COVID-19 Vaccine (4 - Moderna risk series) 06/24/2020   INFLUENZA VACCINE  01/12/2022   TETANUS/TDAP  11/14/2022 (Originally 01/16/2005)   PAP SMEAR-Modifier  04/16/2025   Hepatitis C Screening  Completed   HIV Screening  Completed   HPV VACCINES  Aged Out    Discussed health benefits of  physical activity, and encouraged her to engage in regular exercise appropriate for her age and condition.  1. Annual physical exam Checking labs as below. UTD on preventative care, recommend updating dental care soon. Wellness information provided with AVS. - Lipid panel - COMPLETE METABOLIC PANEL WITH GFR - CBC with Differential/Platelet  2. Anxiety with depression Stable.  Continue Lexapro 10 mg daily.  Use of hydroxyzine.  Some recent issues with poor motivation and no energy.  Some correlation with seasonal affective disorder.  Adding Wellbutrin 150 mg daily. - escitalopram (LEXAPRO) 10 MG tablet; Take 1 tablet (10 mg total) by mouth daily.  Dispense: 90 tablet; Refill: 3 - hydrOXYzine (ATARAX) 25 MG tablet; Take 1/2 to 2 tablets (12.5-50 mg total) by mouth 3 times daily as needed.  Dispense: 60 tablet; Refill: 3  3. History of vitamin D deficiency Checking vitamin D. - VITAMIN D 25 Hydroxy (Vit-D Deficiency, Fractures)  4. Family history of diabetes mellitus Checking hemoglobin A1c. - Hemoglobin A1c  5. Family history of thyroid disease Checking TSH. - TSH  Return in about 1 year (around 04/30/2023) for annual physical exam.   Christen Butter, NP

## 2022-04-30 ENCOUNTER — Other Ambulatory Visit (HOSPITAL_COMMUNITY): Payer: Self-pay

## 2022-05-11 ENCOUNTER — Ambulatory Visit: Payer: 59 | Admitting: Family Medicine

## 2022-05-11 ENCOUNTER — Encounter: Payer: Self-pay | Admitting: Family Medicine

## 2022-05-11 VITALS — BP 119/81 | HR 106 | Temp 101.3°F | Ht 61.0 in | Wt 225.0 lb

## 2022-05-11 DIAGNOSIS — U071 COVID-19: Secondary | ICD-10-CM

## 2022-05-11 DIAGNOSIS — R051 Acute cough: Secondary | ICD-10-CM | POA: Diagnosis not present

## 2022-05-11 LAB — POC COVID19 BINAXNOW: SARS Coronavirus 2 Ag: POSITIVE — AB

## 2022-05-11 LAB — POCT INFLUENZA A/B
Influenza A, POC: NEGATIVE
Influenza B, POC: NEGATIVE

## 2022-05-11 NOTE — Progress Notes (Signed)
   Acute Office Visit  Subjective:     Patient ID: Erica Boyer, female    DOB: 22-Jul-1985, 36 y.o.   MRN: [id number]  Chief Complaint  Patient presents with   Sore Throat         HPI Patient is in today for ST that started on Friday, 5 days ago. Also dry frequent cough and SOB. Using lozenges.  HA started yesterday 8/10.  Using tylenol for that.  + chills and sweats, and fever .  Has had flu vaccine.  History of asthma.  Has been using her albuterol some.  ROS      Objective:    BP 119/81   Pulse (!) 106   Temp (!) 101.3 F (38.5 C)   Ht 5\' 1"  (1.549 m)   Wt 225 lb (102.1 kg)   LMP 03/28/2022 (Exact Date)   SpO2 99%   BMI 42.51 kg/m    Physical Exam Constitutional:      Appearance: She is well-developed.  HENT:     Head: Normocephalic and atraumatic.     Comments: Mild conjunctivitis.      Right Ear: Tympanic membrane, ear canal and external ear normal.     Left Ear: Tympanic membrane, ear canal and external ear normal.     Nose: Nose normal. No rhinorrhea.     Mouth/Throat:     Pharynx: Pharyngeal swelling present. No oropharyngeal exudate.     Tonsils: Tonsillar abscess present.  Eyes:     Conjunctiva/sclera: Conjunctivae normal.     Pupils: Pupils are equal, round, and reactive to light.  Neck:     Thyroid: No thyromegaly.  Cardiovascular:     Rate and Rhythm: Normal rate and regular rhythm.     Heart sounds: Normal heart sounds.  Pulmonary:     Effort: Pulmonary effort is normal.     Breath sounds: Normal breath sounds. No wheezing.  Musculoskeletal:     Cervical back: Neck supple.  Lymphadenopathy:     Cervical: No cervical adenopathy.  Skin:    General: Skin is warm and dry.  Neurological:     Mental Status: She is alert and oriented to person, place, and time.     Results for orders placed or performed in visit on 05/11/22  POC COVID-19  Result Value Ref Range   SARS Coronavirus 2 Ag Positive (A) Negative  POCT Influenza A/B  Result  Value Ref Range   Influenza A, POC Negative Negative   Influenza B, POC Negative Negative        Assessment & Plan:   Problem List Items Addressed This Visit   None Visit Diagnoses     Acute cough    -  Primary   Relevant Orders   POC COVID-19 (Completed)   POCT Influenza A/B (Completed)   COVID-19          COVID 19-recommend symptomatic care.  She is really out of the window to benefit from Paxlovid.  Okay to use albuterol inhaler liberally she says she has an up-to-date inhaler at home.  Call if not improving or getting worse.  Work note provided to be out for the rest of the week but did encourage her to check with her work on their protocols and to let 05/13/22 know if we need to extend her note.  No orders of the defined types were placed in this encounter.   No follow-ups on file.  [place], MD

## 2022-05-14 ENCOUNTER — Encounter: Payer: Self-pay | Admitting: Medical-Surgical

## 2022-05-14 NOTE — Telephone Encounter (Signed)
You saw patient 05/11/2022 - please advise.

## 2022-05-18 ENCOUNTER — Ambulatory Visit: Payer: 59 | Admitting: Physician Assistant

## 2022-06-06 ENCOUNTER — Telehealth: Payer: 59 | Admitting: Nurse Practitioner

## 2022-06-06 DIAGNOSIS — R399 Unspecified symptoms and signs involving the genitourinary system: Secondary | ICD-10-CM | POA: Diagnosis not present

## 2022-06-06 MED ORDER — NITROFURANTOIN MONOHYD MACRO 100 MG PO CAPS: 100.0000 mg | ORAL_CAPSULE | Freq: Two times a day (BID) | ORAL | 0 refills | Status: AC

## 2022-06-06 NOTE — Progress Notes (Signed)
I have spent 5 minutes in review of e-visit questionnaire, review and updating patient chart, medical decision making and response to patient.  ° °Assia Meanor W Lavine Hargrove, NP ° °  °

## 2022-06-06 NOTE — Progress Notes (Signed)

## 2022-06-30 ENCOUNTER — Other Ambulatory Visit (HOSPITAL_COMMUNITY): Payer: Self-pay

## 2022-07-14 DIAGNOSIS — Z6841 Body Mass Index (BMI) 40.0 and over, adult: Secondary | ICD-10-CM | POA: Diagnosis not present

## 2022-07-14 DIAGNOSIS — J029 Acute pharyngitis, unspecified: Secondary | ICD-10-CM | POA: Diagnosis not present

## 2022-07-14 DIAGNOSIS — J039 Acute tonsillitis, unspecified: Secondary | ICD-10-CM | POA: Diagnosis not present

## 2022-07-22 ENCOUNTER — Ambulatory Visit (INDEPENDENT_AMBULATORY_CARE_PROVIDER_SITE_OTHER): Payer: 59 | Admitting: Sports Medicine

## 2022-07-22 ENCOUNTER — Ambulatory Visit (INDEPENDENT_AMBULATORY_CARE_PROVIDER_SITE_OTHER): Payer: 59

## 2022-07-22 DIAGNOSIS — M5412 Radiculopathy, cervical region: Secondary | ICD-10-CM

## 2022-07-22 MED ORDER — GABAPENTIN 300 MG PO CAPS: ORAL_CAPSULE | ORAL | 3 refills | Status: DC

## 2022-07-22 MED ORDER — PREDNISONE 50 MG PO TABS: ORAL_TABLET | ORAL | 0 refills | Status: DC

## 2022-07-22 NOTE — Progress Notes (Signed)
    Procedures performed today:    None.  Independent interpretation of notes and tests performed by another provider:   None.  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 She has only minimal weakness on the left. Exam is for the most part unrevealing with exception of slightly asymmetric weakness on finger abduction on the left side. We did discuss the anatomy and pathophysiology, adding x-rays, prednisone, Neurontin at night, formal physical therapy, will do lifting restrictions at work, return to see me in 6 weeks.    ____________________________________________ Gwen Her. Dianah Field, M.D., ABFM., CAQSM., AME. Primary Care and Sports Medicine [facility]  Adjunct Professor of [place] [employer]  Risk manager

## 2022-07-22 NOTE — Assessment & Plan Note (Signed)
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 She has only minimal weakness on the left. Exam is for the most part unrevealing with exception of slightly asymmetric weakness on finger abduction on the left side. We did discuss the anatomy and pathophysiology, adding x-rays, prednisone, Neurontin at night, formal physical therapy, will do lifting restrictions at work, return to see me in 6 weeks.

## 2022-07-23 ENCOUNTER — Encounter: Payer: Self-pay | Admitting: Sports Medicine

## 2022-07-23 ENCOUNTER — Telehealth: Payer: Self-pay | Admitting: *Deleted

## 2022-07-23 NOTE — Telephone Encounter (Signed)
Patient has open order for OB PUS ordered 12/16/21 and PUS 11/24/21 for AUB by Jonelle Sidle, NP.   Per review of EPIC, patient seen by Wende Crease, NP for AEX 04/16/22. No recommendations for PUS.  Orders cancelled.   Routing to [place], NP FYI.   Encounter closed.

## 2022-08-04 NOTE — Therapy (Deleted)
OUTPATIENT PHYSICAL THERAPY CERVICAL EVALUATION   Patient Name: Erica Boyer MRN: [id number] DOB:04-13-1986, 37 y.o., female Today's Date: 08/04/2022  END OF SESSION:   Past Medical History:  Diagnosis Date   Asthma    Fructose intolerance    GERD (gastroesophageal reflux disease)    Lactose intolerance    Past Surgical History:  Procedure Laterality Date   NO PAST SURGERIES     Patient Active Problem List   Diagnosis Date Noted   Left cervical radiculopathy 07/22/2022   Seasonal allergies 11/13/2021   Gastroesophageal reflux disease 11/13/2021   History of vitamin D deficiency 11/13/2021   Patellofemoral syndrome, left 03/09/2021   Left ankle sprain 03/09/2021   Anxiety with depression 02/02/2021   Migraines 03/29/2006   Asthma 11/27/2003    PCP: Samuel Bouche, NP  REFERRING PROVIDER: Dr Aundria Mems  REFERRING DIAG: Lt cervical radiculopathy   THERAPY DIAG:  No diagnosis found.  Rationale for Evaluation and Treatment: Rehabilitation  ONSET DATE: ***  SUBJECTIVE:                                                                                                                                                                                                         SUBJECTIVE STATEMENT: ***  PERTINENT HISTORY:  ***  PAIN:  Are you having pain? Yes: NPRS scale: ***/10 Pain location: *** Pain description: *** Aggravating factors: *** Relieving factors: ***  PRECAUTIONS: None  WEIGHT BEARING RESTRICTIONS: No  FALLS:  Has patient fallen in last 6 months? No  LIVING ENVIRONMENT: Lives with: lives with their family Lives in: House/apartment   OCCUPATION: LPN for [employer]  PLOF: Independent  PATIENT GOALS: ***  NEXT MD VISIT: ***  OBJECTIVE:   DIAGNOSTIC FINDINGS:  Xrays - 07/23/22 - Negative cervical spine radiographs  PATIENT SURVEYS:  FOTO ***  COGNITION: Overall cognitive status: Within functional limits for tasks  assessed  SENSATION: {sensation:27233}  POSTURE: Patient presents with head forward posture with increased thoracic kyphosis; shoulders rounded and elevated; scapulae abducted and rotated along the thoracic spine; head of the humerus anterior in orientation.  PALPATION: ***   CERVICAL ROM:   Active ROM A/PROM (deg) eval  Flexion   Extension   Right lateral flexion   Left lateral flexion   Right rotation   Left rotation    (Blank rows = not tested)  UPPER EXTREMITY ROM:  Active ROM Right eval Left eval  Shoulder flexion    Shoulder extension    Shoulder abduction    Shoulder adduction    Shoulder extension  Shoulder internal rotation    Shoulder external rotation    Elbow flexion    Elbow extension    Wrist flexion    Wrist extension    Wrist ulnar deviation    Wrist radial deviation    Wrist pronation    Wrist supination     (Blank rows = not tested)  UPPER EXTREMITY MMT:  MMT Right eval Left eval  Shoulder flexion    Shoulder extension    Shoulder abduction    Shoulder adduction    Shoulder extension    Shoulder internal rotation    Shoulder external rotation    Middle trapezius    Lower trapezius    Elbow flexion    Elbow extension    Wrist flexion    Wrist extension    Wrist ulnar deviation    Wrist radial deviation    Wrist pronation    Wrist supination    Grip strength     (Blank rows = not tested)  CERVICAL SPECIAL TESTS:  {Cervical special tests:25246}  [facility] Adult PT Treatment:                                                DATE: *** Therapeutic Exercise: *** Manual Therapy: *** Neuromuscular re-ed: *** Therapeutic Activity: *** Gait: *** Modalities: *** Self Care: ***   PATIENT EDUCATION:  Education details: POC; HEP Person educated: Patient Education method: Consulting civil engineer, Media planner, Corporate treasurer cues, Verbal cues, and Handouts Education comprehension: verbalized understanding, returned demonstration, verbal cues  required, tactile cues required, and needs further education  HOME EXERCISE PROGRAM: ***  ASSESSMENT:  CLINICAL IMPRESSION: Patient is a 37 y.o. female who was seen today for physical therapy evaluation and treatment for Lt cervical radiculopathy.   OBJECTIVE IMPAIRMENTS: decreased activity tolerance, decreased mobility, decreased ROM, decreased strength, hypomobility, increased fascial restrictions, increased muscle spasms, impaired flexibility, impaired UE functional use, improper body mechanics, postural dysfunction, and pain.   ACTIVITY LIMITATIONS: carrying, lifting, and reach over head  PARTICIPATION LIMITATIONS: {participationrestrictions:25113}  PERSONAL FACTORS: {Personal factors:25162} are also affecting patient's functional outcome.   REHAB POTENTIAL: Good  CLINICAL DECISION MAKING: Stable/uncomplicated  EVALUATION COMPLEXITY: Low   GOALS: Goals reviewed with patient? Yes  SHORT TERM GOALS: Target date: ***  *** Baseline:  Goal status: INITIAL  2.  *** Baseline:  Goal status: INITIAL    LONG TERM GOALS: Target date: ***  *** Baseline:  Goal status: INITIAL  2.  *** Baseline:  Goal status: INITIAL  3.  *** Baseline:  Goal status: INITIAL  4.  *** Baseline:  Goal status: INITIAL  5.  *** Baseline:  Goal status: INITIAL  6.  *** Baseline:  Goal status: INITIAL   PLAN:  PT FREQUENCY: 2x/week  PT DURATION: 8 weeks  PLANNED INTERVENTIONS: Therapeutic exercises, Therapeutic activity, Neuromuscular re-education, Patient/Family education, Self Care, Joint mobilization, Aquatic Therapy, Dry Needling, Electrical stimulation, Spinal mobilization, Cryotherapy, Moist heat, Traction, Ultrasound, Ionotophoresis 52m/ml Dexamethasone, Manual therapy, and Re-evaluation  PLAN FOR NEXT SESSION: review and progress exercises; continue with postural correction and strengthening; manual work, DN, modalities as indicated    [employer] PT 08/04/2022,  1:51 PM

## 2022-08-05 ENCOUNTER — Other Ambulatory Visit: Payer: Self-pay

## 2022-08-05 ENCOUNTER — Telehealth: Payer: 59 | Admitting: Family Medicine

## 2022-08-05 ENCOUNTER — Other Ambulatory Visit (HOSPITAL_COMMUNITY): Payer: Self-pay

## 2022-08-05 ENCOUNTER — Encounter (HOSPITAL_COMMUNITY): Payer: Self-pay

## 2022-08-05 DIAGNOSIS — J069 Acute upper respiratory infection, unspecified: Secondary | ICD-10-CM

## 2022-08-05 MED ORDER — BENZONATATE 100 MG PO CAPS: 100.0000 mg | ORAL_CAPSULE | Freq: Three times a day (TID) | ORAL | 0 refills | Status: DC | PRN

## 2022-08-05 MED ORDER — PSEUDOEPH-BROMPHEN-DM 30-2-10 MG/5ML PO SYRP: 5.0000 mL | ORAL_SOLUTION | Freq: Four times a day (QID) | ORAL | 0 refills | Status: DC | PRN

## 2022-08-05 MED ORDER — IPRATROPIUM BROMIDE 0.03 % NA SOLN: 2.0000 | Freq: Two times a day (BID) | NASAL | 0 refills | Status: DC

## 2022-08-05 NOTE — Progress Notes (Signed)
E-Visit for Upper Respiratory Infection   We are sorry you are not feeling well.  Here is how we plan to help!  Based on what you have shared with me, it looks like you may have a viral upper respiratory infection.  Upper respiratory infections are caused by a large number of viruses; however, rhinovirus is the most common cause.   Symptoms vary from person to person, with common symptoms including sore throat, cough, fatigue or lack of energy and feeling of general discomfort.  A low-grade fever of up to 100.4 may present, but is often uncommon.  Symptoms vary however, and are closely related to a person's age or underlying illnesses.  The most common symptoms associated with an upper respiratory infection are nasal discharge or congestion, cough, sneezing, headache and pressure in the ears and face.  These symptoms usually persist for about 3 to 10 days, but can last up to 2 weeks.  It is important to know that upper respiratory infections do not cause serious illness or complications in most cases.    Upper respiratory infections can be transmitted from person to person, with the most common method of transmission being a person's hands.  The virus is able to live on the skin and can infect other persons for up to 2 hours after direct contact.  Also, these can be transmitted when someone coughs or sneezes; thus, it is important to cover the mouth to reduce this risk.  To keep the spread of the illness at [place], good hand hygiene is very important.  This is an infection that is most likely caused by a virus. There are no specific treatments other than to help you with the symptoms until the infection runs its course.  We are sorry you are not feeling well.  Here is how we plan to help!   For nasal congestion, you may use an oral decongestants such as Mucinex D or if you have glaucoma or high blood pressure use plain Mucinex.  Saline nasal spray or nasal drops can help and can safely be used as often as  needed for congestion.  For your congestion, I have prescribed Ipratropium Bromide nasal spray 0.03% two sprays in each nostril 2-3 times a day  If you do not have a history of heart disease, hypertension, diabetes or thyroid disease, prostate/bladder issues or glaucoma, you may also use Sudafed to treat nasal congestion.  It is highly recommended that you consult with a pharmacist or your primary care physician to ensure this medication is safe for you to take.     If you have a cough, you may use cough suppressants such as Delsym and Robitussin.  If you have glaucoma or high blood pressure, you can also use Coricidin HBP.   For cough I have prescribed for you A prescription cough medication called Tessalon Perles 100 mg. You may take 1-2 capsules every 8 hours as needed for cough  I will also order a cough syrup to help with congestion and cough- please do not take with OTC cough meds.   If you have a sore or scratchy throat, use a saltwater gargle-  to  teaspoon of salt dissolved in a 4-ounce to 8-ounce glass of warm water.  Gargle the solution for approximately 15-30 seconds and then spit.  It is important not to swallow the solution.  You can also use throat lozenges/cough drops and Chloraseptic spray to help with throat pain or discomfort.  Warm or cold liquids can also  be helpful in relieving throat pain.  For headache, pain or general discomfort, you can use Ibuprofen or Tylenol as directed.   Some authorities believe that zinc sprays or the use of Echinacea may shorten the course of your symptoms.   HOME CARE Only take medications as instructed by your medical team. Be sure to drink plenty of fluids. Water is fine as well as fruit juices, sodas and electrolyte beverages. You may want to stay away from caffeine or alcohol. If you are nauseated, try taking small sips of liquids. How do you know if you are getting enough fluid? Your urine should be a pale yellow or almost colorless. Get  rest. Taking a steamy shower or using a humidifier may help nasal congestion and ease sore throat pain. You can place a towel over your head and breathe in the steam from hot water coming from a faucet. Using a saline nasal spray works much the same way. Cough drops, hard candies and sore throat lozenges may ease your cough. Avoid close contacts especially the very young and the elderly Cover your mouth if you cough or sneeze Always remember to wash your hands.   GET HELP RIGHT AWAY IF: You develop worsening fever. If your symptoms do not improve within 10 days You develop yellow or green discharge from your nose over 3 days. You have coughing fits You develop a severe head ache or visual changes. You develop shortness of breath, difficulty breathing or start having chest pain Your symptoms persist after you have completed your treatment plan  MAKE SURE YOU  Understand these instructions. Will watch your condition. Will get help right away if you are not doing well or get worse.  Thank you for choosing an e-visit.  Your e-visit answers were reviewed by a board certified advanced clinical practitioner to complete your personal care plan. Depending upon the condition, your plan could have included both over the counter or prescription medications.  Please review your pharmacy choice. Make sure the pharmacy is open so you can pick up prescription now. If there is a problem, you may contact your provider through [employer] and have the prescription routed to another pharmacy.  Your safety is important to [place]. If you have drug allergies check your prescription carefully.   For the next 24 hours you can use MyChart to ask questions about today's visit, request a non-urgent call back, or ask for a work or school excuse. You will get an email in the next two days asking about your experience. I hope that your e-visit has been valuable and will speed your recovery.    I provided 5  minutes of non face-to-face time during this encounter for chart review, medication and order placement, as well as and documentation.

## 2022-08-06 ENCOUNTER — Encounter: Payer: Self-pay | Admitting: Emergency Medicine

## 2022-08-06 ENCOUNTER — Telehealth: Payer: 59 | Admitting: Physician Assistant

## 2022-08-06 ENCOUNTER — Other Ambulatory Visit: Payer: Self-pay

## 2022-08-06 ENCOUNTER — Ambulatory Visit
Admission: EM | Admit: 2022-08-06 | Discharge: 2022-08-06 | Disposition: A | Discharge status: Home / Self Care | Payer: 59 | Attending: Urgent Care | Admitting: Urgent Care

## 2022-08-06 ENCOUNTER — Other Ambulatory Visit: Payer: Self-pay | Admitting: Medical-Surgical

## 2022-08-06 DIAGNOSIS — R079 Chest pain, unspecified: Secondary | ICD-10-CM

## 2022-08-06 DIAGNOSIS — J069 Acute upper respiratory infection, unspecified: Secondary | ICD-10-CM

## 2022-08-06 DIAGNOSIS — J4530 Mild persistent asthma, uncomplicated: Secondary | ICD-10-CM | POA: Diagnosis not present

## 2022-08-06 DIAGNOSIS — R0602 Shortness of breath: Secondary | ICD-10-CM

## 2022-08-06 MED ORDER — PROMETHAZINE-DM 6.25-15 MG/5ML PO SYRP: 5.0000 mL | ORAL_SOLUTION | Freq: Three times a day (TID) | ORAL | 0 refills | Status: DC | PRN

## 2022-08-06 MED ORDER — PREDNISONE 50 MG PO TABS: 50.0000 mg | ORAL_TABLET | Freq: Every day | ORAL | 0 refills | Status: DC

## 2022-08-06 MED ORDER — ALBUTEROL SULFATE HFA 108 (90 BASE) MCG/ACT IN AERS: 2.0000 | INHALATION_SPRAY | RESPIRATORY_TRACT | 0 refills | Status: AC | PRN

## 2022-08-06 NOTE — ED Provider Notes (Signed)
[place]  Note:  This document was prepared using Dragon voice recognition software and may include unintentional dictation errors.  MRN: [id number] DOB: [birth year]  Subjective:   Erica Boyer is a 37 y.o. female presenting for 2-day history of acute onset productive cough, chest tightness, shortness of breath, sinus drainage.  Did a COVID test at home and was negative.  She did have COVID about 2 to 3 months ago.  Has been using multiple over-the-counter medications with minimal relief.  Needs a refill on her albuterol inhaler.  Patient is not a smoker.  No current facility-administered medications for this encounter.  Current Outpatient Medications:    albuterol (VENTOLIN HFA) 108 (90 Base) MCG/ACT inhaler, Inhale 2 puffs into the lungs every 4 (four) hours as needed for wheezing or shortness of breath., Disp: 6.7 g, Rfl: 6   benzonatate (TESSALON) 100 MG capsule, Take 1 capsule (100 mg total) by mouth 3 (three) times daily as needed for cough., Disp: 30 capsule, Rfl: 0   brompheniramine-pseudoephedrine-DM 30-2-10 MG/5ML syrup, Take 5 mLs by mouth 4 (four) times daily as needed., Disp: 120 mL, Rfl: 0   buPROPion (WELLBUTRIN XL) 150 MG 24 hr tablet, Take 1 tablet (150 mg total) by mouth daily., Disp: 90 tablet, Rfl: 1   Cetirizine HCl (ZYRTEC ALLERGY) 10 MG CAPS, Take 1 capsule (10 mg total) by mouth daily., Disp: 90 capsule, Rfl: 1   cholecalciferol (VITAMIN D3) 25 MCG (1000 UNIT) tablet, Take 1 tablet (1,000 Units total) by mouth daily., Disp: 90 tablet, Rfl: 1   DENTA 5000 PLUS 1.1 % CREA dental cream, Take by mouth 2 (two) times daily., Disp: , Rfl:    escitalopram (LEXAPRO) 10 MG tablet, Take 1 tablet (10 mg total) by mouth daily., Disp: 90 tablet, Rfl: 3   ferrous sulfate 325 (65 FE) MG tablet, Take by mouth., Disp: , Rfl:    fluticasone (FLONASE) 50 MCG/ACT nasal spray, Place 2 sprays into both nostrils daily., Disp: 16 g, Rfl: 5   gabapentin (NEURONTIN)  300 MG capsule, One tab PO qHS for a week, then BID for a week, then TID. May double weekly to a max of 3,'600mg'$ /day, Disp: 90 capsule, Rfl: 3   hydrOXYzine (ATARAX) 25 MG tablet, Take 1/2 to 2 tablets (12.5-50 mg total) by mouth 3 times daily as needed., Disp: 60 tablet, Rfl: 3   ipratropium (ATROVENT) 0.03 % nasal spray, Place 2 sprays into both nostrils every 12 (twelve) hours., Disp: 30 mL, Rfl: 0   omeprazole (PRILOSEC) 40 MG capsule, Take 1 capsule (40 mg total) by mouth daily., Disp: 90 capsule, Rfl: 3   predniSONE (DELTASONE) 50 MG tablet, One tab PO daily for 5 days., Disp: 5 tablet, Rfl: 0   Allergies  Allergen Reactions   Cephalexin Hives, Itching and Shortness Of Breath   Mometasone Other (See Comments)    Blurred vision   Moxifloxacin Rash    dizziness   Sumatriptan Photosensitivity    Worsens migraine    Sumatriptan-Naproxen Sodium Photosensitivity    Past Medical History:  Diagnosis Date   Asthma    Fructose intolerance    GERD (gastroesophageal reflux disease)    Lactose intolerance      Past Surgical History:  Procedure Laterality Date   NO PAST SURGERIES      Family History  Problem Relation Age of Onset   Hypertension Mother    Sleep apnea Mother    Hyperlipidemia Father    Heart failure Father  Esophageal cancer Father    Diabetes Maternal Grandmother    Heart failure Paternal Grandfather     Social History   Tobacco Use   Smoking status: Never    Passive exposure: Never   Smokeless tobacco: Never  Vaping Use   Vaping Use: Never used  Substance Use Topics   Alcohol use: Yes    Comment: socially   Drug use: Never    ROS   Objective:   Vitals: BP 115/81 (BP Location: Right Arm)   Pulse 92   Temp 98.7 F (37.1 C) (Oral)   Resp 18   Ht '5\' 1"'$  (1.549 m)   Wt 219 lb (99.3 kg)   LMP 08/02/2022   SpO2 97%   BMI 41.38 kg/m   Physical Exam Constitutional:      General: She is not in acute distress.    Appearance: Normal  appearance. She is well-developed and normal weight. She is not ill-appearing, toxic-appearing or diaphoretic.  HENT:     Head: Normocephalic and atraumatic.     Right Ear: Tympanic membrane, ear canal and external ear normal. No drainage or tenderness. No middle ear effusion. There is no impacted cerumen. Tympanic membrane is not erythematous or bulging.     Left Ear: Tympanic membrane, ear canal and external ear normal. No drainage or tenderness.  No middle ear effusion. There is no impacted cerumen. Tympanic membrane is not erythematous or bulging.     Nose: Congestion and rhinorrhea present.     Mouth/Throat:     Mouth: Mucous membranes are moist. No oral lesions.     Pharynx: No pharyngeal swelling, oropharyngeal exudate, posterior oropharyngeal erythema or uvula swelling.     Tonsils: No tonsillar exudate or tonsillar abscesses.     Comments: Postnasal drainage overlying pharynx. Eyes:     General: No scleral icterus.       Right eye: No discharge.        Left eye: No discharge.     Extraocular Movements: Extraocular movements intact.     Right eye: Normal extraocular motion.     Left eye: Normal extraocular motion.     Conjunctiva/sclera: Conjunctivae normal.  Cardiovascular:     Rate and Rhythm: Normal rate and regular rhythm.     Heart sounds: Normal heart sounds. No murmur heard.    No friction rub. No gallop.  Pulmonary:     Effort: Pulmonary effort is normal. No respiratory distress.     Breath sounds: No stridor. No wheezing, rhonchi or rales.  Chest:     Chest wall: No tenderness.  Musculoskeletal:     Cervical back: Normal range of motion and neck supple.  Lymphadenopathy:     Cervical: No cervical adenopathy.  Skin:    General: Skin is warm and dry.  Neurological:     General: No focal deficit present.     Mental Status: She is alert and oriented to person, place, and time.  Psychiatric:        Mood and Affect: Mood normal.        Behavior: Behavior normal.      Assessment and Plan :   PDMP not reviewed this encounter.  1. Viral URI with cough   2. Shortness of breath   3. Mild persistent asthma without complication     Given lack of high fever, body pains, deferred influenza test. Deferred imaging given clear cardiopulmonary exam, hemodynamically stable vital signs. Does not meet [facility] criteria for strep testing.  In the  context of her asthma, recommended oral prednisone course especially with her respiratory symptoms.  Refilled her albuterol inhaler.  Suspect viral URI, viral syndrome. Physical exam findings reassuring and vital signs stable for discharge. Advised supportive care, offered symptomatic relief. Counseled patient on potential for adverse effects with medications prescribed/recommended today, ER and return-to-clinic precautions discussed, patient verbalized understanding.     Jaynee Eagles, [place] 08/06/22 1616

## 2022-08-06 NOTE — [place] Notes (Signed)
Because of severe worsening of symptoms including SOB not well-alleviated with rescue inhaler and mention of chest pain, you need a detailed examination and possible imaging to rule out concern for a pneumonia. As such, I feel your condition warrants further evaluation and I recommend that you be seen in a face to face visit.   NOTE: There will be NO CHARGE for this eVisit   If you are having a true medical emergency please call 911.      For an urgent face to face visit, [facility] has eight urgent care centers for your convenience:   NEW!! [facility] [place] [facility] [place] Get Driving Directions [id number] [address] [place], [place] [facility] [place] [facility] [place] Get Driving Directions [id number] [place], [place] [address]   [place] [facility]) Get Driving Directions [id number] [address], [place] [address]  [facility] [place] ([place]) Get Driving Directions [id number] [address] [place],  [place]  [address]  [place] [facility] - at [place] Get Driving Directions  [id number] [phone] [place],  [place] [address]   [facility] Get Driving Directions [id number] [address], [place], [place] [address]   [place] Get Driving Directions  [id number] [address] [place], [place] [address]   [facility] [place] Get Driving Directions [id number] [address], [place], [place] [address]  Your MyChart E-visit questionnaire answers were reviewed by a board certified advanced clinical practitioner to complete your personal care plan based on your specific symptoms.  Thank you for using e-Visits.

## 2022-08-06 NOTE — ED Triage Notes (Signed)
Patient c/o productive cough, chest tightness, SOB, postnasal drainage, nasal drainage x 2 days.  Patient did an at home COVID test which was negative.  Patient has taken Alka Seltzer, Cepacol cough drops, Tylenol, Albuterol Inhaler and Robitussin.

## 2022-08-06 NOTE — Discharge Instructions (Signed)
We will manage this as a viral syndrome with supportive care. For sore throat or cough try using a honey-based tea. Use 3 teaspoons of honey with juice squeezed from half lemon. Place shaved pieces of ginger into 1/2-1 cup of water and warm over stove top. Then mix the ingredients and repeat every 4 hours as needed. Please take Tylenol '500mg'$ -'650mg'$  once every 6 hours for fevers, aches and pains. Hydrate very well with at least 2 liters (64 ounces) of water. Eat light meals such as soups (chicken and noodles, chicken wild rice, vegetable).  Do not eat any foods that you are allergic to. Keep taking Zyrtec for postnasal drainage, sinus congestion.  You can take this together with prednisone and albuterol. Use the cough syrup as needed but not the Bromphed.

## 2022-08-09 ENCOUNTER — Ambulatory Visit: Payer: 59 | Admitting: Rehabilitative and Restorative Service Providers"

## 2022-08-17 ENCOUNTER — Ambulatory Visit: Payer: 59 | Admitting: Physical Therapy

## 2022-08-31 ENCOUNTER — Ambulatory Visit: Payer: 59 | Attending: Sports Medicine | Admitting: Physical Therapy

## 2022-08-31 ENCOUNTER — Encounter: Payer: Self-pay | Admitting: Medical-Surgical

## 2022-08-31 ENCOUNTER — Other Ambulatory Visit: Payer: Self-pay

## 2022-08-31 ENCOUNTER — Encounter: Payer: Self-pay | Admitting: Physical Therapy

## 2022-08-31 ENCOUNTER — Ambulatory Visit: Payer: 59 | Admitting: Physical Therapy

## 2022-08-31 DIAGNOSIS — M25512 Pain in left shoulder: Secondary | ICD-10-CM | POA: Diagnosis not present

## 2022-08-31 DIAGNOSIS — R252 Cramp and spasm: Secondary | ICD-10-CM

## 2022-08-31 DIAGNOSIS — M542 Cervicalgia: Secondary | ICD-10-CM | POA: Insufficient documentation

## 2022-08-31 DIAGNOSIS — M5412 Radiculopathy, cervical region: Secondary | ICD-10-CM | POA: Insufficient documentation

## 2022-08-31 NOTE — Telephone Encounter (Signed)
If she is planning to continue the pregnancy she can schedule with any OB provider who accepts her insurance. If she plans to terminate we can send her information for offices that offer that service.

## 2022-08-31 NOTE — Therapy (Signed)
OUTPATIENT PHYSICAL THERAPY CERVICAL EVALUATION   Patient Name: Erica Boyer MRN: [id number] DOB:Jul 05, 1985, 37 y.o., female Today's Date: 08/31/2022  END OF SESSION:  PT End of Session - 08/31/22 1145     Visit Number 1    Date for PT Re-Evaluation 10/12/22    Authorization Type UMR    PT Start Time 1145    PT Stop Time 1228    PT Time Calculation (min) 43 min    Activity Tolerance Patient tolerated treatment well    Behavior During Therapy WFL for tasks assessed/performed             Past Medical History:  Diagnosis Date   Asthma    Fructose intolerance    GERD (gastroesophageal reflux disease)    Lactose intolerance    Past Surgical History:  Procedure Laterality Date   NO PAST SURGERIES     Patient Active Problem List   Diagnosis Date Noted   Left cervical radiculopathy 07/22/2022   Seasonal allergies 11/13/2021   Gastroesophageal reflux disease 11/13/2021   History of vitamin D deficiency 11/13/2021   Patellofemoral syndrome, left 03/09/2021   Left ankle sprain 03/09/2021   Anxiety with depression 02/02/2021   Migraines 03/29/2006   Asthma 11/27/2003    PCP: Samuel Bouche, NP   REFERRING PROVIDER: Silverio Decamp,*   REFERRING DIAG: [id number] (ICD-10-CM) - Left cervical radiculopathy   THERAPY DIAG:  Cervicalgia  Acute pain of left shoulder  Cramp and spasm  Rationale for Evaluation and Treatment: Rehabilitation  ONSET DATE: February 2024  SUBJECTIVE:                                                                                                                                                                                                         SUBJECTIVE STATEMENT: Helping a co-worker move a patient and the patient didn't move. Felt a pop in her left shoulder. When she got home she had numbness/tingling all the way down her arm. Then her neck and area into shoulder blade started hurting. She's had some relief with gabapentin. Got  norovirus at end of Feb so has stopped taking it.  She now gets N/T only intermittently like when lying on it.   PERTINENT HISTORY:  asthma  PAIN:  Are you having pain? Yes: NPRS scale: 4/10 Pain location: ant L shoulder, into UT and  scapular  Pain description: throbbing, constant Aggravating factors: lifting, carrying purse/bag on L shoulder, lying on it, ABD Relieving factors: ice  PRECAUTIONS: None  WEIGHT BEARING RESTRICTIONS: No  FALLS:  Has patient  fallen in last 6 months? No  LIVING ENVIRONMENT: Lives with: lives with their family Lives in: House/apartment Stairs:  N/A Has following equipment at home: None  OCCUPATION: Nurse at [employer], on light duty (Camera operator)  PLOF: Independent  PATIENT GOALS: get back to my normal activities at work and at home  NEXT MD VISIT: 09/14/22  OBJECTIVE:   DIAGNOSTIC FINDINGS:  Negative Cspine xray 07/23/22  PATIENT SURVEYS:  FOTO 59 (predicted 55)  COGNITION: Overall cognitive status: Within functional limits for tasks assessed  SENSATION: WFL  POSTURE: rounded shoulders, forward head, and increased thoracic kyphosis  PALPATION: Palpation: TTP at ant L shoulder, suprapinatus, UT, LS, medial scapular border, lats, pects. Increased tissue tension in left UT, rhomboids, pects Spinal Mobility: pain and decreased thoracic mobility, pain with CPA and UPA mobs bil    CERVICAL ROM:   Active ROM A/PROM (deg) eval  Flexion full  Extension full  Right lateral flexion full  Left lateral flexion full  Right rotation 80%  Left rotation 70%*   (Blank rows = not tested)  UPPER EXTREMITY ROM:   ROM Right eval Left eval  Shoulder flexion  160/166  Shoulder extension  full  Shoulder abduction  147/180  Shoulder internal rotation  Full functional = to R  Shoulder external rotation  Full functional but tight behind head   (Blank rows = not tested) ABD - painful at 135 on; flex had pop at 160 with passive  UPPER  EXTREMITY MMT:  MMT Right eval Left eval  Shoulder flexion  5  Shoulder extension  5  Shoulder abduction  4+  Shoulder adduction    Shoulder internal rotation  5  Shoulder external rotation  4+  Middle trapezius    Lower trapezius     (Blank rows = not tested)  CERVICAL SPECIAL TESTS:  Upper limb tension test (ULTT): Positive, Spurling's test: Negative, and Distraction test: Negative median and ulnar, neg radial  SHOULDER SPECIAL TESTS: + Impingement  TODAY'S TREATMENT:                                                                                                                              DATE:   08/31/22 See pt ed  PATIENT EDUCATION:  Education details: PT eval findings, anticipated POC, initial HEP, postural awareness, and role of DN  Person educated: Patient Education method: Explanation, Demonstration, Verbal cues, and Handouts Education comprehension: verbalized understanding and returned demonstration  HOME EXERCISE PROGRAM: Access Code: [email or web address] URL: [email or web address] Date: 08/31/2022 Prepared by: Almyra Free  Exercises - Shoulder External Rotation and Scapular Retraction with Resistance  - 1 x daily - 3 x weekly - 3 sets - 10 reps - Standing Shoulder Row with Anchored Resistance  - 1 x daily - 3 x weekly - 3 sets - 10 reps - Shoulder extension with resistance - Neutral  - 1 x daily - 3 x weekly - 3 sets - 10 reps - Seated Cervical Retraction  -  2 x daily - 7 x weekly - 1 sets - 10 reps - 5 sec hold - Seated Levator Scapulae Stretch  - 2 x daily - 7 x weekly - 1 sets - 3 reps - 30 sec hold - Seated Cervical Sidebending Stretch (Mirrored)  - 2 x daily - 7 x weekly - 1 sets - 3 reps - 30-60 sec hold  ASSESSMENT:  CLINICAL IMPRESSION: Erica Boyer is a 37 y.o. female who was seen today for physical therapy evaluation and treatment for left neck and shoulder pain after hurting them moving a patient. She has tightness in the left upper quadrant  affecting left shoulder flex and ABD as well as left neck rotation. She has negative neck special tests, but some ulnar and medial nerve tension on the left with intermittent N/T when she lies on her left side. She also has significant postural deficits and mild shoulder weakness. Deficits are affecting her ability to work (she is on light duty) and perform ADLs including lifting and driving. She will benefit from skilled PT to address these deficits.    OBJECTIVE IMPAIRMENTS: decreased ROM, decreased strength, increased muscle spasms, impaired flexibility, impaired UE functional use, postural dysfunction, obesity, and pain.   ACTIVITY LIMITATIONS: lifting, sleeping, reach over head, and caring for others  PARTICIPATION LIMITATIONS: driving and occupation  PERSONAL FACTORS: Fitness and 1 comorbidity: asthma  are also affecting patient's functional outcome.   REHAB POTENTIAL: Excellent  CLINICAL DECISION MAKING: Stable/uncomplicated  EVALUATION COMPLEXITY: Low   GOALS: Goals reviewed with patient? Yes  SHORT TERM GOALS: Target date: 09/14/2022   Patient will be independent with initial HEP.  Baseline:  Goal status: INITIAL  2.  Patient to report negative N/T in L UE and neg nerve tension.  Baseline:  Goal status: INITIAL  LONG TERM GOALS: Target date: 10/12/2022   Patient will be independent with advanced/ongoing HEP to improve outcomes and carryover.  Baseline:  Goal status: INITIAL  2.  Patient will report 90% improvement in neck and left shoulder pain to improve QOL.  Baseline:  Goal status: INITIAL  3.  Patient will demonstrate full pain free cervical and L shoulder ROM for safety with driving.  Baseline:  Goal status: INITIAL  4.  Patient will demonstrate improved posture to decrease muscle imbalance. Baseline:  Goal status: INITIAL  5.  Patient will report 30 on FOTO to demonstrate improved functional ability.  Baseline: 59 Goal status: INITIAL  6.  Patient will  be able to perform regular job duties without increased pain in her neck and shoulder.   Baseline:  Goal status: INITIAL     PLAN:  PT FREQUENCY: 2x/week  PT DURATION: 6 weeks  PLANNED INTERVENTIONS: Therapeutic exercises, Therapeutic activity, Neuromuscular re-education, Patient/Family education, Self Care, Joint mobilization, Dry Needling, Electrical stimulation, Spinal mobilization, Cryotherapy, Moist heat, Taping, Traction, Ultrasound, Ionotophoresis 4mg /ml Dexamethasone, and Manual therapy  PLAN FOR NEXT SESSION: DN/MT to left upper quadrant (neck and parascapular mm, pects), review HEP and progress with upper back strengthening, chest opening, thoracic extension, progress to work specific strengthening   Katye Valek, PT 08/31/2022, 12:48 PM

## 2022-08-31 NOTE — Telephone Encounter (Signed)
She was prescribed Annovera in November, did she become pregnant while using this (will need to report to the company if so)? If not and she is planning to continue the pregnancy she can schedule with any OB provider who accepts her insurance. If she plans to terminate we can send her information for offices that offer that service.

## 2022-09-01 ENCOUNTER — Other Ambulatory Visit: Payer: Self-pay | Admitting: Medical-Surgical

## 2022-09-02 ENCOUNTER — Ambulatory Visit: Payer: 59 | Admitting: Sports Medicine

## 2022-09-03 ENCOUNTER — Ambulatory Visit: Payer: 59

## 2022-09-03 DIAGNOSIS — M5412 Radiculopathy, cervical region: Secondary | ICD-10-CM | POA: Diagnosis not present

## 2022-09-03 DIAGNOSIS — M25512 Pain in left shoulder: Secondary | ICD-10-CM | POA: Diagnosis not present

## 2022-09-03 DIAGNOSIS — R252 Cramp and spasm: Secondary | ICD-10-CM

## 2022-09-03 DIAGNOSIS — M542 Cervicalgia: Secondary | ICD-10-CM | POA: Diagnosis not present

## 2022-09-03 NOTE — Therapy (Signed)
OUTPATIENT PHYSICAL THERAPY CERVICAL TREATMENT   Patient Name: Erica Boyer MRN: [id number] DOB:Feb 26, 1986, 37 y.o., female Today's Date: 09/03/2022  END OF SESSION:  PT End of Session - 09/03/22 0802     Visit Number 2    Date for PT Re-Evaluation 10/12/22    Authorization Type UMR    PT Start Time 0803    PT Stop Time 0845    PT Time Calculation (min) 42 min    Activity Tolerance Patient tolerated treatment well    Behavior During Therapy [facility] for tasks assessed/performed             Past Medical History:  Diagnosis Date   Asthma    Fructose intolerance    GERD (gastroesophageal reflux disease)    Lactose intolerance    Past Surgical History:  Procedure Laterality Date   NO PAST SURGERIES     Patient Active Problem List   Diagnosis Date Noted   Left cervical radiculopathy 07/22/2022   Seasonal allergies 11/13/2021   Gastroesophageal reflux disease 11/13/2021   History of vitamin D deficiency 11/13/2021   Patellofemoral syndrome, left 03/09/2021   Left ankle sprain 03/09/2021   Anxiety with depression 02/02/2021   Migraines 03/29/2006   Asthma 11/27/2003    PCP: Samuel Bouche, NP   REFERRING PROVIDER: Silverio Decamp,*   REFERRING DIAG: [id number] (ICD-10-CM) - Left cervical radiculopathy   THERAPY DIAG:  Cervicalgia  Acute pain of left shoulder  Cramp and spasm  Rationale for Evaluation and Treatment: Rehabilitation  ONSET DATE: February 2024  SUBJECTIVE:                                                                                                                                                                                                         SUBJECTIVE STATEMENT: Patient reports the tingling is worse in when sleeping and using her L arm a lot. Patient states her neck always feels stiff and has a constant pain 4/10, states it bothers her when driving and needing to turn her head to the left. Patient is compliant with HEP, states  she had less back and shoulder pain at the end of the day.   PERTINENT HISTORY:  asthma  PAIN:  Are you having pain? Yes: NPRS scale: 4/10 Pain location: ant L shoulder, into UT and  scapular  Pain description: throbbing, constant Aggravating factors: lifting, carrying purse/bag on L shoulder, lying on it, ABD Relieving factors: ice  PRECAUTIONS: None  WEIGHT BEARING RESTRICTIONS: No  FALLS:  Has patient fallen in last 6 months? No  LIVING ENVIRONMENT: Lives with: lives with their family Lives in: House/apartment Stairs:  N/A Has following equipment at home: None  OCCUPATION: Nurse at [employer], on light duty (Camera operator)  PLOF: Independent  PATIENT GOALS: get back to my normal activities at work and at home  NEXT MD VISIT: 09/14/22  OBJECTIVE:   DIAGNOSTIC FINDINGS:  Negative Cspine xray 07/23/22  PATIENT SURVEYS:  FOTO 59 (predicted 63)  COGNITION: Overall cognitive status: Within functional limits for tasks assessed  SENSATION: WFL  POSTURE: rounded shoulders, forward head, and increased thoracic kyphosis  PALPATION: Palpation: TTP at ant L shoulder, suprapinatus, UT, LS, medial scapular border, lats, pects. Increased tissue tension in left UT, rhomboids, pects Spinal Mobility: pain and decreased thoracic mobility, pain with CPA and UPA mobs bil    CERVICAL ROM:   Active ROM A/PROM (deg) eval  Flexion full  Extension full  Right lateral flexion full  Left lateral flexion full  Right rotation 80%  Left rotation 70%*   (Blank rows = not tested)  UPPER EXTREMITY ROM:   ROM Right eval Left eval  Shoulder flexion  160/166  Shoulder extension  full  Shoulder abduction  147/180  Shoulder internal rotation  Full functional = to R  Shoulder external rotation  Full functional but tight behind head   (Blank rows = not tested) ABD - painful at 135 on; flex had pop at 160 with passive  UPPER EXTREMITY MMT:  MMT Right eval Left eval  Shoulder  flexion  5  Shoulder extension  5  Shoulder abduction  4+  Shoulder adduction    Shoulder internal rotation  5  Shoulder external rotation  4+  Middle trapezius    Lower trapezius     (Blank rows = not tested)  CERVICAL SPECIAL TESTS:  Upper limb tension test (ULTT): Positive, Spurling's test: Negative, and Distraction test: Negative median and ulnar, neg radial  SHOULDER SPECIAL TESTS: + Impingement  TODAY'S TREATMENT:     [facility] Adult PT Treatment:                                                DATE: 09/03/2022 Therapeutic Exercise: Median & radial nerve glides Supine: Passive shoulder ROM AAROM shoulder flexion 1#dowel x10  AAROM shoulder abd w/dowel x8 Seated: AAROM shoulder ER w/dowel x8 No monies YTB 2x10 (L) UT stretch (hand behind back) x 30" --> LS stretch x 30" Cervical rotation SNAGs Standing:  Rows GTB x10 Shoulder extension GTB x10 Manual Therapy: STM cervical paraspinals, anterior scalenes, SCM, cervical upglides, SO/SOR Shoulder IR MWM (discontinued d/t high musc                                                                                                                             DATE:   08/31/22 See pt ed  PATIENT  EDUCATION:  Education details: PT eval findings, anticipated POC, initial HEP, postural awareness, and role of DN  Person educated: Patient Education method: Explanation, Demonstration, Verbal cues, and Handouts Education comprehension: verbalized understanding and returned demonstration  HOME EXERCISE PROGRAM: Access Code: [email or web address] URL: [email or web address] Date: 08/31/2022 Prepared by: Almyra Free  Exercises - Shoulder External Rotation and Scapular Retraction with Resistance  - 1 x daily - 3 x weekly - 3 sets - 10 reps - Standing Shoulder Row with Anchored Resistance  - 1 x daily - 3 x weekly - 3 sets - 10 reps - Shoulder extension with resistance - Neutral  - 1 x daily - 3 x weekly - 3 sets - 10 reps - Seated Cervical  Retraction  - 2 x daily - 7 x weekly - 1 sets - 10 reps - 5 sec hold - Seated Levator Scapulae Stretch  - 2 x daily - 7 x weekly - 1 sets - 3 reps - 30 sec hold - Seated Cervical Sidebending Stretch (Mirrored)  - 2 x daily - 7 x weekly - 1 sets - 3 reps - 30-60 sec hold  ASSESSMENT:  CLINICAL IMPRESSION: Patient exhibited significant muscle guarding during passive shoulder ROM; verbal and tactile cues provided minimal improvement in relaxation. Cervical SNAG stretch performed to improve rotational mobility and decrease tightness/tension. Tactile cues provided to decrease upper trapezius compensation and improve scapular retraction/depression with postural strengthening exercises.   OBJECTIVE IMPAIRMENTS: decreased ROM, decreased strength, increased muscle spasms, impaired flexibility, impaired UE functional use, postural dysfunction, obesity, and pain.   ACTIVITY LIMITATIONS: lifting, sleeping, reach over head, and caring for others  PARTICIPATION LIMITATIONS: driving and occupation  PERSONAL FACTORS: Fitness and 1 comorbidity: asthma  are also affecting patient's functional outcome.   REHAB POTENTIAL: Excellent  CLINICAL DECISION MAKING: Stable/uncomplicated  EVALUATION COMPLEXITY: Low   GOALS: Goals reviewed with patient? Yes  SHORT TERM GOALS: Target date: 09/14/2022   Patient will be independent with initial HEP.  Baseline:  Goal status: INITIAL  2.  Patient to report negative N/T in L UE and neg nerve tension.  Baseline:  Goal status: INITIAL  LONG TERM GOALS: Target date: 10/12/2022   Patient will be independent with advanced/ongoing HEP to improve outcomes and carryover.  Baseline:  Goal status: INITIAL  2.  Patient will report 90% improvement in neck and left shoulder pain to improve QOL.  Baseline:  Goal status: INITIAL  3.  Patient will demonstrate full pain free cervical and L shoulder ROM for safety with driving.  Baseline:  Goal status: INITIAL  4.   Patient will demonstrate improved posture to decrease muscle imbalance. Baseline:  Goal status: INITIAL  5.  Patient will report 25 on FOTO to demonstrate improved functional ability.  Baseline: 59 Goal status: INITIAL  6.  Patient will be able to perform regular job duties without increased pain in her neck and shoulder.   Baseline:  Goal status: INITIAL     PLAN:  PT FREQUENCY: 2x/week  PT DURATION: 6 weeks  PLANNED INTERVENTIONS: Therapeutic exercises, Therapeutic activity, Neuromuscular re-education, Patient/Family education, Self Care, Joint mobilization, Dry Needling, Electrical stimulation, Spinal mobilization, Cryotherapy, Moist heat, Taping, Traction, Ultrasound, Ionotophoresis 4mg /ml Dexamethasone, and Manual therapy  PLAN FOR NEXT SESSION: DN/MT to left upper quadrant (neck and parascapular mm, pects), progress upper back strengthening, chest opening, thoracic extension, progress to work specific strengthening   Hardin Negus, PTA 09/03/2022, 8:48 AM

## 2022-09-06 ENCOUNTER — Ambulatory Visit: Payer: 59 | Admitting: Physical Therapy

## 2022-09-06 ENCOUNTER — Encounter: Payer: Self-pay | Admitting: Physical Therapy

## 2022-09-06 DIAGNOSIS — R252 Cramp and spasm: Secondary | ICD-10-CM | POA: Diagnosis not present

## 2022-09-06 DIAGNOSIS — M5412 Radiculopathy, cervical region: Secondary | ICD-10-CM | POA: Diagnosis not present

## 2022-09-06 DIAGNOSIS — M542 Cervicalgia: Secondary | ICD-10-CM

## 2022-09-06 DIAGNOSIS — M25512 Pain in left shoulder: Secondary | ICD-10-CM | POA: Diagnosis not present

## 2022-09-06 NOTE — Therapy (Signed)
OUTPATIENT PHYSICAL THERAPY CERVICAL TREATMENT   Patient Name: Erica Boyer MRN: [id number] DOB:01/26/1986, 37 y.o., female Today's Date: 09/06/2022  END OF SESSION:  PT End of Session - 09/06/22 1018     Visit Number 3    Date for PT Re-Evaluation 10/12/22    Authorization Type UMR    PT Start Time 1016    PT Stop Time 1058    PT Time Calculation (min) 42 min    Activity Tolerance Patient tolerated treatment well    Behavior During Therapy WFL for tasks assessed/performed              Past Medical History:  Diagnosis Date   Asthma    Fructose intolerance    GERD (gastroesophageal reflux disease)    Lactose intolerance    Past Surgical History:  Procedure Laterality Date   NO PAST SURGERIES     Patient Active Problem List   Diagnosis Date Noted   Left cervical radiculopathy 07/22/2022   Seasonal allergies 11/13/2021   Gastroesophageal reflux disease 11/13/2021   History of vitamin D deficiency 11/13/2021   Patellofemoral syndrome, left 03/09/2021   Left ankle sprain 03/09/2021   Anxiety with depression 02/02/2021   Migraines 03/29/2006   Asthma 11/27/2003    PCP: Samuel Bouche, NP   REFERRING PROVIDER: Silverio Decamp,*   REFERRING DIAG: [id number] (ICD-10-CM) - Left cervical radiculopathy   THERAPY DIAG:  Cervicalgia  Acute pain of left shoulder  Cramp and spasm  Rationale for Evaluation and Treatment: Rehabilitation  ONSET DATE: February 2024  SUBJECTIVE:                                                                                                                                                                                                         SUBJECTIVE STATEMENT:  I found out I'm pregnant, neck and shoulder are feeling better. I've been doing stretching exercises at work. I feel like the stretches with the bands and working on opening up my chest have been helping.   PERTINENT HISTORY:  asthma  PAIN:  Are you having pain?  Yes: NPRS scale: 2/10 Pain location: deep in L shoulder, up into shoulder and neck Pain description: dull, nagging ache  Aggravating factors: anything that pulls on L UE (like holding a water bottle or pulling a wagon) Relieving factors: opening up chest, ice   PRECAUTIONS: None  WEIGHT BEARING RESTRICTIONS: No  FALLS:  Has patient fallen in last 6 months? No  LIVING ENVIRONMENT: Lives with: lives with their family Lives in: House/apartment Stairs:  N/A  Has following equipment at home: None  OCCUPATION: Nurse at [employer], on light duty Hydrographic surveyor)  PLOF: Independent  PATIENT GOALS: get back to my normal activities at work and at home  NEXT MD VISIT: 09/14/22  OBJECTIVE:   DIAGNOSTIC FINDINGS:  Negative Cspine xray 07/23/22  PATIENT SURVEYS:  FOTO 59 (predicted 72)  COGNITION: Overall cognitive status: Within functional limits for tasks assessed  SENSATION: WFL  POSTURE: rounded shoulders, forward head, and increased thoracic kyphosis  PALPATION: Palpation: TTP at ant L shoulder, suprapinatus, UT, LS, medial scapular border, lats, pects. Increased tissue tension in left UT, rhomboids, pects Spinal Mobility: pain and decreased thoracic mobility, pain with CPA and UPA mobs bil    CERVICAL ROM:   Active ROM A/PROM (deg) eval  Flexion full  Extension full  Right lateral flexion full  Left lateral flexion full  Right rotation 80%  Left rotation 70%*   (Blank rows = not tested)  UPPER EXTREMITY ROM:   ROM Right eval Left eval  Shoulder flexion  160/166  Shoulder extension  full  Shoulder abduction  147/180  Shoulder internal rotation  Full functional = to R  Shoulder external rotation  Full functional but tight behind head   (Blank rows = not tested) ABD - painful at 135 on; flex had pop at 160 with passive  UPPER EXTREMITY MMT:  MMT Right eval Left eval  Shoulder flexion  5  Shoulder extension  5  Shoulder abduction  4+  Shoulder adduction     Shoulder internal rotation  5  Shoulder external rotation  4+  Middle trapezius    Lower trapezius     (Blank rows = not tested)  CERVICAL SPECIAL TESTS:  Upper limb tension test (ULTT): Positive, Spurling's test: Negative, and Distraction test: Negative median and ulnar, neg radial  SHOULDER SPECIAL TESTS: + Impingement  TODAY'S TREATMENT:     [facility] Adult PT Treatment:                                                DATE:   09/06/22  TherEx  L shoulder PROM/stretches in supine Doorway pillowcase flexion stretch 10x5 second holds Doorway pillowcase ABD stretch 10x5 second holds  Seated scap retraction + ER red TB x10   Manual  STM L upper trap in supine + L middle delt in supine  Attempted MFR but mm tissue too irritable/increased pain  09/03/2022 Therapeutic Exercise: Median & radial nerve glides Supine: Passive shoulder ROM AAROM shoulder flexion 1#dowel x10  AAROM shoulder abd w/dowel x8 Seated: AAROM shoulder ER w/dowel x8 No monies YTB 2x10 (L) UT stretch (hand behind back) x 30" --> LS stretch x 30" Cervical rotation SNAGs Standing:  Rows GTB x10 Shoulder extension GTB x10 Manual Therapy: STM cervical paraspinals, anterior scalenes, SCM, cervical upglides, SO/SOR Shoulder IR MWM (discontinued d/t high musc  DATE:   08/31/22 See pt ed  PATIENT EDUCATION:  Education details: PT eval findings, anticipated POC, initial HEP, postural awareness, and role of DN  Person educated: Patient Education method: Explanation, Demonstration, Verbal cues, and Handouts Education comprehension: verbalized understanding and returned demonstration  HOME EXERCISE PROGRAM: Access Code: [email or web address] URL: [email or web address] Date: 08/31/2022 Prepared by: Almyra Free  Exercises - Shoulder External Rotation and Scapular Retraction with Resistance   - 1 x daily - 3 x weekly - 3 sets - 10 reps - Standing Shoulder Row with Anchored Resistance  - 1 x daily - 3 x weekly - 3 sets - 10 reps - Shoulder extension with resistance - Neutral  - 1 x daily - 3 x weekly - 3 sets - 10 reps - Seated Cervical Retraction  - 2 x daily - 7 x weekly - 1 sets - 10 reps - 5 sec hold - Seated Levator Scapulae Stretch  - 2 x daily - 7 x weekly - 1 sets - 3 reps - 30 sec hold - Seated Cervical Sidebending Stretch (Mirrored)  - 2 x daily - 7 x weekly - 1 sets - 3 reps - 30-60 sec hold  ASSESSMENT:  CLINICAL IMPRESSION:  Ayaat arrives doing OK today, starting to get some relief from PT interventions. Spent time working on PROM and shoulder stretches this morning, also incorporated manual techniques as well in addition to strengthening as time allowed. Will continue to progress as able.    OBJECTIVE IMPAIRMENTS: decreased ROM, decreased strength, increased muscle spasms, impaired flexibility, impaired UE functional use, postural dysfunction, obesity, and pain.   ACTIVITY LIMITATIONS: lifting, sleeping, reach over head, and caring for others  PARTICIPATION LIMITATIONS: driving and occupation  PERSONAL FACTORS: Fitness and 1 comorbidity: asthma  are also affecting patient's functional outcome.   REHAB POTENTIAL: Excellent  CLINICAL DECISION MAKING: Stable/uncomplicated  EVALUATION COMPLEXITY: Low   GOALS: Goals reviewed with patient? Yes  SHORT TERM GOALS: Target date: 09/14/2022   Patient will be independent with initial HEP.  Baseline:  Goal status: INITIAL  2.  Patient to report negative N/T in L UE and neg nerve tension.  Baseline:  Goal status: INITIAL  LONG TERM GOALS: Target date: 10/12/2022   Patient will be independent with advanced/ongoing HEP to improve outcomes and carryover.  Baseline:  Goal status: INITIAL  2.  Patient will report 90% improvement in neck and left shoulder pain to improve QOL.  Baseline:  Goal status:  INITIAL  3.  Patient will demonstrate full pain free cervical and L shoulder ROM for safety with driving.  Baseline:  Goal status: INITIAL  4.  Patient will demonstrate improved posture to decrease muscle imbalance. Baseline:  Goal status: INITIAL  5.  Patient will report 42 on FOTO to demonstrate improved functional ability.  Baseline: 59 Goal status: INITIAL  6.  Patient will be able to perform regular job duties without increased pain in her neck and shoulder.   Baseline:  Goal status: INITIAL     PLAN:  PT FREQUENCY: 2x/week  PT DURATION: 6 weeks  PLANNED INTERVENTIONS: Therapeutic exercises, Therapeutic activity, Neuromuscular re-education, Patient/Family education, Self Care, Joint mobilization, Dry Needling, Electrical stimulation, Spinal mobilization, Cryotherapy, Moist heat, Taping, Traction, Ultrasound, Ionotophoresis 4mg /ml Dexamethasone, and Manual therapy  PLAN FOR NEXT SESSION: DN/MT to left upper quadrant (neck and parascapular mm, pects), progress upper back strengthening, chest opening, thoracic extension, progress to work specific strengthening  Deniece Ree PT DPT PN2

## 2022-09-07 ENCOUNTER — Ambulatory Visit: Payer: 59 | Admitting: Sports Medicine

## 2022-09-09 ENCOUNTER — Ambulatory Visit: Payer: 59 | Admitting: Physical Therapy

## 2022-09-09 ENCOUNTER — Encounter: Payer: Self-pay | Admitting: Physical Therapy

## 2022-09-09 DIAGNOSIS — M25512 Pain in left shoulder: Secondary | ICD-10-CM

## 2022-09-09 DIAGNOSIS — M542 Cervicalgia: Secondary | ICD-10-CM | POA: Diagnosis not present

## 2022-09-09 DIAGNOSIS — R252 Cramp and spasm: Secondary | ICD-10-CM | POA: Diagnosis not present

## 2022-09-09 DIAGNOSIS — M5412 Radiculopathy, cervical region: Secondary | ICD-10-CM | POA: Diagnosis not present

## 2022-09-09 NOTE — Therapy (Signed)
OUTPATIENT PHYSICAL THERAPY CERVICAL TREATMENT   Patient Name: Erica Boyer MRN: [id number] DOB:September 14, 1985, 37 y.o., female Today's Date: 09/09/2022  END OF SESSION:  PT End of Session - 09/09/22 1020     Visit Number 4    Date for PT Re-Evaluation 10/12/22    Authorization Type UMR    PT Start Time 1018    PT Stop Time 1056    PT Time Calculation (min) 38 min    Activity Tolerance Patient tolerated treatment well    Behavior During Therapy WFL for tasks assessed/performed               Past Medical History:  Diagnosis Date   Asthma    Fructose intolerance    GERD (gastroesophageal reflux disease)    Lactose intolerance    Past Surgical History:  Procedure Laterality Date   NO PAST SURGERIES     Patient Active Problem List   Diagnosis Date Noted   Left cervical radiculopathy 07/22/2022   Seasonal allergies 11/13/2021   Gastroesophageal reflux disease 11/13/2021   History of vitamin D deficiency 11/13/2021   Patellofemoral syndrome, left 03/09/2021   Left ankle sprain 03/09/2021   Anxiety with depression 02/02/2021   Migraines 03/29/2006   Asthma 11/27/2003    PCP: Samuel Bouche, NP   REFERRING PROVIDER: Silverio Decamp,*   REFERRING DIAG: [id number] (ICD-10-CM) - Left cervical radiculopathy   THERAPY DIAG:  Cervicalgia  Acute pain of left shoulder  Cramp and spasm  Rationale for Evaluation and Treatment: Rehabilitation  ONSET DATE: February 2024  SUBJECTIVE:                                                                                                                                                                                                         SUBJECTIVE STATEMENT:  Feeling good, not having that throbbing nagging discomfort, it only does it when I'm reaching up overhead. Not much going on otherwise. Not looking forward to hours of homework later, working on BSN.   PERTINENT HISTORY:  asthma  PAIN:  Are you having pain?  Yes: NPRS scale: 2/10 Pain location: back of the L shoulder  Pain description: nagging discomfort "not even ice pack worthy"  Aggravating factors: reaching overhead, trying to open jars/stanley cup/etc  Relieving factors: stretching   PRECAUTIONS: None  WEIGHT BEARING RESTRICTIONS: No  FALLS:  Has patient fallen in last 6 months? No  LIVING ENVIRONMENT: Lives with: lives with their family Lives in: House/apartment Stairs:  N/A Has following equipment at home: None  OCCUPATION: Nurse at [employer],  on light duty Hydrographic surveyor)  PLOF: Independent  PATIENT GOALS: get back to my normal activities at work and at home  NEXT MD VISIT: 09/14/22  OBJECTIVE:   DIAGNOSTIC FINDINGS:  Negative Cspine xray 07/23/22  PATIENT SURVEYS:  FOTO 59 (predicted 23)  COGNITION: Overall cognitive status: Within functional limits for tasks assessed  SENSATION: WFL  POSTURE: rounded shoulders, forward head, and increased thoracic kyphosis  PALPATION: Palpation: TTP at ant L shoulder, suprapinatus, UT, LS, medial scapular border, lats, pects. Increased tissue tension in left UT, rhomboids, pects Spinal Mobility: pain and decreased thoracic mobility, pain with CPA and UPA mobs bil    CERVICAL ROM:   Active ROM A/PROM (deg) eval  Flexion full  Extension full  Right lateral flexion full  Left lateral flexion full  Right rotation 80%  Left rotation 70%*   (Blank rows = not tested)  UPPER EXTREMITY ROM:   ROM Right eval Left eval  Shoulder flexion  160/166  Shoulder extension  full  Shoulder abduction  147/180  Shoulder internal rotation  Full functional = to R  Shoulder external rotation  Full functional but tight behind head   (Blank rows = not tested) ABD - painful at 135 on; flex had pop at 160 with passive  UPPER EXTREMITY MMT:  MMT Right eval Left eval  Shoulder flexion  5  Shoulder extension  5  Shoulder abduction  4+  Shoulder adduction    Shoulder internal  rotation  5  Shoulder external rotation  4+  Middle trapezius    Lower trapezius     (Blank rows = not tested)  CERVICAL SPECIAL TESTS:  Upper limb tension test (ULTT): Positive, Spurling's test: Negative, and Distraction test: Negative median and ulnar, neg radial  SHOULDER SPECIAL TESTS: + Impingement  TODAY'S TREATMENT:     [facility] Adult PT Treatment:                                                DATE:   09/09/22  TherEx  L shoulder PROM/stretching all directions supine Supine shoulder flexion 2# 2x10 Sidelying shoulder ABD 2# 2x10  Supine serratus punches 2# 2x15  Scapular retractions red TB 2x10 with 3 second holds  Shoulder extensions red TB 2x10    09/06/22  TherEx  L shoulder PROM/stretches in supine Doorway pillowcase flexion stretch 10x5 second holds Doorway pillowcase ABD stretch 10x5 second holds  Seated scap retraction + ER red TB x10   Manual  STM L upper trap in supine + L middle delt in supine  Attempted MFR but mm tissue too irritable/increased pain  09/03/2022 Therapeutic Exercise: Median & radial nerve glides Supine: Passive shoulder ROM AAROM shoulder flexion 1#dowel x10  AAROM shoulder abd w/dowel x8 Seated: AAROM shoulder ER w/dowel x8 No monies YTB 2x10 (L) UT stretch (hand behind back) x 30" --> LS stretch x 30" Cervical rotation SNAGs Standing:  Rows GTB x10 Shoulder extension GTB x10 Manual Therapy: STM cervical paraspinals, anterior scalenes, SCM, cervical upglides, SO/SOR Shoulder IR MWM (discontinued d/t high musc  DATE:   08/31/22 See pt ed  PATIENT EDUCATION:  Education details: PT eval findings, anticipated POC, initial HEP, postural awareness, and role of DN  Person educated: Patient Education method: Explanation, Demonstration, Verbal cues, and Handouts Education comprehension: verbalized  understanding and returned demonstration  HOME EXERCISE PROGRAM: Access Code: [email or web address] URL: [email or web address] Date: 08/31/2022 Prepared by: Almyra Free  Exercises - Shoulder External Rotation and Scapular Retraction with Resistance  - 1 x daily - 3 x weekly - 3 sets - 10 reps - Standing Shoulder Row with Anchored Resistance  - 1 x daily - 3 x weekly - 3 sets - 10 reps - Shoulder extension with resistance - Neutral  - 1 x daily - 3 x weekly - 3 sets - 10 reps - Seated Cervical Retraction  - 2 x daily - 7 x weekly - 1 sets - 10 reps - 5 sec hold - Seated Levator Scapulae Stretch  - 2 x daily - 7 x weekly - 1 sets - 3 reps - 30 sec hold - Seated Cervical Sidebending Stretch (Mirrored)  - 2 x daily - 7 x weekly - 1 sets - 3 reps - 30-60 sec hold  ASSESSMENT:  CLINICAL IMPRESSION:  Tarshia arrives today doing well, really only having increase in pain when she tries to lift anything overhead. Continued working on ROM today followed by introduction to strength training as tolerated today. Did well overall, will continue to progress as able.    OBJECTIVE IMPAIRMENTS: decreased ROM, decreased strength, increased muscle spasms, impaired flexibility, impaired UE functional use, postural dysfunction, obesity, and pain.   ACTIVITY LIMITATIONS: lifting, sleeping, reach over head, and caring for others  PARTICIPATION LIMITATIONS: driving and occupation  PERSONAL FACTORS: Fitness and 1 comorbidity: asthma  are also affecting patient's functional outcome.   REHAB POTENTIAL: Excellent  CLINICAL DECISION MAKING: Stable/uncomplicated  EVALUATION COMPLEXITY: Low   GOALS: Goals reviewed with patient? Yes  SHORT TERM GOALS: Target date: 09/14/2022   Patient will be independent with initial HEP.  Baseline:  Goal status: INITIAL  2.  Patient to report negative N/T in L UE and neg nerve tension.  Baseline:  Goal status: INITIAL  LONG TERM GOALS: Target date: 10/12/2022   Patient  will be independent with advanced/ongoing HEP to improve outcomes and carryover.  Baseline:  Goal status: INITIAL  2.  Patient will report 90% improvement in neck and left shoulder pain to improve QOL.  Baseline:  Goal status: INITIAL  3.  Patient will demonstrate full pain free cervical and L shoulder ROM for safety with driving.  Baseline:  Goal status: INITIAL  4.  Patient will demonstrate improved posture to decrease muscle imbalance. Baseline:  Goal status: INITIAL  5.  Patient will report 78 on FOTO to demonstrate improved functional ability.  Baseline: 59 Goal status: INITIAL  6.  Patient will be able to perform regular job duties without increased pain in her neck and shoulder.   Baseline:  Goal status: INITIAL     PLAN:  PT FREQUENCY: 2x/week  PT DURATION: 6 weeks  PLANNED INTERVENTIONS: Therapeutic exercises, Therapeutic activity, Neuromuscular re-education, Patient/Family education, Self Care, Joint mobilization, Dry Needling, Electrical stimulation, Spinal mobilization, Cryotherapy, Moist heat, Taping, Traction, Ultrasound, Ionotophoresis 4mg /ml Dexamethasone, and Manual therapy  PLAN FOR NEXT SESSION: DN/MT to left upper quadrant (neck and parascapular mm, pects), progress upper back strengthening, chest opening, thoracic extension, progress to work specific strengthening. How did she feel after progressions last session?   Deniece Ree PT DPT PN2

## 2022-09-13 ENCOUNTER — Ambulatory Visit: Payer: 59 | Admitting: Sports Medicine

## 2022-09-13 ENCOUNTER — Encounter: Payer: Self-pay | Admitting: Sports Medicine

## 2022-09-13 ENCOUNTER — Ambulatory Visit: Payer: 59 | Attending: Sports Medicine | Admitting: Rehabilitative and Restorative Service Providers"

## 2022-09-13 DIAGNOSIS — Z3490 Encounter for supervision of normal pregnancy, unspecified, unspecified trimester: Secondary | ICD-10-CM

## 2022-09-13 DIAGNOSIS — M25512 Pain in left shoulder: Secondary | ICD-10-CM | POA: Diagnosis present

## 2022-09-13 DIAGNOSIS — M542 Cervicalgia: Secondary | ICD-10-CM | POA: Insufficient documentation

## 2022-09-13 DIAGNOSIS — R252 Cramp and spasm: Secondary | ICD-10-CM | POA: Insufficient documentation

## 2022-09-13 DIAGNOSIS — M5412 Radiculopathy, cervical region: Secondary | ICD-10-CM

## 2022-09-13 NOTE — Assessment & Plan Note (Signed)
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 She still does have some periscapular symptoms down the hand to the second and third fingers. She stopped her Neurontin as she was found to be pregnant halfway through February. She will continue with formal PT for at least another month, if she plateaus we can consider MRI for epidural planning. We will give her another 2 weeks out of work. Return to see me if symptom relief plateaus with PT.

## 2022-09-13 NOTE — Progress Notes (Signed)
    Procedures performed today:    None.  Independent interpretation of notes and tests performed by another provider:   None.  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 She still does have some periscapular symptoms down the hand to the second and third fingers. She stopped her Neurontin as she was found to be pregnant halfway through February. She will continue with formal PT for at least another month, if she plateaus we can consider MRI for epidural planning. We will give her another 2 weeks out of work. Return to see me if symptom relief plateaus with PT.    ____________________________________________ Gwen Her. Dianah Field, M.D., ABFM., CAQSM., AME. Primary Care and Sports Medicine [facility]  Adjunct Professor of [employer]  Risk manager

## 2022-09-13 NOTE — Therapy (Signed)
OUTPATIENT PHYSICAL THERAPY CERVICAL TREATMENT   Patient Name: Erica Boyer MRN: [id number] DOB:Aug 26, 1985, 37 y.o., female Today's Date: 09/13/2022  END OF SESSION:  PT End of Session - 09/13/22 1314     Visit Number 5    Date for PT Re-Evaluation 10/12/22    Authorization Type UMR    PT Start Time 1315    PT Stop Time 1400    PT Time Calculation (min) 45 min    Activity Tolerance Patient tolerated treatment well    Behavior During Therapy WFL for tasks assessed/performed               Past Medical History:  Diagnosis Date   Asthma    Fructose intolerance    GERD (gastroesophageal reflux disease)    Lactose intolerance    Past Surgical History:  Procedure Laterality Date   NO PAST SURGERIES     Patient Active Problem List   Diagnosis Date Noted   Left cervical radiculopathy 07/22/2022   Seasonal allergies 11/13/2021   Gastroesophageal reflux disease 11/13/2021   History of vitamin D deficiency 11/13/2021   Patellofemoral syndrome, left 03/09/2021   Left ankle sprain 03/09/2021   Anxiety with depression 02/02/2021   Migraines 03/29/2006   Asthma 11/27/2003    PCP: Samuel Bouche, NP   REFERRING PROVIDER: Silverio Decamp,*   REFERRING DIAG: [id number] (ICD-10-CM) - Left cervical radiculopathy   THERAPY DIAG:  Cervicalgia  Acute pain of left shoulder  Cramp and spasm  Rationale for Evaluation and Treatment: Rehabilitation  ONSET DATE: February 2024  SUBJECTIVE:                                                                                                                                                                                                         SUBJECTIVE STATEMENT: Feeling L shoulder pain more today-- noting some difficulty getting comfortable at night for sleep.  She is early in pregnancy (10 weeks) and feels that getting comfortable in bed is harder from that too. Her pain is localized to neck and L posterior shoulder.  Patient  inquires bout note for return to work-- she has continued pain, but notes supposed to return to full duty on April 4.  PERTINENT HISTORY:  asthma PAIN:  Are you having pain? Yes: NPRS scale: 3-4/10 Pain location: back of the L shoulder  Pain description: nagging discomfort "not even ice pack worthy"  Aggravating factors: reaching overhead, trying to open jars/stanley cup/etc  Relieving factors: stretching  PRECAUTIONS: None WEIGHT BEARING RESTRICTIONS: No FALLS:  Has patient fallen in  last 6 months? 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, on light duty Hydrographic surveyor) PLOF: Independent PATIENT GOALS: get back to my normal activities at work and at home  NEXT MD VISIT: 09/13/22  OBJECTIVE:  (Measures in this section from initial evaluation unless otherwise noted) DIAGNOSTIC FINDINGS:  Negative Cspine xray 07/23/22 PATIENT SURVEYS:  FOTO 59 (predicted 25) COGNITION: Overall cognitive status: Within functional limits for tasks assessed SENSATION: WFL POSTURE: rounded shoulders, forward head, and increased thoracic kyphosis PALPATION: Palpation: TTP at ant L shoulder, suprapinatus, UT, LS, medial scapular border, lats, pects. Increased tissue tension in left UT, rhomboids, pects Spinal Mobility: pain and decreased thoracic mobility, pain with CPA and UPA mobs bil CERVICAL ROM:   Active ROM A/PROM (deg) eval  Flexion full  Extension full  Right lateral flexion full  Left lateral flexion full  Right rotation 80%  Left rotation 70%*   (Blank rows = not tested) UPPER EXTREMITY ROM:   ROM Right eval Left eval  Shoulder flexion  160/166  Shoulder extension  full  Shoulder abduction  147/180  Shoulder internal rotation  Full functional = to R  Shoulder external rotation  Full functional but tight behind head   (Blank rows = not tested) ABD - painful at 135 on; flex had pop at 160 with passive  UPPER EXTREMITY MMT:  MMT Right eval Left eval  Shoulder flexion  5  Shoulder  extension  5  Shoulder abduction  4+  Shoulder adduction    Shoulder internal rotation  5  Shoulder external rotation  4+  Middle trapezius    Lower trapezius     (Blank rows = not tested) CERVICAL SPECIAL TESTS:  Upper limb tension test (ULTT): Positive, Spurling's test: Negative, and Distraction test: Negative median and ulnar, neg radial SHOULDER SPECIAL TESTS: + Impingement  [facility] Adult PT Treatment:                                                DATE: 09/13/22 Therapeutic Exercise: Standing UBE x 34min/20 sec forward/ 1.5 minutes backwards Row with RTB x 12 reps Shoulder extension RTB x 12 reps L ER RTB x 12 reps Wall thoracic rotation with leaning on elbows Horizontal abduction x 10 reps for thoracic rotation Supine Coregous ball between shoulder blades for thoracic mobility Chin tucks with coregous ball Cervical rotation Sidelying Scapular mobilization Manual Therapy: STM biceps, infraspinatus due to palpable tightness STM neck-- suboccipital release, scalenes P/ROM neck  [facility] Adult PT Treatment:                                                DATE:  09/09/22  TherEx  L shoulder PROM/stretching all directions supine Supine shoulder flexion 2# 2x10 Sidelying shoulder ABD 2# 2x10  Supine serratus punches 2# 2x15  Scapular retractions red TB 2x10 with 3 second holds  Shoulder extensions red TB 2x10    09/06/22  TherEx  L shoulder PROM/stretches in supine Doorway pillowcase flexion stretch 10x5 second holds Doorway pillowcase ABD stretch 10x5 second holds  Seated scap retraction + ER red TB x10   Manual  STM L upper trap in supine + L middle delt in supine  Attempted MFR but mm tissue too irritable/increased pain  PATIENT EDUCATION:  Education details: HEP addition Person educated: Patient Education method: Consulting civil engineer, Demonstration, Verbal cues, and Handouts Education comprehension: verbalized understanding and returned demonstration  HOME EXERCISE  PROGRAM: Access Code: [email or web address] URL: [email or web address] Date: 09/13/2022 Prepared by: Rudell Cobb  Exercises - Shoulder External Rotation and Scapular Retraction with Resistance  - 1 x daily - 3 x weekly - 3 sets - 10 reps - Standing Shoulder Row with Anchored Resistance  - 1 x daily - 3 x weekly - 3 sets - 10 reps - Shoulder extension with resistance - Neutral  - 1 x daily - 3 x weekly - 3 sets - 10 reps - Seated Cervical Retraction  - 2 x daily - 7 x weekly - 1 sets - 10 reps - 5 sec hold - Seated Levator Scapulae Stretch  - 2 x daily - 7 x weekly - 1 sets - 3 reps - 30 sec hold - Seated Cervical Sidebending Stretch (Mirrored)  - 2 x daily - 7 x weekly - 1 sets - 3 reps - 30-60 sec hold - Seated Assisted Cervical Rotation with Towel  - 1 x daily - 7 x weekly - 3 sets - 10 reps - Standing with Forearms Thoracic Rotation  - 2 x daily - 7 x weekly - 1 sets - 10 reps  ASSESSMENT:  CLINICAL IMPRESSION: Kenedi continues with pain greater today than last week worse with overhead motion, and trying to get comfortable at night. She is point tender L infraspinatus and biceps musculature L UE. PT continues to progress strengthening, mobility to tolerance.   OBJECTIVE IMPAIRMENTS: decreased ROM, decreased strength, increased muscle spasms, impaired flexibility, impaired UE functional use, postural dysfunction, obesity, and pain.   GOALS: Goals reviewed with patient? Yes  SHORT TERM GOALS: Target date: 09/14/2022   Patient will be independent with initial HEP.  Baseline:  Goal status: INITIAL  2.  Patient to report negative N/T in L UE and neg nerve tension.  Baseline:  Goal status: INITIAL  LONG TERM GOALS: Target date: 10/12/2022   Patient will be independent with advanced/ongoing HEP to improve outcomes and carryover.  Baseline:  Goal status: INITIAL  2.  Patient will report 90% improvement in neck and left shoulder pain to improve QOL.  Baseline:  Goal status:  INITIAL  3.  Patient will demonstrate full pain free cervical and L shoulder ROM for safety with driving.  Baseline:  Goal status: INITIAL  4.  Patient will demonstrate improved posture to decrease muscle imbalance. Baseline:  Goal status: INITIAL  5.  Patient will report 30 on FOTO to demonstrate improved functional ability.  Baseline: 59 Goal status: INITIAL  6.  Patient will be able to perform regular job duties without increased pain in her neck and shoulder.   Baseline:  Goal status: INITIAL   PLAN:  PT FREQUENCY: 2x/week  PT DURATION: 6 weeks  PLANNED INTERVENTIONS: Therapeutic exercises, Therapeutic activity, Neuromuscular re-education, Patient/Family education, Self Care, Joint mobilization, Dry Needling, Electrical stimulation, Spinal mobilization, Cryotherapy, Moist heat, Taping, Traction, Ultrasound, Ionotophoresis 4mg /ml Dexamethasone, and Manual therapy  PLAN FOR NEXT SESSION: DN/MT to left upper quadrant (neck and parascapular mm, pects), progress upper back strengthening, chest opening, thoracic extension, progress to work specific strengthening. How did she feel after progressions last session?   [place], PT 09/13/22

## 2022-09-15 ENCOUNTER — Encounter: Payer: Self-pay | Admitting: Medical-Surgical

## 2022-09-15 ENCOUNTER — Telehealth: Payer: 59 | Admitting: Physician Assistant

## 2022-09-15 ENCOUNTER — Ambulatory Visit: Payer: 59

## 2022-09-15 DIAGNOSIS — J069 Acute upper respiratory infection, unspecified: Secondary | ICD-10-CM

## 2022-09-15 MED ORDER — FLUTICASONE PROPIONATE 50 MCG/ACT NA SUSP: 2.0000 | Freq: Every day | NASAL | 0 refills | Status: DC

## 2022-09-15 NOTE — Progress Notes (Signed)
E-Visit for Upper Respiratory Infection   We are sorry you are not feeling well.  Here is how we plan to help!  Based on what you have shared with me, it looks like you may have a viral upper respiratory infection.  Upper respiratory infections are caused by a large number of viruses; however, rhinovirus is the most common cause.   Symptoms vary from person to person, with common symptoms including sore throat, cough, fatigue or lack of energy and feeling of general discomfort.  A low-grade fever of up to 100.4 may present, but is often uncommon.  Symptoms vary however, and are closely related to a person's age or underlying illnesses.  The most common symptoms associated with an upper respiratory infection are nasal discharge or congestion, cough, sneezing, headache and pressure in the ears and face.  These symptoms usually persist for about 3 to 10 days, but can last up to 2 weeks.  It is important to know that upper respiratory infections do not cause serious illness or complications in most cases.    Upper respiratory infections can be transmitted from person to person, with the most common method of transmission being a person's hands.  The virus is able to live on the skin and can infect other persons for up to 2 hours after direct contact.  Also, these can be transmitted when someone coughs or sneezes; thus, it is important to cover the mouth to reduce this risk.  To keep the spread of the illness at bay, good hand hygiene is very important.  This is an infection that is most likely caused by a virus. There are no specific treatments other than to help you with the symptoms until the infection runs its course.  We are sorry you are not feeling well.  Here is how we plan to help!   For nasal congestion, you may use an oral decongestants such as Mucinex D or if you have glaucoma or high blood pressure use plain Mucinex.  Saline nasal spray or nasal drops can help and can safely be used as often as  needed for congestion.  For your congestion, I have prescribed Fluticasone nasal spray one spray in each nostril twice a day  If you do not have a history of heart disease, hypertension, diabetes or thyroid disease, prostate/bladder issues or glaucoma, you may also use Sudafed to treat nasal congestion.  It is highly recommended that you consult with a pharmacist or your primary care physician to ensure this medication is safe for you to take.     If you have a cough, you may use cough suppressants such as Delsym and Robitussin.  If you have glaucoma or high blood pressure, you can also use Coricidin HBP.    If you have a sore or scratchy throat, use a saltwater gargle-  to  teaspoon of salt dissolved in a 4-ounce to 8-ounce glass of warm water.  Gargle the solution for approximately 15-30 seconds and then spit.  It is important not to swallow the solution.  You can also use throat lozenges/cough drops and Chloraseptic spray to help with throat pain or discomfort.  Warm or cold liquids can also be helpful in relieving throat pain.  For headache, pain or general discomfort, you can use Ibuprofen or Tylenol as directed.   Some authorities believe that zinc sprays or the use of Echinacea may shorten the course of your symptoms.   HOME CARE Only take medications as instructed by your medical team.   Be sure to drink plenty of fluids. Water is fine as well as fruit juices, sodas and electrolyte beverages. You may want to stay away from caffeine or alcohol. If you are nauseated, try taking small sips of liquids. How do you know if you are getting enough fluid? Your urine should be a pale yellow or almost colorless. Get rest. Taking a steamy shower or using a humidifier may help nasal congestion and ease sore throat pain. You can place a towel over your head and breathe in the steam from hot water coming from a faucet. Using a saline nasal spray works much the same way. Cough drops, hard candies and sore  throat lozenges may ease your cough. Avoid close contacts especially the very young and the elderly Cover your mouth if you cough or sneeze Always remember to wash your hands.   GET HELP RIGHT AWAY IF: You develop worsening fever. If your symptoms do not improve within 10 days You develop yellow or green discharge from your nose over 3 days. You have coughing fits You develop a severe head ache or visual changes. You develop shortness of breath, difficulty breathing or start having chest pain Your symptoms persist after you have completed your treatment plan  MAKE SURE YOU  Understand these instructions. Will watch your condition. Will get help right away if you are not doing well or get worse.  Thank you for choosing an e-visit.  Your e-visit answers were reviewed by a board certified advanced clinical practitioner to complete your personal care plan. Depending upon the condition, your plan could have included both over the counter or prescription medications.  Please review your pharmacy choice. Make sure the pharmacy is open so you can pick up prescription now. If there is a problem, you may contact your provider through MyChart messaging and have the prescription routed to another pharmacy.  Your safety is important to us. If you have drug allergies check your prescription carefully.   For the next 24 hours you can use MyChart to ask questions about today's visit, request a non-urgent call back, or ask for a work or school excuse. You will get an email in the next two days asking about your experience. I hope that your e-visit has been valuable and will speed your recovery.  I have spent 5 minutes in review of e-visit questionnaire, review and updating patient chart, medical decision making and response to patient.   Elfa Wooton M Estus Krakowski, PA-C  

## 2022-09-16 ENCOUNTER — Telehealth: Payer: 59 | Admitting: Family Medicine

## 2022-09-16 ENCOUNTER — Encounter: Payer: Self-pay | Admitting: Emergency Medicine

## 2022-09-16 ENCOUNTER — Ambulatory Visit
Admission: EM | Admit: 2022-09-16 | Discharge: 2022-09-16 | Disposition: A | Discharge status: Home / Self Care | Payer: 59 | Attending: Family Medicine | Admitting: Family Medicine

## 2022-09-16 DIAGNOSIS — R0602 Shortness of breath: Secondary | ICD-10-CM

## 2022-09-16 DIAGNOSIS — J111 Influenza due to unidentified influenza virus with other respiratory manifestations: Secondary | ICD-10-CM | POA: Diagnosis not present

## 2022-09-16 DIAGNOSIS — J069 Acute upper respiratory infection, unspecified: Secondary | ICD-10-CM

## 2022-09-16 LAB — POC SARS CORONAVIRUS 2 AG -  ED: SARS Coronavirus 2 Ag: NEGATIVE

## 2022-09-16 LAB — POCT INFLUENZA A/B
Influenza A, POC: NEGATIVE
Influenza B, POC: NEGATIVE

## 2022-09-16 MED ORDER — ACETAMINOPHEN 500 MG PO TABS
1000.0000 mg | ORAL_TABLET | Freq: Four times a day (QID) | ORAL | Status: DC | PRN
  Administered 2022-09-16: 1000 mg via ORAL

## 2022-09-16 NOTE — ED Provider Notes (Signed)
[name]    CSN: [id number] Arrival date & time: 09/16/22  1507      History   Chief Complaint Chief Complaint  Patient presents with   Cough   Fever    HPI Erica Boyer is a 37 y.o. female.   HPI Patient is [redacted] weeks pregnant.  Has not yet seen an OB/GYN.  She does have underlying asthma.  She takes albuterol as needed. Currently has postnasal drip, sore throat, cough, congestion, increased shortness of breath for the last 4 to 5 days.  Today she feels worse.  She feels very tired.  She has a fever.  Arrives with a temperature of 100.7.  Has not taken Tylenol.  Feels short of breath.  Has not used her albuterol inhaler.  She called her OB/GYN and they recommended she obtain COVID and flu testing  Past Medical History:  Diagnosis Date   Asthma    Fructose intolerance    GERD (gastroesophageal reflux disease)    Lactose intolerance     Patient Active Problem List   Diagnosis Date Noted   Pregnant 09/13/2022   Left cervical radiculopathy 07/22/2022   Seasonal allergies 11/13/2021   Gastroesophageal reflux disease 11/13/2021   History of vitamin D deficiency 11/13/2021   Patellofemoral syndrome, left 03/09/2021   Left ankle sprain 03/09/2021   Anxiety with depression 02/02/2021   Migraines 03/29/2006   Asthma 11/27/2003    Past Surgical History:  Procedure Laterality Date   NO PAST SURGERIES      OB History     Gravida  4   Para  1   Term  1   Preterm      AB  2   Living  1      SAB  2   IAB      Ectopic      Multiple      Live Births               Home Medications    Prior to Admission medications   Medication Sig Start Date End Date Taking? Authorizing Provider  albuterol (VENTOLIN HFA) 108 (90 Base) MCG/ACT inhaler Inhale 2 puffs into the lungs every 4 (four) hours as needed for wheezing or shortness of breath. 08/06/22  Yes Jaynee Eagles, PA-C  buPROPion (WELLBUTRIN XL) 150 MG 24 hr tablet Take 1 tablet (150 mg  total) by mouth daily. 04/29/22  Yes Samuel Bouche, NP  cholecalciferol (VITAMIN D3) 25 MCG (1000 UNIT) tablet Take 1 tablet (1,000 Units total) by mouth daily. 11/13/21  Yes Samuel Bouche, NP  escitalopram (LEXAPRO) 10 MG tablet Take 1 tablet (10 mg total) by mouth daily. 04/29/22  Yes Samuel Bouche, NP  ferrous sulfate 325 (65 FE) MG tablet Take by mouth. 02/07/19  Yes [provider]  fluticasone (FLONASE) 50 MCG/ACT nasal spray Place 2 sprays into both nostrils daily. 09/15/22  Yes Mar Daring, PA-C  omeprazole (PRILOSEC) 40 MG capsule Take 1 capsule (40 mg total) by mouth daily. 11/13/21  Yes Samuel Bouche, NP  hydrOXYzine (ATARAX) 25 MG tablet Take 1/2 to 2 tablets (12.5-50 mg total) by mouth 3 times daily as needed. 04/29/22   Samuel Bouche, NP  ipratropium (ATROVENT) 0.03 % nasal spray Place 2 sprays into both nostrils every 12 (twelve) hours. 08/05/22   Perlie Mayo, NP    Family History Family History  Problem Relation Age of Onset   Hypertension Mother    Sleep apnea Mother  Hyperlipidemia Father    Heart failure Father    Esophageal cancer Father    Diabetes Maternal Grandmother    Heart failure Paternal Grandfather     Social History Social History   Tobacco Use   Smoking status: Never    Passive exposure: Never   Smokeless tobacco: Never  Vaping Use   Vaping Use: Never used  Substance Use Topics   Alcohol use: Yes    Comment: socially   Drug use: Never     Allergies   Cephalexin, Mometasone, Moxifloxacin, Sumatriptan, and Sumatriptan-naproxen sodium   Review of Systems Review of Systems See HPI  Physical Exam Triage Vital Signs ED Triage Vitals  Enc Vitals Group     BP 09/16/22 1519 132/87     Pulse Rate 09/16/22 1519 (!) 117     Resp 09/16/22 1519 (!) 22     Temp 09/16/22 1519 (!) 100.7 F (38.2 C)     Temp Source 09/16/22 1519 Oral     SpO2 09/16/22 1519 100 %     Weight --      Height --      Head Circumference --      Peak Flow --       Pain Score 09/16/22 1520 4     Pain Loc --      Pain Edu? --      Excl. in [place]? --    No data found.  Updated Vital Signs BP (!) 150/88 (BP Location: Right Arm)   Pulse (!) 113   Temp 99.3 F (37.4 C) (Oral)   Resp 20   LMP  (LMP Unknown)   SpO2 100%       Physical Exam Constitutional:      General: She is not in acute distress.    Appearance: She is well-developed. She is obese. She is ill-appearing.  HENT:     Head: Normocephalic and atraumatic.     Right Ear: Tympanic membrane normal.     Left Ear: Tympanic membrane normal.     Nose: Nose normal.     Mouth/Throat:     Mouth: Mucous membranes are moist.     Pharynx: No posterior oropharyngeal erythema.  Eyes:     Conjunctiva/sclera: Conjunctivae normal.     Pupils: Pupils are equal, round, and reactive to light.  Cardiovascular:     Rate and Rhythm: Normal rate and regular rhythm.     Heart sounds: Normal heart sounds.  Pulmonary:     Effort: Pulmonary effort is normal. No respiratory distress.     Breath sounds: Normal breath sounds. No wheezing, rhonchi or rales.  Abdominal:     General: There is no distension.     Palpations: Abdomen is soft.  Musculoskeletal:        General: Normal range of motion.     Cervical back: Normal range of motion.  Lymphadenopathy:     Cervical: Cervical adenopathy present.  Skin:    General: Skin is warm and dry.  Neurological:     General: No focal deficit present.     Mental Status: She is alert.      UC Treatments / Results  Labs (all labs ordered are listed, but only abnormal results are displayed) Labs Reviewed  POC SARS CORONAVIRUS 2 AG -  ED  POCT INFLUENZA A/B    EKG   Radiology No results found.  Procedures Procedures (including critical care time)  Medications Ordered in UC Medications  acetaminophen (TYLENOL) tablet 1,000 mg (  1,000 mg Oral Given 09/16/22 1554)    Initial Impression / Assessment and Plan / UC Course  I have reviewed the triage  vital signs and the nursing notes.  Pertinent labs & imaging results that were available during my care of the patient were reviewed by me and considered in my medical decision making (see chart for details).     COVID and flu testing are negative.  She has a unspecified viral illness.  Care discussed Final Clinical Impressions(s) / UC Diagnoses   Final diagnoses:  Influenza-like illness     Discharge Instructions      Make sure you are getting lots of rst.  Drink plenty of fluids Take OTC cough and cold medicines approved by OBGYN list Call for problems    ED Prescriptions   None    PDMP not reviewed this encounter.   Raylene Everts, MD 09/16/22 [phone]

## 2022-09-16 NOTE — ED Triage Notes (Signed)
Reports new post nasal drip and sore throat beginning Friday, started taking zyrtec and robitussin to manage symptoms. States she got up this morning and started having a fever, chills, and a productive cough with green sputum. Currently [redacted] weeks pregnant.

## 2022-09-16 NOTE — Progress Notes (Signed)
[facility]   On going symptoms and worsening shortness of breath since EV yesterday and pregnant- in person advised  Patient acknowledged agreement and understanding of the plan.

## 2022-09-16 NOTE — Discharge Instructions (Signed)
Make sure you are getting lots of rst.  Drink plenty of fluids Take OTC cough and cold medicines approved by OBGYN list Call for problems

## 2022-09-21 ENCOUNTER — Encounter: Payer: Self-pay | Admitting: Sports Medicine

## 2022-09-27 ENCOUNTER — Ambulatory Visit: Payer: 59

## 2022-09-27 DIAGNOSIS — M542 Cervicalgia: Secondary | ICD-10-CM

## 2022-09-27 DIAGNOSIS — R252 Cramp and spasm: Secondary | ICD-10-CM

## 2022-09-27 DIAGNOSIS — M25512 Pain in left shoulder: Secondary | ICD-10-CM

## 2022-09-27 NOTE — Therapy (Signed)
OUTPATIENT PHYSICAL THERAPY CERVICAL TREATMENT   Patient Name: Erica Boyer MRN: [id number] DOB:1985/08/22, 37 y.o., female Today's Date: 09/27/2022  END OF SESSION:  PT End of Session - 09/27/22 1449     Visit Number 6    Date for PT Re-Evaluation 10/12/22    Authorization Type UMR    PT Start Time 1447    PT Stop Time 1529    PT Time Calculation (min) 42 min    Activity Tolerance Patient tolerated treatment well    Behavior During Therapy WFL for tasks assessed/performed               Past Medical History:  Diagnosis Date   Asthma    Fructose intolerance    GERD (gastroesophageal reflux disease)    Lactose intolerance    Past Surgical History:  Procedure Laterality Date   NO PAST SURGERIES     Patient Active Problem List   Diagnosis Date Noted   Pregnant 09/13/2022   Left cervical radiculopathy 07/22/2022   Seasonal allergies 11/13/2021   Gastroesophageal reflux disease 11/13/2021   History of vitamin D deficiency 11/13/2021   Patellofemoral syndrome, left 03/09/2021   Left ankle sprain 03/09/2021   Anxiety with depression 02/02/2021   Migraines 03/29/2006   Asthma 11/27/2003    PCP: Christen Butter, NP   REFERRING PROVIDER: Christen Butter, NP   REFERRING DIAG: M54.12 (ICD-10-CM) - Left cervical radiculopathy   THERAPY DIAG:  Cervicalgia  Acute pain of left shoulder  Cramp and spasm  Rationale for Evaluation and Treatment: Rehabilitation  ONSET DATE: February 2024  SUBJECTIVE:                                                                                                                                                                                                         SUBJECTIVE STATEMENT: Patient reports 5/10 pain on L side of neck that radiates into shoulder. Patient states she has been on light duty at work, states she continues to have most difficulty with raising arm overhead and driving when using L arm.  PERTINENT HISTORY:   asthma PAIN:  Are you having pain? Yes: NPRS scale: 3-4/10 Pain location: back of the L shoulder  Pain description: nagging discomfort "not even ice pack worthy"  Aggravating factors: reaching overhead, trying to open jars/stanley cup/etc  Relieving factors: stretching  PRECAUTIONS: None WEIGHT BEARING RESTRICTIONS: No FALLS:  Has patient fallen in last 6 months? 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, on light duty Engineer, manufacturing systems) PLOF: Independent PATIENT GOALS: get back to my normal activities at work  and at home  NEXT MD VISIT: 09/13/22  OBJECTIVE:  (Measures in this section from initial evaluation unless otherwise noted) DIAGNOSTIC FINDINGS:  Negative Cspine xray 07/23/22 PATIENT SURVEYS:  FOTO 59 (predicted 73) 09/27/22: 45  COGNITION: Overall cognitive status: Within functional limits for tasks assessed  SENSATION: WFL  POSTURE: rounded shoulders, forward head, and increased thoracic kyphosis  PALPATION: Palpation: TTP at ant L shoulder, suprapinatus, UT, LS, medial scapular border, lats, pects. Increased tissue tension in left UT, rhomboids, pects Spinal Mobility: pain and decreased thoracic mobility, pain with CPA and UPA mobs bil CERVICAL ROM:   Active ROM A/PROM (deg) eval  Flexion full  Extension full  Right lateral flexion full  Left lateral flexion full  Right rotation 80%  Left rotation 70%*   (Blank rows = not tested) UPPER EXTREMITY ROM:   ROM Right eval Left eval  Shoulder flexion  160/166  Shoulder extension  full  Shoulder abduction  147/180  Shoulder internal rotation  Full functional = to R  Shoulder external rotation  Full functional but tight behind head   (Blank rows = not tested) ABD - painful at 135 on; flex had pop at 160 with passive  UPPER EXTREMITY MMT:  MMT Right eval Left eval  Shoulder flexion  5  Shoulder extension  5  Shoulder abduction  4+  Shoulder adduction    Shoulder internal rotation  5  Shoulder external  rotation  4+  Middle trapezius    Lower trapezius     (Blank rows = not tested) CERVICAL SPECIAL TESTS:  Upper limb tension test (ULTT): Positive, Spurling's test: Negative, and Distraction test: Negative median and ulnar, neg radial SHOULDER SPECIAL TESTS: + Impingement   [facility] Adult PT Treatment:                                                DATE: 09/27/2022 Therapeutic Exercise: Supine chin tucks with towel roll 15x3" Seated with noodle: Scap squeezes x10 No monies YTB x10 Standing rows + sternum lift GTB 2x10 Shoulder extension: low, mid, high RTB x10 each Cervical rotation SNAGs 5x5" B Manual Therapy: STM cervical paraspinals, SO, UT --> cervical upglides/downglides --> SOR    [facility] Adult PT Treatment:                                                DATE: 09/13/22 Therapeutic Exercise: Standing UBE x 44min/20 sec forward/ 1.5 minutes backwards Row with RTB x 12 reps Shoulder extension RTB x 12 reps L ER RTB x 12 reps Wall thoracic rotation with leaning on elbows Horizontal abduction x 10 reps for thoracic rotation Supine Coregous ball between shoulder blades for thoracic mobility Chin tucks with coregous ball Cervical rotation Sidelying Scapular mobilization Manual Therapy: STM biceps, infraspinatus due to palpable tightness STM neck-- suboccipital release, scalenes P/ROM neck   PATIENT EDUCATION:  Education details: HEP addition Person educated: Patient Education method: Programmer, multimedia, Facilities manager, Verbal cues, and Handouts Education comprehension: verbalized understanding and returned demonstration  HOME EXERCISE PROGRAM: Access Code: [email or web address] URL: [email or web address] Date: 09/13/2022 Prepared by: Margretta Ditty  Exercises - Shoulder External Rotation and Scapular Retraction with Resistance  - 1 x daily - 3 x weekly - 3 sets - 10  reps - Standing Shoulder Row with Anchored Resistance  - 1 x daily - 3 x weekly - 3 sets - 10 reps - Shoulder  extension with resistance - Neutral  - 1 x daily - 3 x weekly - 3 sets - 10 reps - Seated Cervical Retraction  - 2 x daily - 7 x weekly - 1 sets - 10 reps - 5 sec hold - Seated Levator Scapulae Stretch  - 2 x daily - 7 x weekly - 1 sets - 3 reps - 30 sec hold - Seated Cervical Sidebending Stretch (Mirrored)  - 2 x daily - 7 x weekly - 1 sets - 3 reps - 30-60 sec hold - Seated Assisted Cervical Rotation with Towel  - 1 x daily - 7 x weekly - 3 sets - 10 reps - Standing with Forearms Thoracic Rotation  - 2 x daily - 7 x weekly - 1 sets - 10 reps  ASSESSMENT:  CLINICAL IMPRESSION: Noted increased tension and tightness along L cervical paraspinals and suboccipital region. Cueing improved postural alignment and scapular mobility during standing rows and shoulder extension exercises. Patient reported decreased pain and improved cervical mobility by end of session.   OBJECTIVE IMPAIRMENTS: decreased ROM, decreased strength, increased muscle spasms, impaired flexibility, impaired UE functional use, postural dysfunction, obesity, and pain.   GOALS: Goals reviewed with patient? Yes  SHORT TERM GOALS: Target date: 09/14/2022   Patient will be independent with initial HEP.  Baseline:  Goal status: INITIAL  2.  Patient to report negative N/T in L UE and neg nerve tension.  Baseline:  Goal status: INITIAL  LONG TERM GOALS: Target date: 10/12/2022   Patient will be independent with advanced/ongoing HEP to improve outcomes and carryover.  Baseline:  Goal status: INITIAL  2.  Patient will report 90% improvement in neck and left shoulder pain to improve QOL.  Baseline:  Goal status: INITIAL  3.  Patient will demonstrate full pain free cervical and L shoulder ROM for safety with driving.  Baseline:  Goal status: INITIAL  4.  Patient will demonstrate improved posture to decrease muscle imbalance. Baseline:  Goal status: INITIAL  5.  Patient will report 52 on FOTO to demonstrate improved  functional ability.  Baseline: 59 Goal status: INITIAL  6.  Patient will be able to perform regular job duties without increased pain in her neck and shoulder.   Baseline:  Goal status: INITIAL   PLAN:  PT FREQUENCY: 2x/week  PT DURATION: 6 weeks  PLANNED INTERVENTIONS: Therapeutic exercises, Therapeutic activity, Neuromuscular re-education, Patient/Family education, Self Care, Joint mobilization, Dry Needling, Electrical stimulation, Spinal mobilization, Cryotherapy, Moist heat, Taping, Traction, Ultrasound, Ionotophoresis 4mg /ml Dexamethasone, and Manual therapy  PLAN FOR NEXT SESSION: DN/MT to left upper quadrant (neck and parascapular mm, pects), progress upper back strengthening, chest opening, thoracic extension, progress to work specific strengthening.   Sanjuana Mae, PTA 09/13/22

## 2022-09-28 ENCOUNTER — Ambulatory Visit (INDEPENDENT_AMBULATORY_CARE_PROVIDER_SITE_OTHER): Payer: 59 | Admitting: Obstetrics and Gynecology

## 2022-09-28 ENCOUNTER — Encounter: Payer: Self-pay | Admitting: Sports Medicine

## 2022-09-28 ENCOUNTER — Encounter: Payer: Self-pay | Admitting: Obstetrics and Gynecology

## 2022-09-28 ENCOUNTER — Other Ambulatory Visit (INDEPENDENT_AMBULATORY_CARE_PROVIDER_SITE_OTHER): Payer: 59

## 2022-09-28 VITALS — BP 123/88 | HR 106 | Wt 222.0 lb

## 2022-09-28 DIAGNOSIS — Z3481 Encounter for supervision of other normal pregnancy, first trimester: Secondary | ICD-10-CM | POA: Diagnosis not present

## 2022-09-28 DIAGNOSIS — Z3490 Encounter for supervision of normal pregnancy, unspecified, unspecified trimester: Secondary | ICD-10-CM

## 2022-09-28 DIAGNOSIS — Z23 Encounter for immunization: Secondary | ICD-10-CM | POA: Diagnosis not present

## 2022-09-28 DIAGNOSIS — O09521 Supervision of elderly multigravida, first trimester: Secondary | ICD-10-CM

## 2022-09-28 DIAGNOSIS — Z3A30 30 weeks gestation of pregnancy: Secondary | ICD-10-CM | POA: Diagnosis not present

## 2022-09-28 DIAGNOSIS — Z8759 Personal history of other complications of pregnancy, childbirth and the puerperium: Secondary | ICD-10-CM

## 2022-09-28 DIAGNOSIS — F418 Other specified anxiety disorders: Secondary | ICD-10-CM

## 2022-09-28 DIAGNOSIS — Z1339 Encounter for screening examination for other mental health and behavioral disorders: Secondary | ICD-10-CM | POA: Diagnosis not present

## 2022-09-28 DIAGNOSIS — G43109 Migraine with aura, not intractable, without status migrainosus: Secondary | ICD-10-CM

## 2022-09-28 DIAGNOSIS — Z31430 Encounter of female for testing for genetic disease carrier status for procreative management: Secondary | ICD-10-CM

## 2022-09-28 DIAGNOSIS — Z3A01 Less than 8 weeks gestation of pregnancy: Secondary | ICD-10-CM

## 2022-09-28 DIAGNOSIS — O09529 Supervision of elderly multigravida, unspecified trimester: Secondary | ICD-10-CM

## 2022-09-28 DIAGNOSIS — K219 Gastro-esophageal reflux disease without esophagitis: Secondary | ICD-10-CM

## 2022-09-28 DIAGNOSIS — O409XX0 Polyhydramnios, unspecified trimester, not applicable or unspecified: Secondary | ICD-10-CM | POA: Diagnosis not present

## 2022-09-28 DIAGNOSIS — J45909 Unspecified asthma, uncomplicated: Secondary | ICD-10-CM

## 2022-09-28 DIAGNOSIS — R7303 Prediabetes: Secondary | ICD-10-CM

## 2022-09-28 DIAGNOSIS — O3663X1 Maternal care for excessive fetal growth, third trimester, fetus 1: Secondary | ICD-10-CM | POA: Diagnosis not present

## 2022-09-28 HISTORY — DX: Encounter for supervision of normal pregnancy, unspecified, unspecified trimester: Z34.90

## 2022-09-28 NOTE — Progress Notes (Addendum)
History:   Erica Boyer is a 37 y.o. G4P1021 at [redacted]w[redacted]d by LMP being seen today for her first obstetrical visit.  Her obstetrical history is significant for advanced maternal age and obesity. Patient does intend to breast feed. Pregnancy history fully reviewed.  Patient reports no complaints.      HISTORY: OB History  Gravida Para Term Preterm AB Living  0 2 1  SAB IAB Ectopic Multiple Live Births  2 0 0 0 0    # Outcome Date GA Lbr Len/2nd Weight Sex Delivery Anes PTL Lv  4 Current           3 SAB           2 SAB           1 Term             Last pap smear was done 2023 and was normal  Past Medical History:  Diagnosis Date   Asthma    Fructose intolerance    GERD (gastroesophageal reflux disease)    Lactose intolerance    Vaginal Pap smear, abnormal    Past Surgical History:  Procedure Laterality Date   NO PAST SURGERIES     Family History  Problem Relation Age of Onset   Hypertension Mother    Sleep apnea Mother    Hyperlipidemia Father    Heart failure Father    Esophageal cancer Father    Throat cancer Father    Diabetes Maternal Grandmother    Heart failure Paternal Grandfather    Social History   Tobacco Use   Smoking status: Never    Passive exposure: Never   Smokeless tobacco: Never  Vaping Use   Vaping Use: Never used  Substance Use Topics   Alcohol use: Yes    Comment: socially   Drug use: Never   Allergies  Allergen Reactions   Cephalexin Hives, Itching and Shortness Of Breath   Mometasone Other (See Comments)    Blurred vision   Moxifloxacin Rash    dizziness   Sumatriptan Photosensitivity    Worsens migraine    Mometasone Furoate Other (See Comments)   Sumatriptan-Naproxen Sodium Photosensitivity   Current Outpatient Medications on File Prior to Visit  Medication Sig Dispense Refill   albuterol (VENTOLIN HFA) 108 (90 Base) MCG/ACT inhaler Inhale 2 puffs into the lungs every 4 (four) hours as needed for wheezing or  shortness of breath. 18 g 0   cholecalciferol (VITAMIN D3) 25 MCG (1000 UNIT) tablet Take 1 tablet (1,000 Units total) by mouth daily. 90 tablet 1   escitalopram (LEXAPRO) 10 MG tablet Take 1 tablet (10 mg total) by mouth daily. 90 tablet 3   ferrous sulfate 325 (65 FE) MG tablet Take by mouth.     fluticasone (FLONASE) 50 MCG/ACT nasal spray Place 2 sprays into both nostrils daily. 16 g 0   omeprazole (PRILOSEC) 40 MG capsule Take 1 capsule (40 mg total) by mouth daily. 90 capsule 3   Prenatal Vit-Fe Fumarate-FA (PRENATAL VITAMIN PO) Take by mouth.     buPROPion (WELLBUTRIN XL) 150 MG 24 hr tablet Take 1 tablet (150 mg total) by mouth daily. (Patient not taking: Reported on 09/28/2022) 90 tablet 1   No current facility-administered medications on file prior to visit.    Review of Systems Pertinent items noted in HPI and remainder of comprehensive ROS otherwise negative. Physical Exam:   Vitals:   09/28/22 1052  BP: 123/88  Pulse: Marland Kitchen)  106  Weight: 222 lb (100.7 kg)     Uterus:     System: General: well-developed, well-nourished female in no acute distress   Breasts:  Deferred    Skin: normal coloration and turgor, no rashes   Neurologic: oriented, normal, negative, normal mood   Extremities: normal strength, tone, and muscle mass, ROM of all joints is normal   HEENT PERRLA, extraocular movement intact and sclera clear, anicteric   Mouth/Teeth mucous membranes moist, pharynx normal without lesions and dental hygiene good   Neck supple and no masses   Cardiovascular: regular rate and rhythm   Respiratory:  no respiratory distress, normal breath sounds   Abdomen: soft, non-tender; bowel sounds normal; no masses,  no organomegaly   Patient informed that the ultrasound is considered a limited obstetric ultrasound and is not intended to be a complete ultrasound exam.  Patient also informed that the ultrasound is not being completed with the intent of assessing for fetal or placental  anomalies or any pelvic abnormalities.  Explained that the purpose of today's ultrasound is to assess for fetal heart rate.  Patient acknowledges the purpose of the exam and the limitations of the study.     Assessment:    Pregnancy: [id number] Patient Active Problem List   Diagnosis Date Noted   Supervision of normal pregnancy 09/28/2022   Pregnant 09/13/2022   Left cervical radiculopathy 07/22/2022   Seasonal allergies 11/13/2021   Gastroesophageal reflux disease 11/13/2021   History of vitamin D deficiency 11/13/2021   Patellofemoral syndrome, left 03/09/2021   Left ankle sprain 03/09/2021   Anxiety with depression 02/02/2021   Migraines 03/29/2006   Asthma 11/27/2003     Plan:   1. Encounter for supervision of normal pregnancy, antepartum, unspecified gravidity  - Pregnancy, Initial Screen - Babyscripts Schedule Optimization - US OB Limited; Future - US OB Limited; Future - Protein / creatinine ratio, urine - CBC - Comp Met (CMET) - TSH - HgB A1c - [place] MFM OB DETAIL +14 WK; Future  2. Migraine with aura and without status migrainosus, not intractable  Migraine free for well over 1 year.   3. Mild asthma without complication, unspecified whether persistent  Has rescue inhaler at home.  Does not  use often.   4. Anxiety with depression  Doing well on lexapro Discussed risk of fetal withdrawal from lexapro use in pregnancy; although low risk.   5. History of gestational hypertension  No records to review Denies Preeclampsia history Baseline labs today: CBC, CMP, PCR Start BASA 81 mg daily until delivery.   6. Gastroesophageal reflux disease, unspecified whether esophagitis present  Well controlled on Prilosec   7. Antepartum multigravida of advanced maternal age   37. Encounter of female for testing for genetic disease carrier status for procreative management  - [facility] Basic Panel   9. Class 3 severe obesity in adult, unspecified BMI, unspecified  obesity type, unspecified whether serious comorbidity present  ASA Growth [place] Q 4 weeks per MFM   Initial labs drawn. Continue prenatal vitamins. Genetic Screening discussed, NIPS: requested. Ultrasound discussed; fetal anatomic survey: requested. Problem list reviewed and updated. The nature of [facility] with multiple MDs and other Advanced Practice Providers was explained to patient; also emphasized that residents, students are part of our team. Routine obstetric precautions reviewed.    Ranferi Clingan, Harolyn Rutherford, NP Faculty [facility] Center for [employer], [facility]

## 2022-09-28 NOTE — Patient Instructions (Signed)

## 2022-09-30 ENCOUNTER — Encounter: Payer: Self-pay | Admitting: Physical Therapy

## 2022-09-30 ENCOUNTER — Ambulatory Visit: Payer: 59 | Admitting: Physical Therapy

## 2022-09-30 DIAGNOSIS — R252 Cramp and spasm: Secondary | ICD-10-CM

## 2022-09-30 DIAGNOSIS — M25512 Pain in left shoulder: Secondary | ICD-10-CM | POA: Diagnosis not present

## 2022-09-30 DIAGNOSIS — M542 Cervicalgia: Secondary | ICD-10-CM

## 2022-09-30 LAB — PREGNANCY, INITIAL SCREEN
Antibody Screen: NEGATIVE
Basophils Absolute: 0.1 10*3/uL (ref 0.0–0.2)
Basos: 1 %
Bilirubin, UA: NEGATIVE
Chlamydia trachomatis, NAA: NEGATIVE
EOS (ABSOLUTE): 0.1 10*3/uL (ref 0.0–0.4)
Eos: 1 %
Glucose, UA: NEGATIVE
HCV Ab: NONREACTIVE
HIV Screen 4th Generation wRfx: NONREACTIVE
Hematocrit: 36.6 % (ref 34.0–46.6)
Hemoglobin: 11.8 g/dL (ref 11.1–15.9)
Hepatitis B Surface Ag: NEGATIVE
Immature Grans (Abs): 0.1 10*3/uL (ref 0.0–0.1)
Immature Granulocytes: 1 %
Ketones, UA: NEGATIVE
Leukocytes,UA: NEGATIVE
Lymphocytes Absolute: 2.8 10*3/uL (ref 0.7–3.1)
Lymphs: 24 %
MCH: 28.6 pg (ref 26.6–33.0)
MCHC: 32.2 g/dL (ref 31.5–35.7)
MCV: 89 fL (ref 79–97)
Monocytes Absolute: 1 10*3/uL — ABNORMAL HIGH (ref 0.1–0.9)
Monocytes: 9 %
Neisseria Gonorrhoeae by PCR: NEGATIVE
Neutrophils Absolute: 7.4 10*3/uL — ABNORMAL HIGH (ref 1.4–7.0)
Neutrophils: 64 %
Nitrite, UA: NEGATIVE
Platelets: 357 10*3/uL (ref 150–450)
Protein,UA: NEGATIVE
RBC, UA: NEGATIVE
RBC: 4.13 x10E6/uL (ref 3.77–5.28)
RDW: 12.7 % (ref 11.7–15.4)
RPR Ser Ql: NONREACTIVE
Rh Factor: POSITIVE
Rubella Antibodies, IGG: 1.07 index (ref >0.99)
Specific Gravity, UA: 1.028 (ref 1.005–1.030)
Urobilinogen, Ur: 0.2 mg/dL (ref 0.2–1.0)
WBC: 11.3 10*3/uL — ABNORMAL HIGH (ref 3.4–10.8)
pH, UA: 5.5 (ref 5.0–7.5)

## 2022-09-30 LAB — COMPREHENSIVE METABOLIC PANEL
ALT: 29 IU/L (ref 0–32)
AST: 21 IU/L (ref 0–40)
Albumin/Globulin Ratio: 1.6 (ref 1.2–2.2)
Albumin: 4.4 g/dL (ref 3.9–4.9)
Alkaline Phosphatase: 94 IU/L (ref 44–121)
BUN/Creatinine Ratio: 16 (ref 9–23)
BUN: 9 mg/dL (ref 6–20)
Bilirubin Total: <0.2 mg/dL (ref 0.0–1.2)
CO2: 20 mmol/L (ref 20–29)
Calcium: 10.2 mg/dL (ref 8.7–10.2)
Chloride: 101 mmol/L (ref 96–106)
Creatinine, Ser: 0.57 mg/dL (ref 0.57–1.00)
Globulin, Total: 2.8 g/dL (ref 1.5–4.5)
Glucose: 72 mg/dL (ref 70–99)
Potassium: 4.1 mmol/L (ref 3.5–5.2)
Sodium: 137 mmol/L (ref 134–144)
Total Protein: 7.2 g/dL (ref 6.0–8.5)
eGFR: 121 mL/min/{1.73_m2} (ref >59)

## 2022-09-30 LAB — PROTEIN / CREATININE RATIO, URINE
Creatinine, Urine: 179.2 mg/dL
Protein, Ur: 11.7 mg/dL
Protein/Creat Ratio: 65 mg/g creat (ref 0–200)

## 2022-09-30 LAB — MICROSCOPIC EXAMINATION
Bacteria, UA: NONE SEEN
Casts: NONE SEEN /lpf
WBC, UA: NONE SEEN /hpf (ref 0–5)

## 2022-09-30 LAB — TSH: TSH: 1.87 u[IU]/mL (ref 0.450–4.500)

## 2022-09-30 LAB — URINE CULTURE, OB REFLEX

## 2022-09-30 LAB — HEMOGLOBIN A1C
Est. average glucose Bld gHb Est-mCnc: 117 mg/dL
Hgb A1c MFr Bld: 5.7 % — ABNORMAL HIGH (ref 4.8–5.6)

## 2022-09-30 LAB — HCV INTERPRETATION

## 2022-09-30 NOTE — Therapy (Signed)
OUTPATIENT PHYSICAL THERAPY CERVICAL TREATMENT   Patient Name: Erica Boyer MRN: [id number] DOB:10/20/1985, 37 y.o., female Today's Date: 09/30/2022  END OF SESSION:  PT End of Session - 09/30/22 1204     Visit Number 7    Date for PT Re-Evaluation 10/12/22    Authorization Type UMR    PT Start Time 1156    PT Stop Time 1226    PT Time Calculation (min) 30 min    Activity Tolerance Patient tolerated treatment well    Behavior During Therapy WFL for tasks assessed/performed                Past Medical History:  Diagnosis Date   Asthma    Fructose intolerance    GERD (gastroesophageal reflux disease)    Lactose intolerance    Vaginal Pap smear, abnormal    Past Surgical History:  Procedure Laterality Date   NO PAST SURGERIES     Patient Active Problem List   Diagnosis Date Noted   Supervision of normal pregnancy 09/28/2022   Pregnant 09/13/2022   Left cervical radiculopathy 07/22/2022   Seasonal allergies 11/13/2021   Gastroesophageal reflux disease 11/13/2021   History of vitamin D deficiency 11/13/2021   Patellofemoral syndrome, left 03/09/2021   Left ankle sprain 03/09/2021   Anxiety with depression 02/02/2021   Migraines 03/29/2006   Asthma 11/27/2003    PCP: Christen Butter, NP   REFERRING PROVIDER: Christen Butter, NP   REFERRING DIAG: M54.12 (ICD-10-CM) - Left cervical radiculopathy   THERAPY DIAG:  Cervicalgia  Acute pain of left shoulder  Cramp and spasm  Rationale for Evaluation and Treatment: Rehabilitation  ONSET DATE: February 2024  SUBJECTIVE:                                                                                                                                                                                                         SUBJECTIVE STATEMENT:  It was a little rough earlier in the week, last time I came the whole area was flared up now its feeling better. Not much has been going on otherwise. I think the increased  pain is from how I slept that one time. A lot of things are above my head at work, so have to reach overhead a lot.  PERTINENT HISTORY:  asthma PAIN:  Are you having pain? Yes: NPRS scale: 2/10 Pain location: L upper trap  Pain description: sharp pull, taut  Aggravating factors: laying on L side dependent on positioning  Relieving factors: not reaching above my head  PRECAUTIONS: None WEIGHT  BEARING RESTRICTIONS: No FALLS:  Has patient fallen in last 6 months? 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, on light duty Engineer, manufacturing systems) PLOF: Independent PATIENT GOALS: get back to my normal activities at work and at home  NEXT MD VISIT: 09/13/22  OBJECTIVE:  (Measures in this section from initial evaluation unless otherwise noted) DIAGNOSTIC FINDINGS:  Negative Cspine xray 07/23/22 PATIENT SURVEYS:  FOTO 59 (predicted 73) 09/27/22: 45  COGNITION: Overall cognitive status: Within functional limits for tasks assessed  SENSATION: WFL  POSTURE: rounded shoulders, forward head, and increased thoracic kyphosis  PALPATION: Palpation: TTP at ant L shoulder, suprapinatus, UT, LS, medial scapular border, lats, pects. Increased tissue tension in left UT, rhomboids, pects Spinal Mobility: pain and decreased thoracic mobility, pain with CPA and UPA mobs bil CERVICAL ROM:   Active ROM A/PROM (deg) eval  Flexion full  Extension full  Right lateral flexion full  Left lateral flexion full  Right rotation 80%  Left rotation 70%*   (Blank rows = not tested) UPPER EXTREMITY ROM:   ROM Right eval Left eval  Shoulder flexion  160/166  Shoulder extension  full  Shoulder abduction  147/180  Shoulder internal rotation  Full functional = to R  Shoulder external rotation  Full functional but tight behind head   (Blank rows = not tested) ABD - painful at 135 on; flex had pop at 160 with passive  UPPER EXTREMITY MMT:  MMT Right eval Left eval  Shoulder flexion  5  Shoulder extension  5   Shoulder abduction  4+  Shoulder adduction    Shoulder internal rotation  5  Shoulder external rotation  4+  Middle trapezius    Lower trapezius     (Blank rows = not tested) CERVICAL SPECIAL TESTS:  Upper limb tension test (ULTT): Positive, Spurling's test: Negative, and Distraction test: Negative median and ulnar, neg radial SHOULDER SPECIAL TESTS: + Impingement   [facility] Adult PT Treatment:                                                DATE:   09/30/22  TherEx  Upper trap stretch 3x30 seconds B Levator scap stretch  3x30 seconds B Scalene stretch 3x30 seconds B Thoracic extensions x10  Green TB shoulder extensions x12 Scapular retractions green TB x12      09/27/2022 Therapeutic Exercise: Supine chin tucks with towel roll 15x3" Seated with noodle: Scap squeezes x10 No monies YTB x10 Standing rows + sternum lift GTB 2x10 Shoulder extension: low, mid, high RTB x10 each Cervical rotation SNAGs 5x5" B Manual Therapy: STM cervical paraspinals, SO, UT --> cervical upglides/downglides --> SOR    [facility] Adult PT Treatment:                                                DATE: 09/13/22 Therapeutic Exercise: Standing UBE x 41min/20 sec forward/ 1.5 minutes backwards Row with RTB x 12 reps Shoulder extension RTB x 12 reps L ER RTB x 12 reps Wall thoracic rotation with leaning on elbows Horizontal abduction x 10 reps for thoracic rotation Supine Coregous ball between shoulder blades for thoracic mobility Chin tucks with coregous ball Cervical rotation Sidelying Scapular mobilization Manual Therapy: STM biceps, infraspinatus due  to palpable tightness STM neck-- suboccipital release, scalenes P/ROM neck   PATIENT EDUCATION:  Education details: HEP addition Person educated: Patient Education method: Explanation, Demonstration, Verbal cues, and Handouts Education comprehension: verbalized understanding and returned demonstration  HOME EXERCISE PROGRAM: Access Code:  [email or web address] URL: [email or web address] Date: 09/13/2022 Prepared by: Margretta Ditty  Exercises - Shoulder External Rotation and Scapular Retraction with Resistance  - 1 x daily - 3 x weekly - 3 sets - 10 reps - Standing Shoulder Row with Anchored Resistance  - 1 x daily - 3 x weekly - 3 sets - 10 reps - Shoulder extension with resistance - Neutral  - 1 x daily - 3 x weekly - 3 sets - 10 reps - Seated Cervical Retraction  - 2 x daily - 7 x weekly - 1 sets - 10 reps - 5 sec hold - Seated Levator Scapulae Stretch  - 2 x daily - 7 x weekly - 1 sets - 3 reps - 30 sec hold - Seated Cervical Sidebending Stretch (Mirrored)  - 2 x daily - 7 x weekly - 1 sets - 3 reps - 30-60 sec hold - Seated Assisted Cervical Rotation with Towel  - 1 x daily - 7 x weekly - 3 sets - 10 reps - Standing with Forearms Thoracic Rotation  - 2 x daily - 7 x weekly - 1 sets - 10 reps  ASSESSMENT:  CLINICAL IMPRESSION:  Ardice arrives late today, we focused on functional stretches and strengthening per her request. Pain is much better since last session. Will continue to progress as able and tolerated.    OBJECTIVE IMPAIRMENTS: decreased ROM, decreased strength, increased muscle spasms, impaired flexibility, impaired UE functional use, postural dysfunction, obesity, and pain.   GOALS: Goals reviewed with patient? Yes  SHORT TERM GOALS: Target date: 09/14/2022   Patient will be independent with initial HEP.  Baseline:  Goal status: INITIAL  2.  Patient to report negative N/T in L UE and neg nerve tension.  Baseline:  Goal status: INITIAL  LONG TERM GOALS: Target date: 10/12/2022   Patient will be independent with advanced/ongoing HEP to improve outcomes and carryover.  Baseline:  Goal status: INITIAL  2.  Patient will report 90% improvement in neck and left shoulder pain to improve QOL.  Baseline:  Goal status: INITIAL  3.  Patient will demonstrate full pain free cervical and L shoulder ROM  for safety with driving.  Baseline:  Goal status: INITIAL  4.  Patient will demonstrate improved posture to decrease muscle imbalance. Baseline:  Goal status: INITIAL  5.  Patient will report 61 on FOTO to demonstrate improved functional ability.  Baseline: 59 Goal status: INITIAL  6.  Patient will be able to perform regular job duties without increased pain in her neck and shoulder.   Baseline:  Goal status: INITIAL   PLAN:  PT FREQUENCY: 2x/week  PT DURATION: 6 weeks  PLANNED INTERVENTIONS: Therapeutic exercises, Therapeutic activity, Neuromuscular re-education, Patient/Family education, Self Care, Joint mobilization, Dry Needling, Electrical stimulation, Spinal mobilization, Cryotherapy, Moist heat, Taping, Traction, Ultrasound, Ionotophoresis /ml Dexamethasone, and Manual therapy  PLAN FOR NEXT SESSION: Only has one more session scheduled, may need progress note/more sessions. DN/MT to left upper quadrant (neck and parascapular mm, pects), progress upper back strengthening, chest opening, thoracic extension, progress to work specific strengthening.   Nedra Hai PT DPT PN2

## 2022-10-01 DIAGNOSIS — R7303 Prediabetes: Secondary | ICD-10-CM | POA: Insufficient documentation

## 2022-10-05 ENCOUNTER — Ambulatory Visit: Payer: 59 | Admitting: Medical-Surgical

## 2022-10-05 ENCOUNTER — Encounter: Payer: Self-pay | Admitting: Medical-Surgical

## 2022-10-05 ENCOUNTER — Ambulatory Visit: Payer: 59

## 2022-10-05 VITALS — BP 112/75 | HR 93 | Resp 20 | Ht 61.0 in | Wt 225.4 lb

## 2022-10-05 DIAGNOSIS — J019 Acute sinusitis, unspecified: Secondary | ICD-10-CM | POA: Diagnosis not present

## 2022-10-05 DIAGNOSIS — B9689 Other specified bacterial agents as the cause of diseases classified elsewhere: Secondary | ICD-10-CM | POA: Diagnosis not present

## 2022-10-05 DIAGNOSIS — E669 Obesity, unspecified: Secondary | ICD-10-CM | POA: Insufficient documentation

## 2022-10-05 DIAGNOSIS — D649 Anemia, unspecified: Secondary | ICD-10-CM | POA: Insufficient documentation

## 2022-10-05 MED ORDER — AMOXICILLIN-POT CLAVULANATE 875-125 MG PO TABS: 1.0000 | ORAL_TABLET | Freq: Two times a day (BID) | ORAL | 0 refills | Status: DC

## 2022-10-05 NOTE — Progress Notes (Signed)
        Established patient visit  History, exam, impression, and plan:  1. Acute bacterial sinusitis Very pleasant 37 year old female who is currently [redacted] weeks pregnant presenting today with reports of upper respiratory symptoms including sinus pressure, sinus congestion, facial pain, dental pain, PND/rhinorrhea, bilateral ear pain, sore/scratchy throat, and cough.  Has had symptoms over the last 2.5 weeks that were originally treated for viral etiology.  Unfortunately, the symptoms have continued to worsen rather than improve.  Reports nasal discharge and cough productive of thick yellowish-green sputum/mucus.  Has tried Tylenol, Zyrtec, Flonase, and other conservative measures without relief of symptoms.  On exam, bilateral TMs showing clear effusions without erythema.  Maxillary and frontal sinus tenderness.  Mild cervical lymphadenopathy with tenderness.  Posterior oropharyngeal erythema with cobblestoning and mild erythema, clear exudate.  HRR, S1/S2 normal.  Lungs CTA, respirations even and unlabored.  Symptoms consistent with bacterial sinusitis.  Treating with Augmentin twice daily x 5 days.  Continue conservative measures at home.  Procedures performed this visit: None.  Return if symptoms worsen or fail to improve.  __________________________________ Thayer Ohm, DNP, APRN, FNP-BC Primary Care and Sports Medicine [facility]

## 2022-10-11 ENCOUNTER — Ambulatory Visit: Payer: 59 | Admitting: Sports Medicine

## 2022-10-22 ENCOUNTER — Other Ambulatory Visit: Payer: Self-pay | Admitting: Physician Assistant

## 2022-10-22 ENCOUNTER — Other Ambulatory Visit (HOSPITAL_COMMUNITY): Payer: Self-pay

## 2022-10-22 DIAGNOSIS — Z3401 Encounter for supervision of normal first pregnancy, first trimester: Secondary | ICD-10-CM | POA: Diagnosis not present

## 2022-10-22 MED ORDER — FLUTICASONE PROPIONATE 50 MCG/ACT NA SUSP: 2.0000 | Freq: Every day | NASAL | 0 refills | Status: AC

## 2022-10-30 ENCOUNTER — Other Ambulatory Visit (HOSPITAL_COMMUNITY): Payer: Self-pay

## 2022-10-31 LAB — PANORAMA PRENATAL TEST FULL PANEL:PANORAMA TEST PLUS 5 ADDITIONAL MICRODELETIONS: FETAL FRACTION: 3.1

## 2022-11-02 ENCOUNTER — Ambulatory Visit (INDEPENDENT_AMBULATORY_CARE_PROVIDER_SITE_OTHER): Payer: 59 | Admitting: Student

## 2022-11-02 VITALS — BP 124/83 | HR 99 | Wt 223.0 lb

## 2022-11-02 DIAGNOSIS — Z3490 Encounter for supervision of normal pregnancy, unspecified, unspecified trimester: Secondary | ICD-10-CM

## 2022-11-02 DIAGNOSIS — F418 Other specified anxiety disorders: Secondary | ICD-10-CM

## 2022-11-02 DIAGNOSIS — R7303 Prediabetes: Secondary | ICD-10-CM

## 2022-11-02 DIAGNOSIS — Z8759 Personal history of other complications of pregnancy, childbirth and the puerperium: Secondary | ICD-10-CM

## 2022-11-02 DIAGNOSIS — O219 Vomiting of pregnancy, unspecified: Secondary | ICD-10-CM

## 2022-11-02 DIAGNOSIS — O09529 Supervision of elderly multigravida, unspecified trimester: Secondary | ICD-10-CM

## 2022-11-02 LAB — HORIZON CUSTOM: REPORT SUMMARY: NEGATIVE

## 2022-11-02 MED ORDER — METOCLOPRAMIDE HCL 10 MG PO TABS
10.0000 mg | ORAL_TABLET | Freq: Three times a day (TID) | ORAL | 0 refills | Status: DC | PRN
Start: 2022-11-02 — End: 2023-05-21

## 2022-11-02 NOTE — Progress Notes (Unsigned)
   PRENATAL VISIT NOTE  Subjective:  Erica Boyer is a 37 y.o. G4P1021 at 107w1d being seen today for ongoing prenatal care.  She is currently monitored for the following issues for this high-risk pregnancy and has Asthma; Migraines; Anxiety with depression; Gastroesophageal reflux disease; History of vitamin D deficiency; Supervision of normal pregnancy; Pre-diabetes; Anemia; and Obesity on their problem list.  Patient reports nausea.  States that is mainly nauseous when having to go long periods of time without eating. Contractions: Not present. Vag. Bleeding: None.  Movement: Absent. Denies leaking of fluid.   The following portions of the patient's history were reviewed and updated as appropriate: allergies, current medications, past family history, past medical history, past social history, past surgical history and problem list.   Objective:   Vitals:   11/02/22 1422  BP: 124/83  Pulse: 99  Weight: 223 lb (101.2 kg)    Fetal Status: Fetal Heart Rate (bpm): 170   Movement: Absent     General:  Alert, oriented and cooperative. Patient is in no acute distress.  Skin: Skin is warm and dry. No rash noted.   Cardiovascular: Normal heart rate noted  Respiratory: Normal respiratory effort, no problems with respiration noted  Abdomen: Soft, gravid, appropriate for gestational age.  Pain/Pressure: Absent     Pelvic: Cervical exam deferred        Extremities: Normal range of motion.  Edema: None  Mental Status: Normal mood and affect. Normal behavior. Normal judgment and thought content.   Assessment and Plan:  Pregnancy: G4P1021 at [redacted]w[redacted]d 1. Encounter for supervision of normal pregnancy, antepartum, unspecified gravidity - Doing well, positive spirits - Provided information on doula services - Glucose Tolerance, 2 Hours w/1 Hour; Future  2. History of gestational hypertension - normal BP - recommend bASA, taking low dose OTC option   3. Anxiety with depression - stable on  Lexapro  4. Antepartum multigravida of advanced maternal age - detail anatomy scan is scheduled - normal NIP screen  5. Pre-diabetes - Discussed A1c and recommendation for further diagnostic testing.  - Glucose Tolerance, 2 Hours w/1 Hour; Future  6. Class 3 severe obesity in adult, unspecified BMI, unspecified obesity type, unspecified whether serious comorbidity present (HCC) - BMI:42, will follow with [place] as indicated  7. Nausea/vomiting in pregnancy - Encouraged to eat small frequent snacks and avoid skipping meals. Provided options for Scop Patch or anti-nausea band. Continue taking B-6 supplement.  - metoCLOPramide (REGLAN) 10 MG tablet; Take 1 tablet (10 mg total) by mouth every 8 (eight) hours as needed for nausea. Ideally 30 minutes prior to meal and at bedtime  Dispense: 30 tablet; Refill: 0   Preterm labor symptoms and general obstetric precautions including but not limited to vaginal bleeding, contractions, leaking of fluid and fetal movement were reviewed in detail with the patient. Please refer to After Visit Summary for other counseling recommendations.   Return in about 4 weeks (around 11/30/2022) for LOB, IN-PERSON, VIRTUAL.  Future Appointments  Date Time Provider Department Center  11/30/2022  2:30 PM Rober Minion [facility]  12/20/2022  1:15 PM [facility] NURSE [facility]  12/20/2022  1:30 PM [facility] US3 [facility]    Corlis Hove, NP

## 2022-11-12 ENCOUNTER — Other Ambulatory Visit: Payer: Self-pay

## 2022-11-12 DIAGNOSIS — R7303 Prediabetes: Secondary | ICD-10-CM

## 2022-11-12 DIAGNOSIS — Z3490 Encounter for supervision of normal pregnancy, unspecified, unspecified trimester: Secondary | ICD-10-CM

## 2022-11-17 ENCOUNTER — Encounter: Payer: Self-pay | Admitting: Medical-Surgical

## 2022-11-17 ENCOUNTER — Ambulatory Visit: Payer: 59 | Admitting: Medical-Surgical

## 2022-11-17 VITALS — BP 107/72 | HR 92 | Resp 20 | Ht 61.0 in | Wt 221.0 lb

## 2022-11-17 DIAGNOSIS — K625 Hemorrhage of anus and rectum: Secondary | ICD-10-CM | POA: Diagnosis not present

## 2022-11-17 LAB — CBC
HCT: 36.1 % (ref 35.0–45.0)
Hemoglobin: 11.8 g/dL (ref 11.7–15.5)
MCH: 28.9 pg (ref 27.0–33.0)
MCHC: 32.7 g/dL (ref 32.0–36.0)
MCV: 88.3 fL (ref 80.0–100.0)
MPV: 9.4 fL (ref 7.5–12.5)
Platelets: 322 10*3/uL (ref 140–400)
RBC: 4.09 10*6/uL (ref 3.80–5.10)
RDW: 12.2 % (ref 11.0–15.0)
WBC: 9.7 10*3/uL (ref 3.8–10.8)

## 2022-11-17 MED ORDER — POLYETHYLENE GLYCOL 3350 17 GM/SCOOP PO POWD: 17.0000 g | Freq: Two times a day (BID) | ORAL | 1 refills | Status: DC | PRN

## 2022-11-17 MED ORDER — HYDROCORTISONE ACETATE 25 MG RE SUPP: 25.0000 mg | Freq: Two times a day (BID) | RECTAL | 0 refills | Status: DC

## 2022-11-17 NOTE — Progress Notes (Signed)
        Established patient visit  History, exam, impression, and plan:  1. Rectal bleeding Pleasant 37 year old pregnant female presenting today with complaints of approximately 1 week of bright red blood per rectum.  Notes that she has been seeing blood in the stool, in the toilet water, and on the tissue.  Initially started off light but over the last week has gotten heavier.  No abdominal pain associated.  Does note that her bowel movements require some straining at times.  Notable for an episode of diarrhea last night with no blood present.  Has had some dizziness and lightheadedness over the last week.  Has not tried any home remedies or over-the-counter medications although she did consider possibly adding MiraLAX to soften her stools.  Eating and drinking normally.  No fever, chills, nausea, vomiting, or hematemesis.  See below for physical exam.  We did defer rectal examination today in an effort to prevent irritation/worsening of her symptoms.  Checking CBC.  No history of external hemorrhoids but consider possible internal hemorrhoids.  Also considered an anal fissure however with no significant pain with bowel movements and a nonbloody BM last night, lower suspicion of this.  Plan to treat with Anusol suppositories twice daily x 5-6 days and continue to monitor for bleeding.  Adding MiraLAX 17 g daily as needed for stool softening.  If poor response to Anusol suppositories, recommend she return for a full rectal exam to evaluate for hemorrhoids/anal fissures. - CBC  Physical Exam Vitals reviewed.  Constitutional:      General: She is not in acute distress.    Appearance: Normal appearance. She is not ill-appearing.  HENT:     Head: Normocephalic and atraumatic.  Cardiovascular:     Rate and Rhythm: Normal rate and regular rhythm.     Pulses: Normal pulses.     Heart sounds: Normal heart sounds. No murmur heard.    No friction rub. No gallop.  Pulmonary:     Effort: Pulmonary effort  is normal. No respiratory distress.     Breath sounds: Normal breath sounds. No wheezing.  Abdominal:     General: Bowel sounds are normal. There is no distension.     Palpations: Abdomen is soft. There is no mass.     Tenderness: There is abdominal tenderness (Mild right lower quadrant). There is no guarding or rebound.     Hernia: No hernia is present.  Skin:    General: Skin is warm and dry.  Neurological:     Mental Status: She is alert and oriented to person, place, and time.  Psychiatric:        Mood and Affect: Mood normal.        Behavior: Behavior normal.        Thought Content: Thought content normal.        Judgment: Judgment normal.   Procedures performed this visit: None.  Return if symptoms worsen or fail to improve.  __________________________________ Thayer Ohm, DNP, APRN, FNP-BC Primary Care and Sports Medicine [facility] [place]

## 2022-11-24 ENCOUNTER — Telehealth: Payer: Self-pay | Admitting: *Deleted

## 2022-11-24 NOTE — Telephone Encounter (Signed)
Left patient a message that she needs to schedule an early GTT.

## 2022-11-29 ENCOUNTER — Other Ambulatory Visit: Payer: 59

## 2022-11-29 DIAGNOSIS — Z8759 Personal history of other complications of pregnancy, childbirth and the puerperium: Secondary | ICD-10-CM | POA: Diagnosis not present

## 2022-11-30 ENCOUNTER — Encounter: Payer: Self-pay | Admitting: Certified Nurse Midwife

## 2022-11-30 ENCOUNTER — Telehealth (INDEPENDENT_AMBULATORY_CARE_PROVIDER_SITE_OTHER): Payer: 59 | Admitting: Certified Nurse Midwife

## 2022-11-30 VITALS — BP 109/63 | HR 78 | Wt 221.0 lb

## 2022-11-30 DIAGNOSIS — Z8759 Personal history of other complications of pregnancy, childbirth and the puerperium: Secondary | ICD-10-CM

## 2022-11-30 DIAGNOSIS — Z3A16 16 weeks gestation of pregnancy: Secondary | ICD-10-CM

## 2022-11-30 DIAGNOSIS — Z3482 Encounter for supervision of other normal pregnancy, second trimester: Secondary | ICD-10-CM

## 2022-11-30 DIAGNOSIS — Z6841 Body Mass Index (BMI) 40.0 and over, adult: Secondary | ICD-10-CM

## 2022-11-30 HISTORY — DX: Personal history of other complications of pregnancy, childbirth and the puerperium: Z87.59

## 2022-11-30 LAB — GLUCOSE TOLERANCE, 2 HOURS W/ 1HR
Glucose, 1 hour: 147 mg/dL (ref 70–179)
Glucose, 2 hour: 117 mg/dL (ref 70–152)
Glucose, Fasting: 91 mg/dL (ref 70–91)

## 2022-11-30 NOTE — Progress Notes (Signed)
   OBSTETRICS PRENATAL VIRTUAL VISIT ENCOUNTER NOTE  Provider location: Center for [facility] [place]   Patient location: Home  I connected with Drema Dallas on 11/30/22 at  2:30 PM EDT by MyChart Video Encounter and verified that I am speaking with the correct person using two identifiers. I discussed the limitations, risks, security and privacy concerns of performing an evaluation and management service virtually and the availability of in person appointments. I also discussed with the patient that there may be a patient responsible charge related to this service. The patient expressed understanding and agreed to proceed. Subjective:  Erica Boyer is a 37 y.o. G4P1021 at [redacted]w[redacted]d being seen today for ongoing prenatal care.  She is currently monitored for the following issues for this low-risk pregnancy and has Asthma; Migraines; Anxiety with depression; Gastroesophageal reflux disease; History of vitamin D deficiency; Supervision of normal pregnancy; Pre-diabetes; Anemia; Obesity; History of gestational hypertension; and BMI 40.0-44.9, adult (HCC) on their problem list.  Patient reports no complaints.  Contractions: Not present. Vag. Bleeding: None.  Movement: Absent. Denies any leaking of fluid.   The following portions of the patient's history were reviewed and updated as appropriate: allergies, current medications, past family history, past medical history, past social history, past surgical history and problem list.   Objective:   Vitals:   11/30/22 1400  BP: 109/63  Pulse: 78  Weight: 221 lb (100.2 kg)    Fetal Status:     Movement: Absent     General:  Alert, oriented and cooperative. Patient is in no acute distress.  Respiratory: Normal respiratory effort, no problems with respiration noted  Mental Status: Normal mood and affect. Normal behavior. Normal judgment and thought content.  Rest of physical exam deferred due to type of encounter  Imaging: No  results found.  Assessment and Plan:  Pregnancy: G4P1021 at [redacted]w[redacted]d 1. History of gestational hypertension - bASA  2. [redacted] weeks gestation of pregnancy - anatomy US scheduled  3. BMI 40.0-44.9, adult (HCC)  4. Encounter for supervision of other normal pregnancy in second trimester  Preterm labor symptoms and general obstetric precautions including but not limited to vaginal bleeding, contractions, leaking of fluid and fetal movement were reviewed in detail with the patient. I discussed the assessment and treatment plan with the patient. The patient was provided an opportunity to ask questions and all were answered. The patient agreed with the plan and demonstrated an understanding of the instructions. The patient was advised to call back or seek an in-person office evaluation/go to [facility] at [facility] for any urgent or concerning symptoms. Please refer to After Visit Summary for other counseling recommendations.   I provided 7 minutes of face-to-face time during this encounter.  No follow-ups on file.  Future Appointments  Date Time Provider Department Center  12/20/2022  1:15 PM [facility] NURSE [facility]  12/20/2022  1:30 PM [facility] US3 [facility]    Donette Larry, CNM Center for [employer], [facility]

## 2022-12-12 ENCOUNTER — Encounter: Payer: Self-pay | Admitting: Medical-Surgical

## 2022-12-13 ENCOUNTER — Ambulatory Visit: Payer: 59 | Admitting: Physician Assistant

## 2022-12-13 ENCOUNTER — Encounter: Payer: Self-pay | Admitting: Physician Assistant

## 2022-12-13 VITALS — BP 123/78 | HR 92 | Temp 98.9°F | Ht 61.0 in | Wt 225.0 lb

## 2022-12-13 DIAGNOSIS — N3000 Acute cystitis without hematuria: Secondary | ICD-10-CM

## 2022-12-13 DIAGNOSIS — R399 Unspecified symptoms and signs involving the genitourinary system: Secondary | ICD-10-CM

## 2022-12-13 DIAGNOSIS — O09529 Supervision of elderly multigravida, unspecified trimester: Secondary | ICD-10-CM

## 2022-12-13 DIAGNOSIS — Z3A18 18 weeks gestation of pregnancy: Secondary | ICD-10-CM

## 2022-12-13 HISTORY — DX: Supervision of elderly multigravida, unspecified trimester: O09.529

## 2022-12-13 LAB — POCT URINALYSIS DIP (CLINITEK)
Bilirubin, UA: NEGATIVE
Blood, UA: NEGATIVE
Glucose, UA: NEGATIVE mg/dL
Ketones, POC UA: NEGATIVE mg/dL
Nitrite, UA: NEGATIVE
POC PROTEIN,UA: NEGATIVE
Spec Grav, UA: 1.015 (ref 1.010–1.025)
Urobilinogen, UA: 0.2 E.U./dL
pH, UA: 5.5 (ref 5.0–8.0)

## 2022-12-13 MED ORDER — AMOXICILLIN 875 MG PO TABS
875.0000 mg | ORAL_TABLET | Freq: Two times a day (BID) | ORAL | 0 refills | Status: DC
Start: 2022-12-13 — End: 2022-12-17

## 2022-12-13 NOTE — Patient Instructions (Signed)
Urinary Tract Infection, Adult  A urinary tract infection (UTI) is an infection of any part of the urinary tract. The urinary tract includes the kidneys, ureters, bladder, and urethra. These organs make, store, and get rid of urine in the body. An upper UTI affects the ureters and kidneys. A lower UTI affects the bladder and urethra. What are the causes? Most urinary tract infections are caused by bacteria in your genital area around your urethra, where urine leaves your body. These bacteria grow and cause inflammation of your urinary tract. What increases the risk? You are more likely to develop this condition if: You have a urinary catheter that stays in place. You are not able to control when you urinate or have a bowel movement (incontinence). You are female and you: Use a spermicide or diaphragm for birth control. Have low estrogen levels. Are pregnant. You have certain genes that increase your risk. You are sexually active. You take antibiotic medicines. You have a condition that causes your flow of urine to slow down, such as: An enlarged prostate, if you are female. Blockage in your urethra. A kidney stone. A nerve condition that affects your bladder control (neurogenic bladder). Not getting enough to drink, or not urinating often. You have certain medical conditions, such as: Diabetes. A weak disease-fighting system (immunesystem). Sickle cell disease. Gout. Spinal cord injury. What are the signs or symptoms? Symptoms of this condition include: Needing to urinate right away (urgency). Frequent urination. This may include small amounts of urine each time you urinate. Pain or burning with urination. Blood in the urine. Urine that smells bad or unusual. Trouble urinating. Cloudy urine. Vaginal discharge, if you are female. Pain in the abdomen or the lower back. You may also have: Vomiting or a decreased appetite. Confusion. Irritability or tiredness. A fever or  chills. Diarrhea. The first symptom in older adults may be confusion. In some cases, they may not have any symptoms until the infection has worsened. How is this diagnosed? This condition is diagnosed based on your medical history and a physical exam. You may also have other tests, including: Urine tests. Blood tests. Tests for STIs (sexually transmitted infections). If you have had more than one UTI, a cystoscopy or imaging studies may be done to determine the cause of the infections. How is this treated? Treatment for this condition includes: Antibiotic medicine. Over-the-counter medicines to treat discomfort. Drinking enough water to stay hydrated. If you have frequent infections or have other conditions such as a kidney stone, you may need to see a health care provider who specializes in the urinary tract (urologist). In rare cases, urinary tract infections can cause sepsis. Sepsis is a life-threatening condition that occurs when the body responds to an infection. Sepsis is treated in the hospital with IV antibiotics, fluids, and other medicines. Follow these instructions at home:  Medicines Take over-the-counter and prescription medicines only as told by your health care provider. If you were prescribed an antibiotic medicine, take it as told by your health care provider. Do not stop using the antibiotic even if you start to feel better. General instructions Make sure you: Empty your bladder often and completely. Do not hold urine for long periods of time. Empty your bladder after sex. Wipe from front to back after urinating or having a bowel movement if you are female. Use each tissue only one time when you wipe. Drink enough fluid to keep your urine pale yellow. Keep all follow-up visits. This is important. Contact a health   care provider if: Your symptoms do not get better after 1-2 days. Your symptoms go away and then return. Get help right away if: You have severe pain in  your back or your lower abdomen. You have a fever or chills. You have nausea or vomiting. Summary A urinary tract infection (UTI) is an infection of any part of the urinary tract, which includes the kidneys, ureters, bladder, and urethra. Most urinary tract infections are caused by bacteria in your genital area. Treatment for this condition often includes antibiotic medicines. If you were prescribed an antibiotic medicine, take it as told by your health care provider. Do not stop using the antibiotic even if you start to feel better. Keep all follow-up visits. This is important. This information is not intended to replace advice given to you by your health care provider. Make sure you discuss any questions you have with your health care provider. Document Revised: 01/06/2020 Document Reviewed: 01/11/2020 Elsevier Patient Education  2024 Elsevier Inc.  

## 2022-12-13 NOTE — Telephone Encounter (Signed)
Patient scheduled today at 2:20 pm with Tandy Gaw, PA.

## 2022-12-13 NOTE — Progress Notes (Unsigned)
Acute Office Visit  Subjective:     Patient ID: Erica Boyer, female    DOB: February 18, 1986, 37 y.o.   MRN: [id number]  Chief Complaint  Patient presents with   Urinary Frequency   Dysuria    HPI Patient is in today for UTI symptoms for the last few days. Pt denies any fever, chills, nausea, vomiting, vaginal bleeding. She is [redacted] weeks pregnant and baby is very active. Pt has not taken anything to make symptoms better. She has had UTI before but not recently.   .. Active Ambulatory Problems    Diagnosis Date Noted   Asthma 11/27/2003   Migraines 03/29/2006   Anxiety with depression 02/02/2021   Gastroesophageal reflux disease 11/13/2021   History of vitamin D deficiency 11/13/2021   Supervision of normal pregnancy 09/28/2022   Pre-diabetes 10/01/2022   Anemia 10/05/2022   Obesity 10/05/2022   History of gestational hypertension 11/30/2022   BMI 40.0-44.9, adult (HCC) 11/30/2022   AMA (advanced maternal age) multigravida 35+ 12/13/2022   Resolved Ambulatory Problems    Diagnosis Date Noted   Patellofemoral syndrome, left 03/09/2021   Left ankle sprain 03/09/2021   Seasonal allergies 11/13/2021   Left cervical radiculopathy 07/22/2022   Pregnant 09/13/2022   Past Medical History:  Diagnosis Date   Fructose intolerance    GERD (gastroesophageal reflux disease)    Lactose intolerance    Vaginal Pap smear, abnormal      ROS  See HPI.     Objective:    BP 123/78 (BP Location: Left Arm, Patient Position: Sitting, Cuff Size: Large)   Pulse 92   Temp 98.9 F (37.2 C) (Oral)   Ht 5\' 1"  (1.549 m)   Wt 225 lb (102.1 kg)   LMP 07/13/2022   SpO2 98%   BMI 42.51 kg/m  BP Readings from Last 3 Encounters:  12/13/22 123/78  11/30/22 109/63  11/17/22 107/72   Wt Readings from Last 3 Encounters:  12/13/22 225 lb (102.1 kg)  11/30/22 221 lb (100.2 kg)  11/17/22 221 lb 0.6 oz (100.3 kg)    .Marland Kitchen Results for orders placed or performed in visit on 12/13/22  POCT  URINALYSIS DIP (CLINITEK)  Result Value Ref Range   Color, UA yellow yellow   Clarity, UA clear clear   Glucose, UA negative negative mg/dL   Bilirubin, UA negative negative   Ketones, POC UA negative negative mg/dL   Spec Grav, UA 4.098 1.191 - 1.025   Blood, UA negative negative   pH, UA 5.5 5.0 - 8.0   POC PROTEIN,UA negative negative, trace   Urobilinogen, UA 0.2 0.2 or 1.0 E.U./dL   Nitrite, UA Negative Negative   Leukocytes, UA Small (1+) (A) Negative     Physical Exam Constitutional:      Appearance: Normal appearance. She is obese.  HENT:     Head: Normocephalic.  Cardiovascular:     Rate and Rhythm: Normal rate.  Pulmonary:     Effort: Pulmonary effort is normal.  Abdominal:     Comments: Suprapubic tenderness to palpation  Discomfort with palpation over right CVA  Neurological:     Mental Status: She is alert.  Psychiatric:        Mood and Affect: Mood normal.          Assessment & Plan:  Marland KitchenMarland KitchenChrista was seen today for urinary frequency and dysuria.  Diagnoses and all orders for this visit:  Acute cystitis without hematuria -     amoxicillin (AMOXIL)  875 MG tablet; Take 1 tablet (875 mg total) by mouth 2 (two) times daily.  UTI symptoms -     POCT URINALYSIS DIP (CLINITEK) -     Urine Culture -     amoxicillin (AMOXIL) 875 MG tablet; Take 1 tablet (875 mg total) by mouth 2 (two) times daily.  [redacted] weeks gestation of pregnancy -     amoxicillin (AMOXIL) 875 MG tablet; Take 1 tablet (875 mg total) by mouth 2 (two) times daily.   UA positive for leuks only Will culture Due to pregnancy will empirically treat for UTI with amoxil due to keflex allergy Vitals signs look great No fever No red flag symptoms of infection Stay hydrated and follow up as needed or any worsening symptoms Ok to take pyridium in pregnancy via epocrates   Tandy Gaw, PA-C

## 2022-12-14 ENCOUNTER — Encounter: Payer: Self-pay | Admitting: Physician Assistant

## 2022-12-15 ENCOUNTER — Encounter: Payer: Self-pay | Admitting: Physician Assistant

## 2022-12-15 LAB — URINE CULTURE
MICRO NUMBER:: 15147301
SPECIMEN QUALITY:: ADEQUATE

## 2022-12-15 NOTE — Progress Notes (Signed)
E.coli detected in culture. Augmentin should treat!

## 2022-12-17 MED ORDER — AMOXICILLIN-POT CLAVULANATE 500-125 MG PO TABS: 1.0000 | ORAL_TABLET | Freq: Two times a day (BID) | ORAL | 0 refills | Status: DC

## 2022-12-17 NOTE — Addendum Note (Signed)
Addended byChristen Butter on: 12/17/2022 05:16 PM   Modules accepted: Orders

## 2022-12-20 ENCOUNTER — Ambulatory Visit: Payer: 59 | Attending: Obstetrics and Gynecology

## 2022-12-20 ENCOUNTER — Other Ambulatory Visit: Payer: Self-pay | Admitting: *Deleted

## 2022-12-20 ENCOUNTER — Encounter: Payer: Self-pay | Admitting: *Deleted

## 2022-12-20 ENCOUNTER — Ambulatory Visit: Payer: 59 | Admitting: *Deleted

## 2022-12-20 VITALS — BP 117/62 | HR 97

## 2022-12-20 DIAGNOSIS — Z8759 Personal history of other complications of pregnancy, childbirth and the puerperium: Secondary | ICD-10-CM | POA: Insufficient documentation

## 2022-12-20 DIAGNOSIS — O99212 Obesity complicating pregnancy, second trimester: Secondary | ICD-10-CM | POA: Insufficient documentation

## 2022-12-20 DIAGNOSIS — J45909 Unspecified asthma, uncomplicated: Secondary | ICD-10-CM | POA: Insufficient documentation

## 2022-12-20 DIAGNOSIS — Z3A19 19 weeks gestation of pregnancy: Secondary | ICD-10-CM | POA: Insufficient documentation

## 2022-12-20 DIAGNOSIS — Z3490 Encounter for supervision of normal pregnancy, unspecified, unspecified trimester: Secondary | ICD-10-CM | POA: Diagnosis not present

## 2022-12-20 DIAGNOSIS — O09529 Supervision of elderly multigravida, unspecified trimester: Secondary | ICD-10-CM

## 2022-12-20 DIAGNOSIS — O09522 Supervision of elderly multigravida, second trimester: Secondary | ICD-10-CM

## 2022-12-20 DIAGNOSIS — R7303 Prediabetes: Secondary | ICD-10-CM | POA: Diagnosis not present

## 2022-12-20 DIAGNOSIS — O99891 Other specified diseases and conditions complicating pregnancy: Secondary | ICD-10-CM | POA: Diagnosis not present

## 2022-12-20 DIAGNOSIS — Z6841 Body Mass Index (BMI) 40.0 and over, adult: Secondary | ICD-10-CM

## 2022-12-20 DIAGNOSIS — O99512 Diseases of the respiratory system complicating pregnancy, second trimester: Secondary | ICD-10-CM | POA: Diagnosis not present

## 2022-12-20 DIAGNOSIS — Z362 Encounter for other antenatal screening follow-up: Secondary | ICD-10-CM

## 2022-12-31 ENCOUNTER — Telehealth: Payer: Self-pay | Admitting: *Deleted

## 2022-12-31 ENCOUNTER — Ambulatory Visit (INDEPENDENT_AMBULATORY_CARE_PROVIDER_SITE_OTHER): Payer: 59 | Admitting: Family Medicine

## 2022-12-31 VITALS — BP 117/78 | HR 112 | Wt 226.0 lb

## 2022-12-31 DIAGNOSIS — Z8759 Personal history of other complications of pregnancy, childbirth and the puerperium: Secondary | ICD-10-CM

## 2022-12-31 DIAGNOSIS — R7303 Prediabetes: Secondary | ICD-10-CM

## 2022-12-31 DIAGNOSIS — Z3480 Encounter for supervision of other normal pregnancy, unspecified trimester: Secondary | ICD-10-CM | POA: Diagnosis not present

## 2022-12-31 DIAGNOSIS — O09522 Supervision of elderly multigravida, second trimester: Secondary | ICD-10-CM

## 2022-12-31 DIAGNOSIS — Z3A20 20 weeks gestation of pregnancy: Secondary | ICD-10-CM

## 2022-12-31 NOTE — Telephone Encounter (Signed)
Left patient an urgent message to call the office to schedule Return in about 4 weeks (around 01/28/2023) for HROB follow up.

## 2022-12-31 NOTE — Progress Notes (Signed)
   PRENATAL VISIT NOTE  Subjective:  Erica Boyer is a 37 y.o. G4P1021 at [redacted]w[redacted]d being seen today for ongoing prenatal care.  She is currently monitored for the following issues for this high-risk pregnancy and has Asthma; Migraines; Anxiety with depression; Gastroesophageal reflux disease; History of vitamin D deficiency; Supervision of normal pregnancy; Pre-diabetes; Anemia; Obesity; History of gestational hypertension; BMI 40.0-44.9, adult (HCC); and AMA (advanced maternal age) multigravida 35+ on their problem list.  Patient reports no complaints.  Contractions: Not present. Vag. Bleeding: None.  Movement: Present. Denies leaking of fluid.   The following portions of the patient's history were reviewed and updated as appropriate: allergies, current medications, past family history, past medical history, past social history, past surgical history and problem list.   Objective:   Vitals:   12/31/22 0953  BP: 117/78  Pulse: (!) 112  Weight: 226 lb (102.5 kg)    Fetal Status: Fetal Heart Rate (bpm): 141 Fundal Height: 27 cm Movement: Present     General:  Alert, oriented and cooperative. Patient is in no acute distress.  Skin: Skin is warm and dry. No rash noted.   Cardiovascular: Normal heart rate noted  Respiratory: Normal respiratory effort, no problems with respiration noted  Abdomen: Soft, gravid, appropriate for gestational age.  Pain/Pressure: Absent     Pelvic: Cervical exam deferred        Extremities: Normal range of motion.  Edema: None  Mental Status: Normal mood and affect. Normal behavior. Normal judgment and thought content.   Assessment and Plan:  Pregnancy: G4P1021 at [redacted]w[redacted]d 1. Supervision of other normal pregnancy, antepartum - AFP, Serum, Open Spina Bifida  2. History of gestational hypertension BP wnl today  3. Pre-diabetes Anatomy US EFW 93% with suboptimal cardiac views. Fundal height 27cm. Scheduled for follow up growth.   4. Multigravida of advanced  maternal age in second trimester LR NIPS  5. [redacted] weeks gestation of pregnancy Anatomy US reviewed. Follow up in 4 weeks or sooner if needed  Preterm labor symptoms and general obstetric precautions including but not limited to vaginal bleeding, contractions, leaking of fluid and fetal movement were reviewed in detail with the patient. Please refer to After Visit Summary for other counseling recommendations.   Return in about 4 weeks (around 01/28/2023) for HROB follow up.  Future Appointments  Date Time Provider Department Center  01/24/2023  1:15 PM [facility] NURSE [facility]  01/24/2023  1:30 PM [facility] US2 [facility]    Huxley Vanwagoner Autry-Lott, DO

## 2023-01-02 LAB — AFP, SERUM, OPEN SPINA BIFIDA
AFP MoM: 1.04
AFP Value: 57 ng/mL
Gest. Age on Collection Date: 21.4 weeks
Maternal Age At EDD: 37.3 yr
OSBR Risk 1 IN: 10000
Test Results:: NEGATIVE
Weight: 226 [lb_av]

## 2023-01-06 ENCOUNTER — Ambulatory Visit: Payer: 59 | Admitting: Medical-Surgical

## 2023-01-06 ENCOUNTER — Encounter: Payer: Self-pay | Admitting: Medical-Surgical

## 2023-01-06 VITALS — BP 104/68 | HR 92 | Resp 20 | Ht 61.0 in | Wt 230.1 lb

## 2023-01-06 DIAGNOSIS — R102 Pelvic and perineal pain: Secondary | ICD-10-CM | POA: Diagnosis not present

## 2023-01-06 DIAGNOSIS — Z3A21 21 weeks gestation of pregnancy: Secondary | ICD-10-CM | POA: Diagnosis not present

## 2023-01-06 DIAGNOSIS — O26892 Other specified pregnancy related conditions, second trimester: Secondary | ICD-10-CM

## 2023-01-06 NOTE — Progress Notes (Signed)
        Established patient visit  History, exam, impression, and plan:  1. Pelvic pain affecting pregnancy in second trimester, antepartum Pleasant 37 year old female presenting today with complaints of severe pelvic pain.  She is currently 21 weeks present and reached out to her OB/GYN office regarding her symptoms.  They did message her back and told her this could be gas, uterine enlargement, or renal movement pain.  Reports the pain has gradually worsened and is now constant and interfering with most of her daily activities.  The pain is located at the pubic bone and is described as sharp and stabbing.  Reports that it feels like her pelvis is going to break apart.  Previously noted with lying down and position changes however now is affecting her ability to sit, lie down, walk, and stand.  She is already on light duty at work but it is now extremely difficult to get through the day because of her current level of pain.  Has not taken any Tylenol although this is safe for pregnancy.  She has tried ice and heat to the area but only temporary/minimal improvement.  Denies fever, chills, urinary symptoms, abdominal pain, contractions, and recent injury.  Denies rupture of membranes and vaginal bleeding.  Feels regular fetal movements.  Having regular bowel movements.  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  Recommended conservative measures to use during pregnancy for management of this.  Okay to use Tylenol as needed.  Continue heat/ice for comfort.  Recommended a supportive belly band to help reduce pressure.  Make sure to use supportive pillows between the knees and under the belly when side-lying.  Has been spending a lot of time laying down due to discomfort.  Advised that this may actually worsen symptoms rather than help.  Would like for her to do some regular intentional movement throughout the day.  Discussed possible physical therapy but her schedule makes this  tricky.  I reached out to our physical therapists to see if they have any resources for home exercises that she can do to help with her symptoms.  Once I have these available, I will certainly send them to her through MyChart.  Procedures performed this visit: None.  Return if symptoms worsen or fail to improve.  __________________________________ Thayer Ohm, DNP, APRN, FNP-BC Primary Care and Sports Medicine [facility]

## 2023-01-06 NOTE — Telephone Encounter (Signed)
Patient scheduled.

## 2023-01-24 ENCOUNTER — Ambulatory Visit: Payer: 59 | Admitting: *Deleted

## 2023-01-24 ENCOUNTER — Encounter: Payer: Self-pay | Admitting: *Deleted

## 2023-01-24 ENCOUNTER — Ambulatory Visit: Payer: 59

## 2023-01-24 ENCOUNTER — Other Ambulatory Visit: Payer: Self-pay | Admitting: *Deleted

## 2023-01-24 VITALS — BP 119/56 | HR 98

## 2023-01-24 DIAGNOSIS — Z362 Encounter for other antenatal screening follow-up: Secondary | ICD-10-CM | POA: Diagnosis not present

## 2023-01-24 DIAGNOSIS — E1169 Type 2 diabetes mellitus with other specified complication: Secondary | ICD-10-CM | POA: Diagnosis not present

## 2023-01-24 DIAGNOSIS — O99512 Diseases of the respiratory system complicating pregnancy, second trimester: Secondary | ICD-10-CM | POA: Diagnosis not present

## 2023-01-24 DIAGNOSIS — O24112 Pre-existing diabetes mellitus, type 2, in pregnancy, second trimester: Secondary | ICD-10-CM

## 2023-01-24 DIAGNOSIS — O09523 Supervision of elderly multigravida, third trimester: Secondary | ICD-10-CM

## 2023-01-24 DIAGNOSIS — D649 Anemia, unspecified: Secondary | ICD-10-CM | POA: Diagnosis not present

## 2023-01-24 DIAGNOSIS — O09522 Supervision of elderly multigravida, second trimester: Secondary | ICD-10-CM | POA: Insufficient documentation

## 2023-01-24 DIAGNOSIS — O99012 Anemia complicating pregnancy, second trimester: Secondary | ICD-10-CM

## 2023-01-24 DIAGNOSIS — J45909 Unspecified asthma, uncomplicated: Secondary | ICD-10-CM

## 2023-01-24 DIAGNOSIS — E669 Obesity, unspecified: Secondary | ICD-10-CM | POA: Diagnosis not present

## 2023-01-24 DIAGNOSIS — O99212 Obesity complicating pregnancy, second trimester: Secondary | ICD-10-CM | POA: Diagnosis not present

## 2023-01-24 DIAGNOSIS — O99213 Obesity complicating pregnancy, third trimester: Secondary | ICD-10-CM

## 2023-01-24 DIAGNOSIS — Z6841 Body Mass Index (BMI) 40.0 and over, adult: Secondary | ICD-10-CM

## 2023-01-24 DIAGNOSIS — Z3A24 24 weeks gestation of pregnancy: Secondary | ICD-10-CM

## 2023-01-24 DIAGNOSIS — Z8759 Personal history of other complications of pregnancy, childbirth and the puerperium: Secondary | ICD-10-CM

## 2023-01-26 ENCOUNTER — Ambulatory Visit (INDEPENDENT_AMBULATORY_CARE_PROVIDER_SITE_OTHER): Payer: 59 | Admitting: Obstetrics and Gynecology

## 2023-01-26 ENCOUNTER — Encounter: Payer: Self-pay | Admitting: Obstetrics and Gynecology

## 2023-01-26 VITALS — BP 113/75 | HR 99 | Wt 227.0 lb

## 2023-01-26 DIAGNOSIS — Z6841 Body Mass Index (BMI) 40.0 and over, adult: Secondary | ICD-10-CM

## 2023-01-26 DIAGNOSIS — Z3A24 24 weeks gestation of pregnancy: Secondary | ICD-10-CM

## 2023-01-26 DIAGNOSIS — Z3482 Encounter for supervision of other normal pregnancy, second trimester: Secondary | ICD-10-CM

## 2023-01-26 DIAGNOSIS — O09529 Supervision of elderly multigravida, unspecified trimester: Secondary | ICD-10-CM

## 2023-01-26 NOTE — Progress Notes (Signed)
   PRENATAL VISIT NOTE  Subjective:  Erica Boyer is a 37 y.o. G4P1021 at [redacted]w[redacted]d being seen today for ongoing prenatal care.  She is currently monitored for the following issues for this high-risk pregnancy and has Asthma; Migraines; Anxiety with depression; Gastroesophageal reflux disease; History of vitamin D deficiency; Supervision of normal pregnancy; Pre-diabetes; Anemia; Obesity; History of gestational hypertension; BMI 40.0-44.9, adult (HCC); and AMA (advanced maternal age) multigravida 35+ on their problem list.  Patient reports  occasional lightening in pelvis improved with belly band .  Contractions: Not present. Vag. Bleeding: None.  Movement: Present. Denies leaking of fluid.   The following portions of the patient's history were reviewed and updated as appropriate: allergies, current medications, past family history, past medical history, past social history, past surgical history and problem list.   Objective:   Vitals:   01/26/23 1357  BP: 113/75  Pulse: 99  Weight: 103 kg    Fetal Status: Fetal Heart Rate (bpm): 156   Movement: Present     General:  Alert, oriented and cooperative. Patient is in no acute distress.  Skin: Skin is warm and dry. No rash noted.   Cardiovascular: Normal heart rate noted  Respiratory: Normal respiratory effort, no problems with respiration noted  Abdomen: Soft, gravid, appropriate for gestational age.  Pain/Pressure: Present     Pelvic: Cervical exam deferred        Extremities: Normal range of motion.  Edema: None  Mental Status: Normal mood and affect. Normal behavior. Normal judgment and thought content.   Assessment and Plan:  Pregnancy: G4P1021 at [redacted]w[redacted]d  1. Encounter for supervision of other normal pregnancy in second trimester  2. BMI 40.0-44.9, adult (HCC) Weekly testing starting 34 weeks  3. Antepartum multigravida of advanced maternal age  30. [redacted] weeks gestation of pregnancy   Preterm labor symptoms and general  obstetric precautions including but not limited to vaginal bleeding, contractions, leaking of fluid and fetal movement were reviewed in detail with the patient. Please refer to After Visit Summary for other counseling recommendations.   Return in about 4 weeks (around 02/23/2023) for 2 hr GTT, 3rd trim labs.  Future Appointments  Date Time Provider Department Center  02/21/2023  1:15 PM [facility] NURSE [facility]  02/21/2023  1:30 PM [facility] US1 [facility]    Conan Bowens, MD

## 2023-01-26 NOTE — Patient Instructions (Signed)
This website provides an overview of breastfeeding: [email or web address]

## 2023-02-16 ENCOUNTER — Encounter: Payer: Self-pay | Admitting: *Deleted

## 2023-02-21 ENCOUNTER — Encounter: Payer: Self-pay | Admitting: *Deleted

## 2023-02-21 ENCOUNTER — Ambulatory Visit: Payer: 59 | Admitting: *Deleted

## 2023-02-21 ENCOUNTER — Ambulatory Visit: Payer: 59 | Attending: Obstetrics

## 2023-02-21 ENCOUNTER — Other Ambulatory Visit: Payer: Self-pay | Admitting: *Deleted

## 2023-02-21 VITALS — BP 113/65 | HR 88

## 2023-02-21 DIAGNOSIS — Z3A28 28 weeks gestation of pregnancy: Secondary | ICD-10-CM

## 2023-02-21 DIAGNOSIS — D649 Anemia, unspecified: Secondary | ICD-10-CM

## 2023-02-21 DIAGNOSIS — Z6841 Body Mass Index (BMI) 40.0 and over, adult: Secondary | ICD-10-CM | POA: Diagnosis not present

## 2023-02-21 DIAGNOSIS — O99513 Diseases of the respiratory system complicating pregnancy, third trimester: Secondary | ICD-10-CM | POA: Diagnosis not present

## 2023-02-21 DIAGNOSIS — O09523 Supervision of elderly multigravida, third trimester: Secondary | ICD-10-CM | POA: Insufficient documentation

## 2023-02-21 DIAGNOSIS — Z8759 Personal history of other complications of pregnancy, childbirth and the puerperium: Secondary | ICD-10-CM

## 2023-02-21 DIAGNOSIS — O99213 Obesity complicating pregnancy, third trimester: Secondary | ICD-10-CM | POA: Insufficient documentation

## 2023-02-21 DIAGNOSIS — E669 Obesity, unspecified: Secondary | ICD-10-CM

## 2023-02-21 DIAGNOSIS — O403XX0 Polyhydramnios, third trimester, not applicable or unspecified: Secondary | ICD-10-CM | POA: Diagnosis not present

## 2023-02-21 DIAGNOSIS — J45909 Unspecified asthma, uncomplicated: Secondary | ICD-10-CM

## 2023-02-21 DIAGNOSIS — O99810 Abnormal glucose complicating pregnancy: Secondary | ICD-10-CM

## 2023-02-21 DIAGNOSIS — O99013 Anemia complicating pregnancy, third trimester: Secondary | ICD-10-CM

## 2023-02-22 NOTE — Progress Notes (Deleted)
   PRENATAL VISIT NOTE  Subjective:  Erica Boyer is a 37 y.o. G4P1021 at [redacted]w[redacted]d being seen today for ongoing prenatal care.  She is currently monitored for the following issues for this low-risk pregnancy and has Asthma; Supervision of normal pregnancy; Pre-diabetes; Anemia; Obesity; History of gestational hypertension; BMI 40.0-44.9, adult (HCC); and AMA (advanced maternal age) multigravida 35+ on their problem list.  Patient reports {[id number]}.   .  .   . Denies leaking of fluid.   The following portions of the patient's history were reviewed and updated as appropriate: allergies, current medications, past family history, past medical history, past social history, past surgical history and problem list.   Objective:  There were no vitals filed for this visit.  Fetal Status:           General:  Alert, oriented and cooperative. Patient is in no acute distress.  Skin: Skin is warm and dry. No rash noted.   Cardiovascular: Normal heart rate noted  Respiratory: Normal respiratory effort, no problems with respiration noted  Abdomen: Soft, gravid, appropriate for gestational age.        Pelvic: Cervical exam deferred        Extremities: Normal range of motion.     Mental Status: Normal mood and affect. Normal behavior. Normal judgment and thought content.   Assessment and Plan:  Pregnancy: G4P1021 at [redacted]w[redacted]d 1. History of gestational hypertension Continue ldasa Bp today ***  2. BMI 40.0-44.9, adult (HCC) Growth on 9/9 was 92%ile, c/w LGA. HC 32, AC 30w. AFI 25.  Antenatal testing at 34w.   3. Antepartum multigravida of advanced maternal age  66. Encounter for supervision of other normal pregnancy in third trimester Offered tdap and flu shot - pt *** 28w labs today Rh pos  5. Pregnancy with 28 completed weeks gestation   Preterm labor symptoms and general obstetric precautions including but not limited to vaginal bleeding, contractions, leaking of fluid and fetal movement were  reviewed in detail with the patient. Please refer to After Visit Summary for other counseling recommendations.   No follow-ups on file.  Future Appointments  Date Time Provider Department Center  02/23/2023  8:30 AM Milas Hock, MD [facility]  03/04/2023  2:15 PM Louann Sjogren, DPM [facility] None  03/21/2023  3:30 PM [facility] US4 [facility]    Milas Hock, MD

## 2023-02-23 ENCOUNTER — Encounter: Payer: 59 | Admitting: Obstetrics and Gynecology

## 2023-02-23 ENCOUNTER — Telehealth: Payer: Self-pay | Admitting: *Deleted

## 2023-02-23 DIAGNOSIS — Z3490 Encounter for supervision of normal pregnancy, unspecified, unspecified trimester: Secondary | ICD-10-CM | POA: Diagnosis not present

## 2023-02-23 DIAGNOSIS — Z8759 Personal history of other complications of pregnancy, childbirth and the puerperium: Secondary | ICD-10-CM

## 2023-02-23 DIAGNOSIS — O09529 Supervision of elderly multigravida, unspecified trimester: Secondary | ICD-10-CM

## 2023-02-23 DIAGNOSIS — Z3A28 28 weeks gestation of pregnancy: Secondary | ICD-10-CM

## 2023-02-23 DIAGNOSIS — Z3483 Encounter for supervision of other normal pregnancy, third trimester: Secondary | ICD-10-CM

## 2023-02-23 DIAGNOSIS — R7303 Prediabetes: Secondary | ICD-10-CM | POA: Diagnosis not present

## 2023-02-23 DIAGNOSIS — Z6841 Body Mass Index (BMI) 40.0 and over, adult: Secondary | ICD-10-CM

## 2023-02-23 NOTE — Telephone Encounter (Signed)
Left patient an urgent message to call and reschedule missed OB appointment with 2 hour glucose.

## 2023-02-25 ENCOUNTER — Telehealth (INDEPENDENT_AMBULATORY_CARE_PROVIDER_SITE_OTHER): Payer: 59 | Admitting: Obstetrics and Gynecology

## 2023-02-25 VITALS — BP 113/65 | HR 88

## 2023-02-25 DIAGNOSIS — O409XX0 Polyhydramnios, unspecified trimester, not applicable or unspecified: Secondary | ICD-10-CM

## 2023-02-25 DIAGNOSIS — O3660X0 Maternal care for excessive fetal growth, unspecified trimester, not applicable or unspecified: Secondary | ICD-10-CM | POA: Insufficient documentation

## 2023-02-25 DIAGNOSIS — O3663X1 Maternal care for excessive fetal growth, third trimester, fetus 1: Secondary | ICD-10-CM

## 2023-02-25 DIAGNOSIS — Z3A28 28 weeks gestation of pregnancy: Secondary | ICD-10-CM

## 2023-02-25 DIAGNOSIS — Z3483 Encounter for supervision of other normal pregnancy, third trimester: Secondary | ICD-10-CM

## 2023-02-25 HISTORY — DX: Maternal care for excessive fetal growth, unspecified trimester, not applicable or unspecified: O36.60X0

## 2023-02-25 HISTORY — DX: Polyhydramnios, unspecified trimester, not applicable or unspecified: O40.9XX0

## 2023-02-25 NOTE — Progress Notes (Signed)
    TELEHEALTH OBSTETRICS VISIT ENCOUNTER NOTE  Provider location: Center for [facility] [place]   Patient location: Home  I connected with Erica Boyer on 02/25/23 at 10:10 AM EDT by telephone at home and verified that I am speaking with the correct person using two identifiers. Of note, unable to do video encounter due to technical difficulties.    I discussed the limitations, risks, security and privacy concerns of performing an evaluation and management service by telephone and the availability of in person appointments. I also discussed with the patient that there may be a patient responsible charge related to this service. The patient expressed understanding and agreed to proceed.  Subjective:  Erica Boyer is a 37 y.o. G4P1021 at [redacted]w[redacted]d being followed for ongoing prenatal care.  She is currently monitored for the following issues for this high-risk pregnancy and has Asthma; Supervision of normal pregnancy; Pre-diabetes; Anemia; Obesity; History of gestational hypertension; BMI 40.0-44.9, adult (HCC); AMA (advanced maternal age) multigravida 35+; Polyhydramnios affecting pregnancy; and Large for gestational age fetus affecting management of mother on their problem list.  Patient reports no complaints. Reports fetal movement. Denies any contractions, bleeding or leaking of fluid.   The following portions of the patient's history were reviewed and updated as appropriate: allergies, current medications, past family history, past medical history, past social history, past surgical history and problem list.   Objective:  Blood pressure 113/65, pulse 88, last menstrual period 07/13/2022. General:  Alert, oriented and cooperative.   Mental Status: Normal mood and affect perceived. Normal judgment and thought content.  Rest of physical exam deferred due to type of encounter  Assessment and Plan:  Pregnancy: G4P1021 at [redacted]w[redacted]d  1. Encounter for supervision of other  normal pregnancy in third trimester  - Doing well - some concerns about EFW - No BP today.    2. Polyhydramnios affecting pregnancy  - Feeling good fetal movement - AFI 25.1 - BPP's starting at 32 weeks with MFM  - Growth [place] Q4 weeks.   3. Excessive fetal growth affecting management of pregnancy in third trimester, fetus 1 of multiple gestation  -  EFW 1431 gram, 92% - Passed early 2 hour GTT and 2 hour GTT @ 28 weeks.  - A1c early pregnancy 5.7%   Preterm labor symptoms and general obstetric precautions including but not limited to vaginal bleeding, contractions, leaking of fluid and fetal movement were reviewed in detail with the patient.  I discussed the assessment and treatment plan with the patient. The patient was provided an opportunity to ask questions and all were answered. The patient agreed with the plan and demonstrated an understanding of the instructions. The patient was advised to call back or seek an in-person office evaluation/go to MAU at [facility] for any urgent or concerning symptoms. Please refer to After Visit Summary for other counseling recommendations.   I provided 12 minutes of non-face-to-face time during this encounter.  No follow-ups on file.  Future Appointments  Date Time Provider Department Center  02/25/2023 10:10 AM Noralyn Karim, Harolyn Rutherford, NP [facility]  03/04/2023  2:15 PM Louann Sjogren, DPM [facility] None  03/21/2023  3:30 PM [facility] US4 [facility]    Venia Carbon, NP Center for [employer], [facility]

## 2023-02-28 ENCOUNTER — Encounter: Payer: Self-pay | Admitting: Family Medicine

## 2023-02-28 ENCOUNTER — Encounter: Payer: Self-pay | Admitting: Medical-Surgical

## 2023-02-28 ENCOUNTER — Ambulatory Visit: Payer: 59 | Admitting: Family Medicine

## 2023-02-28 VITALS — BP 107/73 | HR 86 | Temp 97.7°F | Ht 61.0 in | Wt 229.0 lb

## 2023-02-28 DIAGNOSIS — J3489 Other specified disorders of nose and nasal sinuses: Secondary | ICD-10-CM

## 2023-02-28 DIAGNOSIS — J0140 Acute pansinusitis, unspecified: Secondary | ICD-10-CM | POA: Insufficient documentation

## 2023-02-28 DIAGNOSIS — Z20822 Contact with and (suspected) exposure to covid-19: Secondary | ICD-10-CM | POA: Diagnosis not present

## 2023-02-28 LAB — POCT INFLUENZA A/B
Influenza A, POC: NEGATIVE
Influenza B, POC: NEGATIVE

## 2023-02-28 LAB — POC COVID19 BINAXNOW: SARS Coronavirus 2 Ag: NEGATIVE

## 2023-02-28 MED ORDER — AMOXICILLIN-POT CLAVULANATE 875-125 MG PO TABS: 1.0000 | ORAL_TABLET | Freq: Two times a day (BID) | ORAL | 0 refills | Status: DC

## 2023-02-28 NOTE — Assessment & Plan Note (Signed)
Influenza and COVID tests are negative.  Adding course of augmentin for treatment of bacterial sinusitis. Continue supportive care at home.

## 2023-02-28 NOTE — Progress Notes (Signed)
Erica Boyer - 37 y.o. female MRN [id number]  Date of birth: 07/14/1985  Subjective Chief Complaint  Patient presents with   Sinus Problem   Headache    HPI Erica Boyer is a 37 y.o. female here today with complaint of sinus pressure and congestion, headache, fatigue. Denies respiratory symptoms.  She has not noted fever or chills. No GI symptoms.  Symptoms started about 2.5 days ago. She is [redacted] weeks pregnant.  She has tried increased fluids, humidifier and tylenol.   ROS:  A comprehensive ROS was completed and negative except as noted per HPI  Allergies  Allergen Reactions   Cephalexin Hives, Itching and Shortness Of Breath   Mometasone Other (See Comments)    Blurred vision   Moxifloxacin Rash    dizziness   Sumatriptan Photosensitivity    Worsens migraine    Mometasone Furoate Other (See Comments)   Sumatriptan-Naproxen Sodium Photosensitivity    Past Medical History:  Diagnosis Date   Anxiety with depression 02/02/2021   Asthma    Fructose intolerance    Gastroesophageal reflux disease 11/13/2021   GERD (gastroesophageal reflux disease)    History of vitamin D deficiency 11/13/2021   Lactose intolerance    Migraines 03/29/2006   Supervision of normal pregnancy 09/28/2022              NURSING     PROVIDER      Office Location    [facility]    Dating by    LMP c/w U/S at 11 wks      [facility] Model    Traditional    Anatomy U/S           Initiated care at     [employer]     English                     LAB RESULTS       Support Person    Doug    Genetics    NIPS: low risk        AFP:                            NT/IT (FT only)                     Vaginal Pap smear, abnormal     Past Surgical History:  Procedure Laterality Date   NO PAST SURGERIES      Social History   Socioeconomic History   Marital status: Married    Spouse name: Not on file   Number of children: Not on file   Years of education: Not on file   Highest  education level: Bachelor's degree (e.g., BA, AB, BS)  Occupational History   Not on file  Tobacco Use   Smoking status: Never    Passive exposure: Never   Smokeless tobacco: Never  Vaping Use   Vaping status: Never Used  Substance and Sexual Activity   Alcohol use: Yes    Comment: socially   Drug use: Never   Sexual activity: Yes    Partners: Male    Birth control/protection: None  Other Topics Concern   Not on file  Social History Narrative   Not on file   Social Determinants of Health  Financial Resource Strain: Low Risk  (09/13/2022)   Overall Financial Resource Strain (CARDIA)    Difficulty of Paying Living Expenses: Not hard at all  Food Insecurity: No Food Insecurity (09/13/2022)   Hunger Vital Sign    Worried About Running Out of Food in the Last Year: Never true    Ran Out of Food in the Last Year: Never true  Transportation Needs: No Transportation Needs (09/13/2022)   PRAPARE - Administrator, Civil Service (Medical): No    Lack of Transportation (Non-Medical): No  Physical Activity: Unknown (09/13/2022)   Exercise Vital Sign    Days of Exercise per Week: 0 days    Minutes of Exercise per Session: Not on file  Stress: No Stress Concern Present (09/13/2022)   [employer] of Occupational Health - Occupational Stress Questionnaire    Feeling of Stress : Not at all  Social Connections: Unknown (09/13/2022)   Social Connection and Isolation Panel [NHANES]    Frequency of Communication with Friends and Family: Twice a week    Frequency of Social Gatherings with Friends and Family: Twice a week    Attends Religious Services: 1 to 4 times per year    Active Member of Clubs or Organizations: Patient declined    Attends Engineer, structural: Not on file    Marital Status: Married    Family History  Problem Relation Age of Onset   Hypertension Mother    Sleep apnea Mother    Hyperlipidemia Father    Heart failure Father    Esophageal cancer  Father    Throat cancer Father    Diabetes Maternal Grandmother    Heart failure Paternal Grandfather     Health Maintenance  Topic Date Due   DTaP/Tdap/Td (1 - Tdap) Never done   INFLUENZA VACCINE  01/13/2023   COVID-19 Vaccine (4 - 2023-24 season) 02/13/2023   Cervical Cancer Screening (HPV/Pap Cotest)  04/16/2025   Hepatitis C Screening  Completed   HIV Screening  Completed   HPV VACCINES  Aged Out     ----------------------------------------------------------------------------------------------------------------------------------------------------------------------------------------------------------------- Physical Exam BP 107/73 (BP Location: Left Arm, Patient Position: Sitting, Cuff Size: Large)   Pulse 86   Temp 97.7 F (36.5 C) (Oral)   Ht 5\' 1"  (1.549 m)   Wt 229 lb (103.9 kg)   LMP 07/13/2022   SpO2 100%   BMI 43.27 kg/m   Physical Exam Constitutional:      Appearance: Normal appearance.  HENT:     Head: Normocephalic and atraumatic.  Eyes:     General: No scleral icterus. Cardiovascular:     Rate and Rhythm: Normal rate and regular rhythm.  Pulmonary:     Effort: Pulmonary effort is normal.     Breath sounds: Normal breath sounds.  Musculoskeletal:     Cervical back: Neck supple.  Neurological:     Mental Status: She is alert.     ------------------------------------------------------------------------------------------------------------------------------------------------------------------------------------------------------------------- Assessment and Plan  Acute pansinusitis Influenza and COVID tests are negative.  Adding course of augmentin for treatment of bacterial sinusitis. Continue supportive care at home.    Meds ordered this encounter  Medications   amoxicillin-clavulanate (AUGMENTIN) 875-125 MG tablet    Sig: Take 1 tablet by mouth 2 (two) times daily.    Dispense:  20 tablet    Refill:  0    No follow-ups on file.    This  visit occurred during the SARS-CoV-2 public health emergency.  Safety protocols were in place, including screening  questions prior to the visit, additional usage of staff PPE, and extensive cleaning of exam room while observing appropriate contact time as indicated for disinfecting solutions.

## 2023-03-04 ENCOUNTER — Ambulatory Visit: Payer: 59 | Admitting: Podiatry

## 2023-03-08 ENCOUNTER — Ambulatory Visit: Payer: 59 | Admitting: Obstetrics and Gynecology

## 2023-03-08 VITALS — BP 121/82 | HR 112 | Wt 228.0 lb

## 2023-03-08 DIAGNOSIS — O409XX0 Polyhydramnios, unspecified trimester, not applicable or unspecified: Secondary | ICD-10-CM

## 2023-03-08 DIAGNOSIS — Z3483 Encounter for supervision of other normal pregnancy, third trimester: Secondary | ICD-10-CM

## 2023-03-08 DIAGNOSIS — Z8759 Personal history of other complications of pregnancy, childbirth and the puerperium: Secondary | ICD-10-CM

## 2023-03-08 DIAGNOSIS — O3663X1 Maternal care for excessive fetal growth, third trimester, fetus 1: Secondary | ICD-10-CM

## 2023-03-08 DIAGNOSIS — Z23 Encounter for immunization: Secondary | ICD-10-CM | POA: Diagnosis not present

## 2023-03-08 DIAGNOSIS — Z3A30 30 weeks gestation of pregnancy: Secondary | ICD-10-CM

## 2023-03-08 NOTE — Progress Notes (Signed)
   PRENATAL VISIT NOTE  Subjective:  Erica Boyer is a 37 y.o. G4P1021 at [redacted]w[redacted]d being seen today for ongoing prenatal care.  She is currently monitored for the following issues for this high-risk pregnancy and has Asthma; Supervision of normal pregnancy; Pre-diabetes; Anemia; Obesity; History of gestational hypertension; BMI 40.0-44.9, adult (HCC); AMA (advanced maternal age) multigravida 35+; Polyhydramnios affecting pregnancy; Large for gestational age fetus affecting management of mother; and Acute pansinusitis on their problem list.  Patient reports no complaints.  Contractions: Not present. Vag. Bleeding: None.  Movement: Present. Denies leaking of fluid.   The following portions of the patient's history were reviewed and updated as appropriate: allergies, current medications, past family history, past medical history, past social history, past surgical history and problem list.   Objective:   Vitals:   03/08/23 1053  BP: 121/82  Pulse: (!) 112  Weight: 228 lb (103.4 kg)    Fetal Status: Fetal Heart Rate (bpm): 145   Movement: Present     General:  Alert, oriented and cooperative. Patient is in no acute distress.  Skin: Skin is warm and dry. No rash noted.   Cardiovascular: Normal heart rate noted  Respiratory: Normal respiratory effort, no problems with respiration noted  Abdomen: Soft, gravid, appropriate for gestational age.  Pain/Pressure: Absent     Pelvic: Cervical exam deferred        Extremities: Normal range of motion.  Edema: None  Mental Status: Normal mood and affect. Normal behavior. Normal judgment and thought content.   Assessment and Plan:  Pregnancy: G4P1021 at [redacted]w[redacted]d 1. Encounter for supervision of other normal pregnancy in third trimester BP and FHR normal  Feeling regular fetal movement - Tdap vaccine greater than or equal to 7yo IM  2. Polyhydramnios affecting pregnancy AFI 25.1, SDP 8.59  3. Excessive fetal growth affecting management of  pregnancy in third trimester, fetus 1 of multiple gestation EFW 92%, AC 96% Serial growths and weekly antenatal testing   4. History of gestational hypertension Normotensive, on ASA  5. [redacted] weeks gestation of pregnancy   Preterm labor symptoms and general obstetric precautions including but not limited to vaginal bleeding, contractions, leaking of fluid and fetal movement were reviewed in detail with the patient. Please refer to After Visit Summary for other counseling recommendations.   Return in two weeks for routine prenatal   Future Appointments  Date Time Provider Department Center  03/21/2023  3:30 PM [facility] US4 [facility]  03/22/2023 11:10 AM Rasch, Harolyn Rutherford, NP [facility]  04/05/2023  9:10 AM Rasch, Harolyn Rutherford, NP [facility]    Albertine Grates, FNP

## 2023-03-11 IMAGING — DX DG KNEE COMPLETE 4+V*L*
4 series · 4 of 4 positions shown · non-contrast
Comparison: No recent.

CLINICAL DATA: Left knee pain.

EXAM:
LEFT KNEE - COMPLETE 4+ VIEW

[knee tunnel]
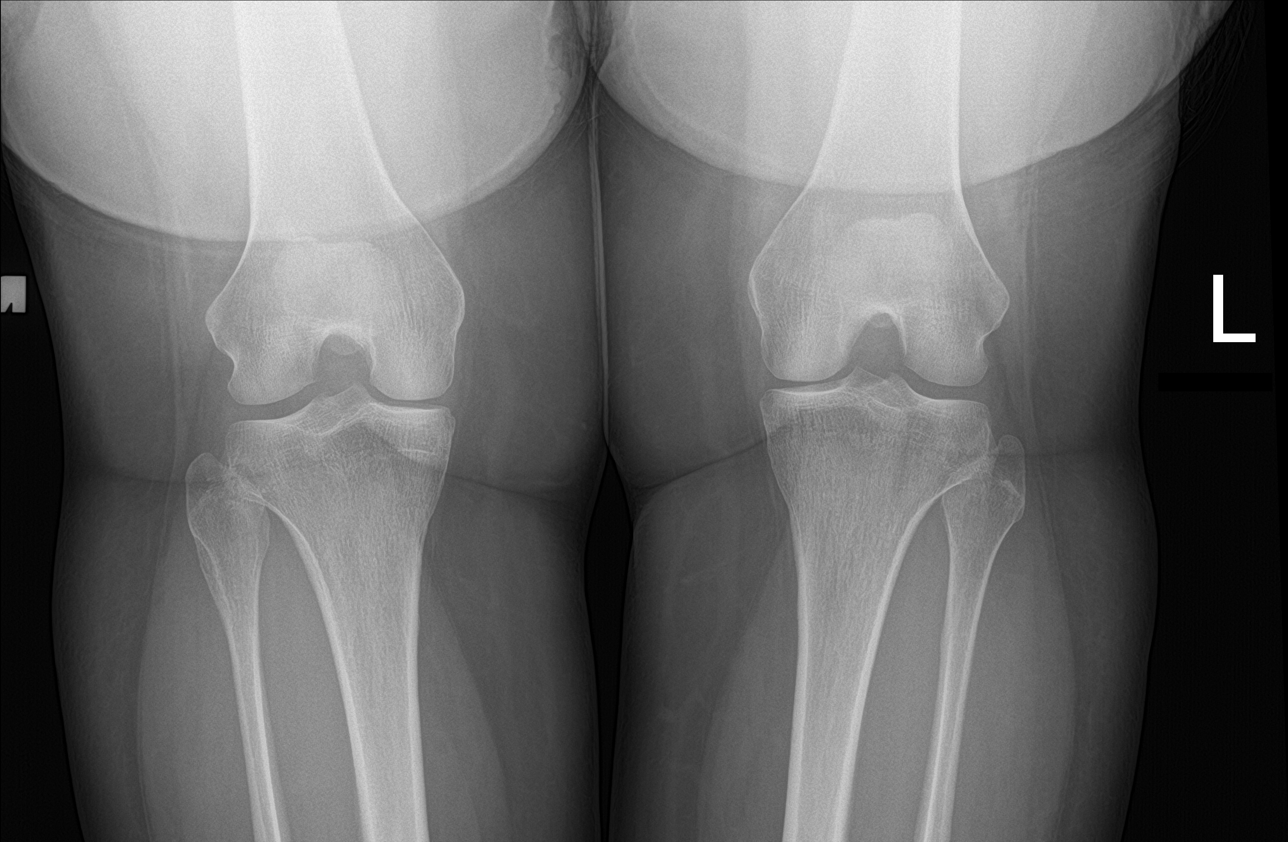

[knee lat]
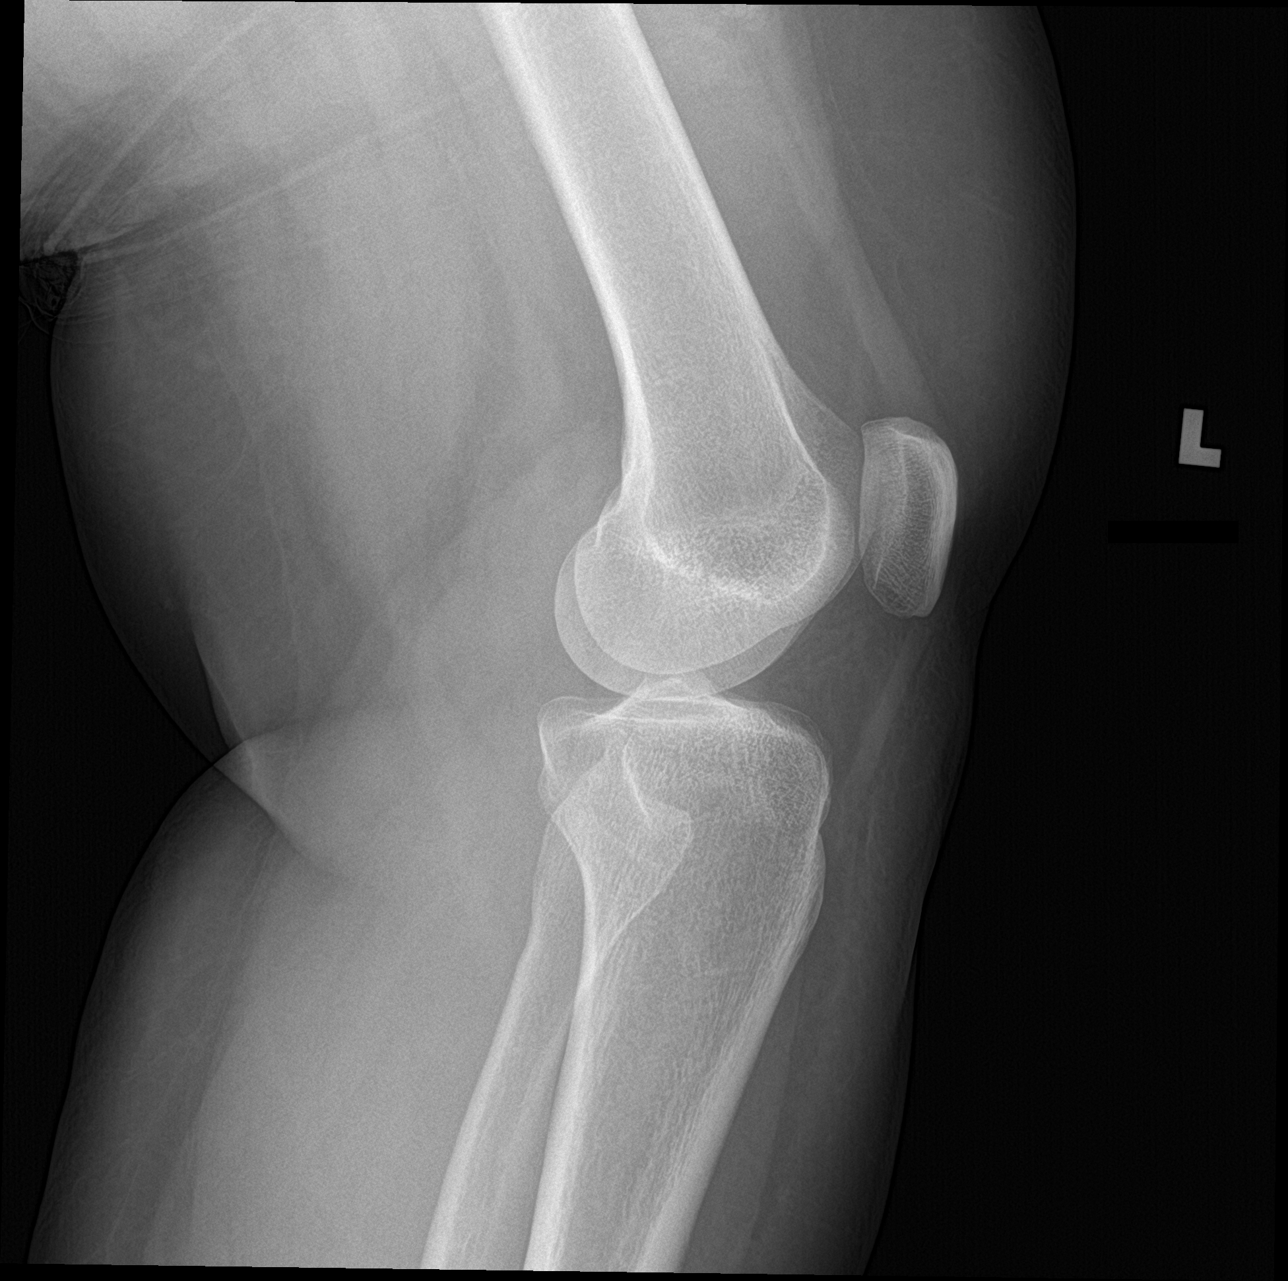

[knee sunrise]
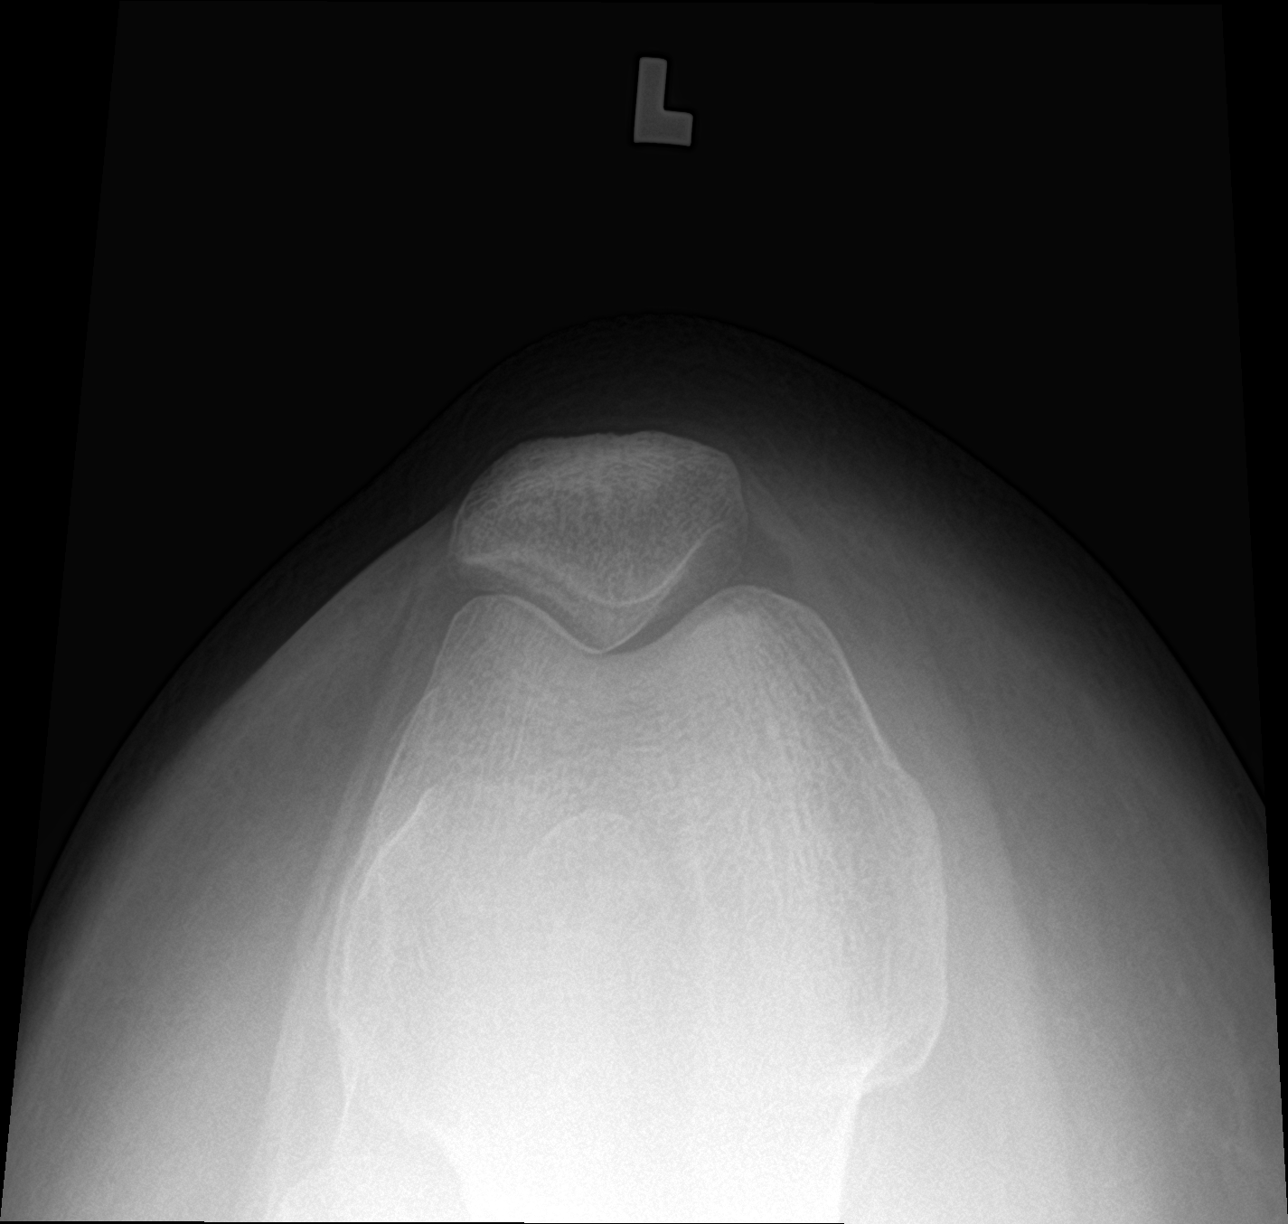

[knee ap bilat standing]
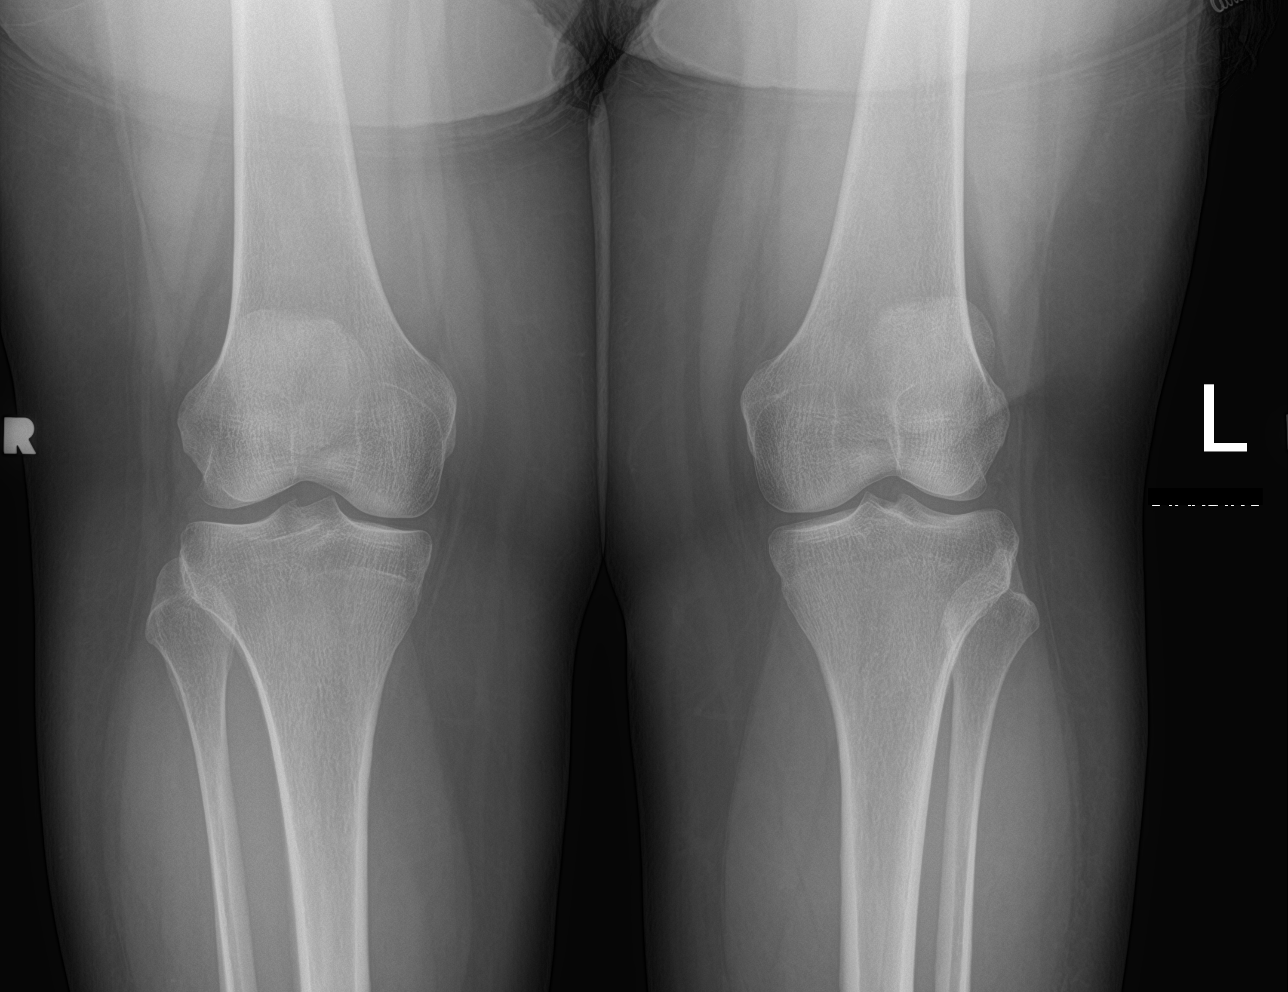

[4 of 4 positions shown; findings below may reference images not displayed]

FINDINGS: No evidence of effusion. No acute bony or joint abnormality
identified. Small subchondral cyst about the medial tibial plateau
most likely degenerative.
IMPRESSION: Small subchondral cysts noted about the medial tibial plateau most
likely degenerative. No acute bony or joint abnormality identified.

## 2023-03-11 IMAGING — DX DG KNEE 1-2V*R*
2 series · 2 of 2 positions shown · non-contrast
Comparison: No prior.

CLINICAL DATA: Left knee pain.  History of right meniscal tear.

EXAM:
RIGHT KNEE - 1-2 VIEW

[knee tunnel]
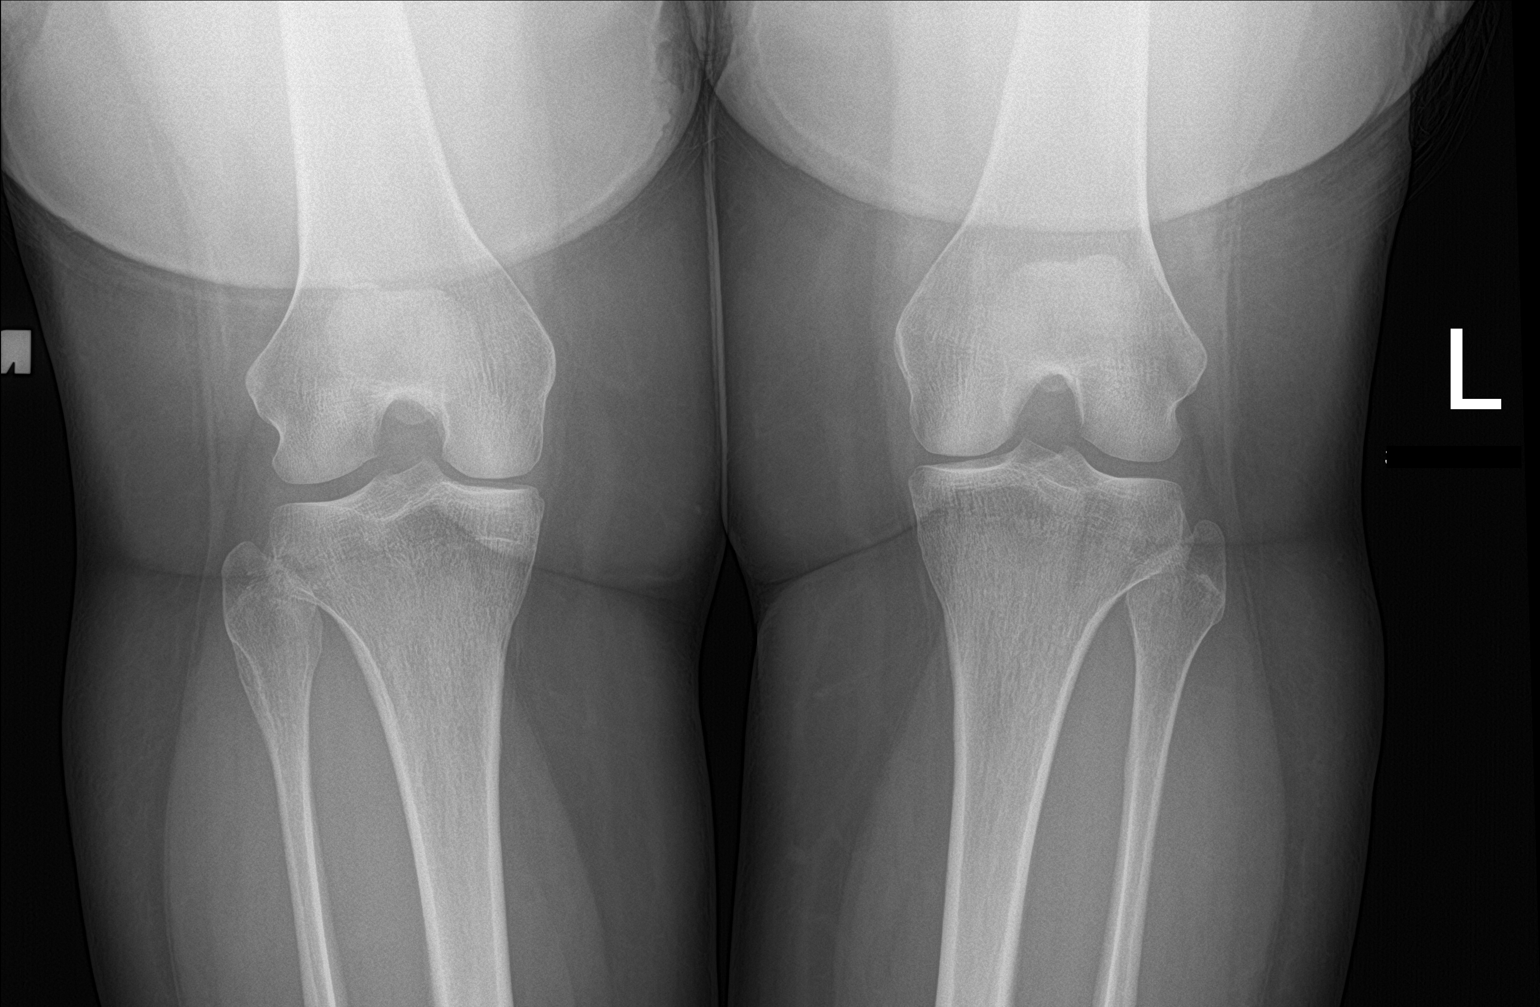

[knee ap bilat standing]
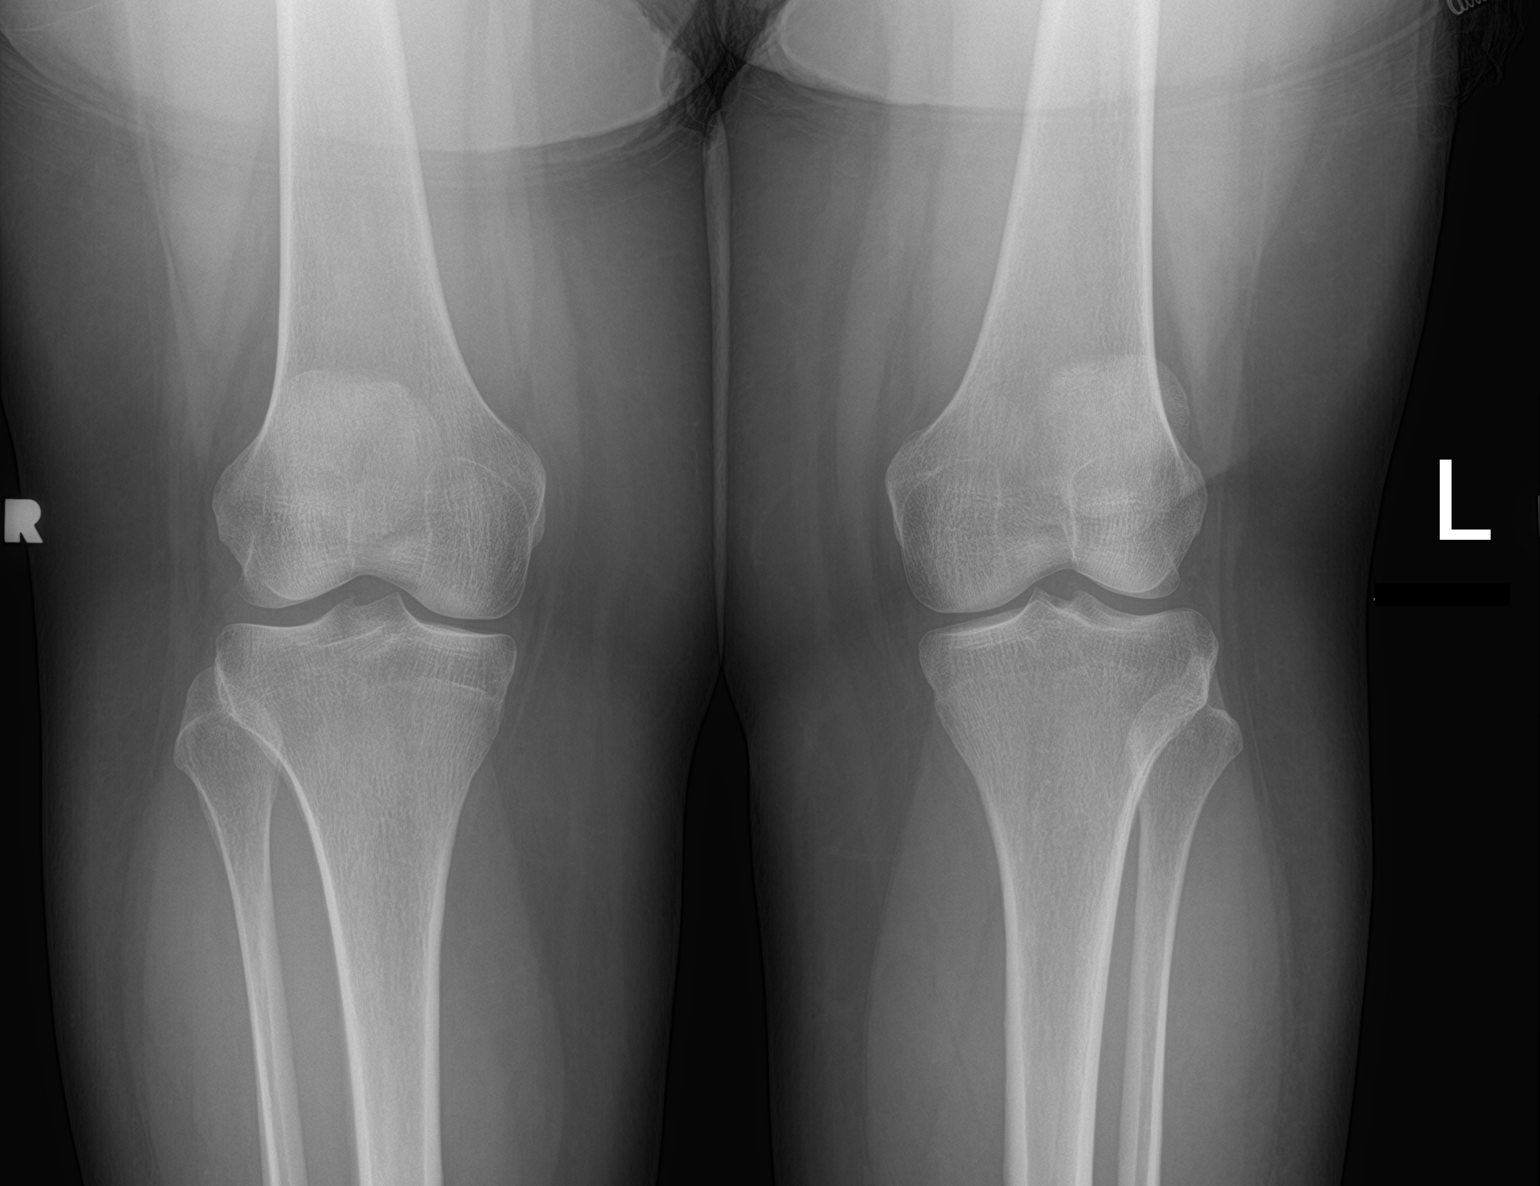

[2 of 2 positions shown; findings below may reference images not displayed]

FINDINGS: No acute or focal soft tissue bony abnormality. No evidence of
fracture or dislocation.
IMPRESSION: No acute abnormality.

## 2023-03-11 IMAGING — DX DG ANKLE COMPLETE 3+V*L*
3 series · 3 of 3 positions shown · non-contrast
Comparison: No recent.

CLINICAL DATA: Pain after injury.

EXAM:
LEFT ANKLE COMPLETE - 3+ VIEW

[ankle ap]
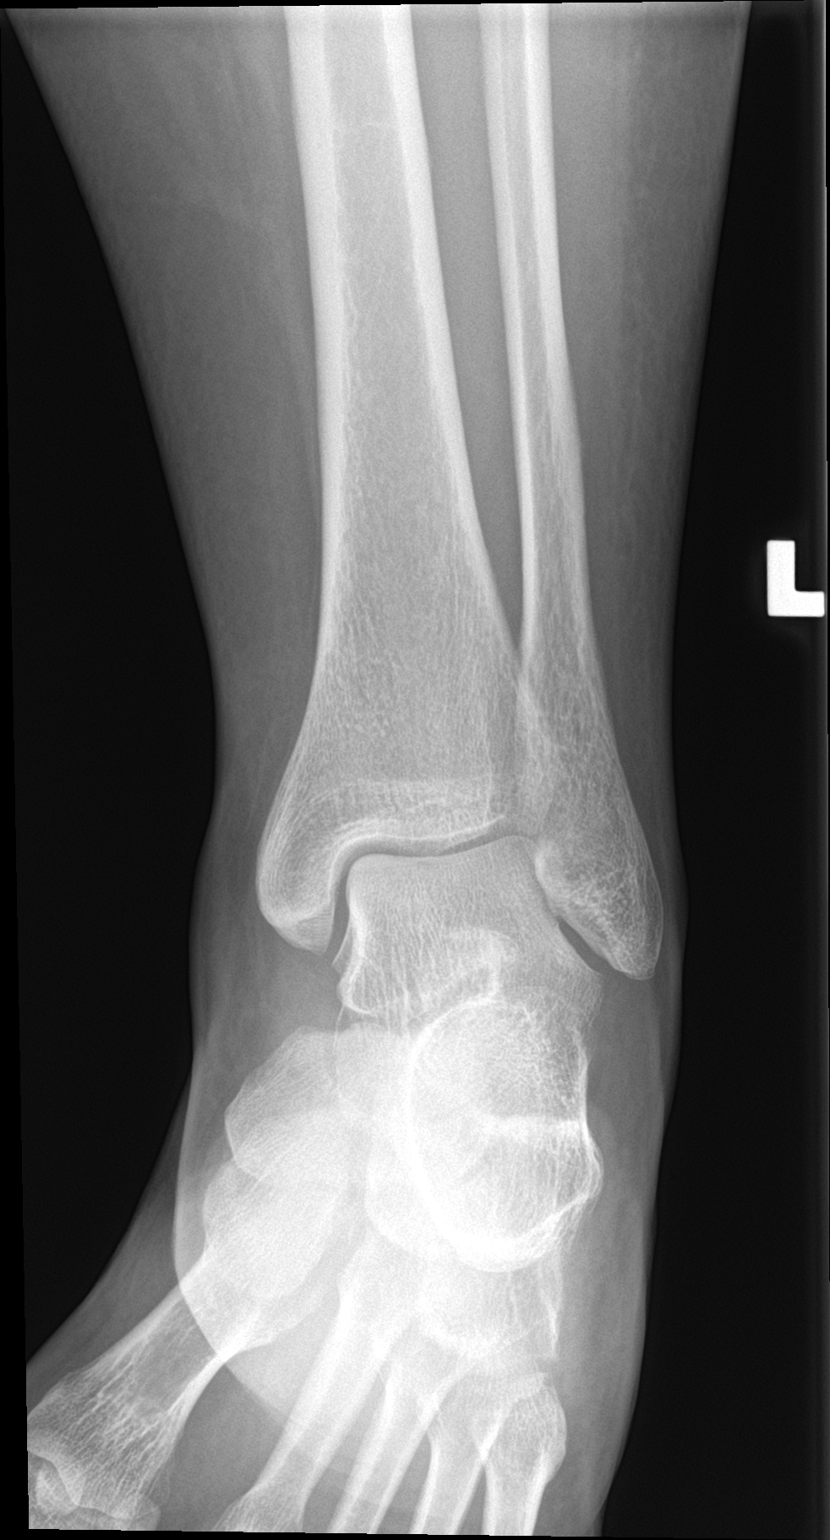

[ankle obl]
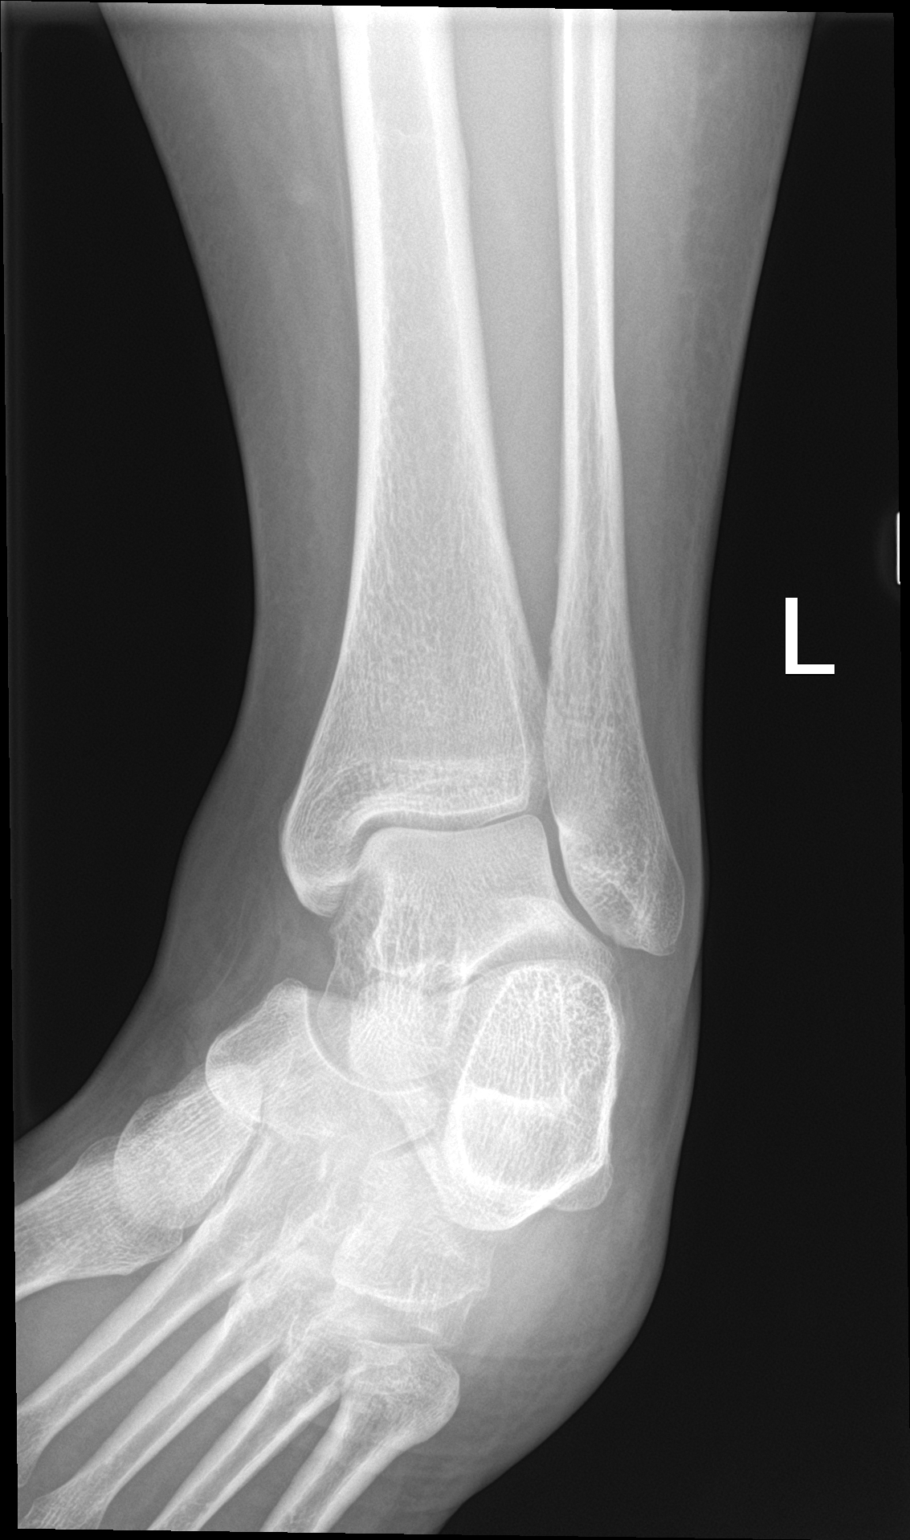

[ankle lat]
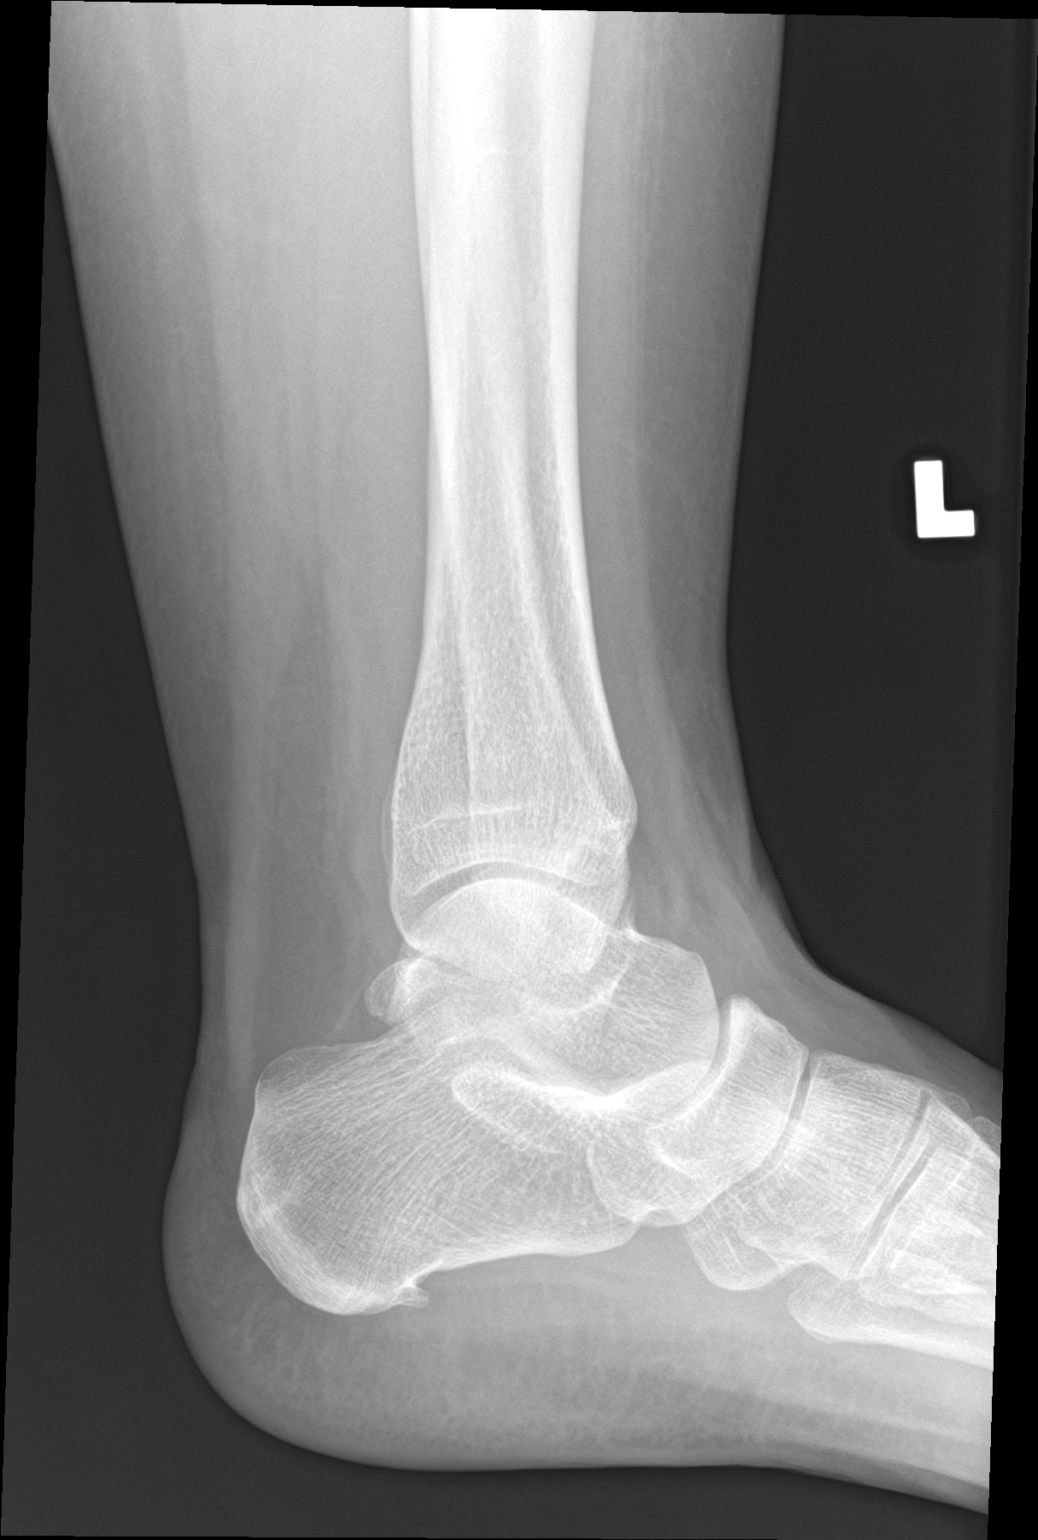

[3 of 3 positions shown; findings below may reference images not displayed]

FINDINGS: Soft tissue swelling noted the medial malleolus. No acute bony or
joint abnormality. No evidence of fracture or dislocation.
IMPRESSION: Soft tissue swelling noted over the medial malleolus. No acute bony
or joint abnormality identified.

## 2023-03-20 ENCOUNTER — Inpatient Hospital Stay (HOSPITAL_COMMUNITY): Payer: 59

## 2023-03-20 ENCOUNTER — Encounter (HOSPITAL_COMMUNITY): Payer: Self-pay | Admitting: Obstetrics and Gynecology

## 2023-03-20 ENCOUNTER — Inpatient Hospital Stay (HOSPITAL_COMMUNITY)
Admission: AD | Admit: 2023-03-20 | Discharge: 2023-03-21 | Disposition: A | Discharge status: Other Acute Inpatient Hospital | Payer: 59 | Attending: Obstetrics and Gynecology | Admitting: Obstetrics and Gynecology

## 2023-03-20 DIAGNOSIS — Z3A32 32 weeks gestation of pregnancy: Secondary | ICD-10-CM | POA: Insufficient documentation

## 2023-03-20 DIAGNOSIS — R1030 Lower abdominal pain, unspecified: Secondary | ICD-10-CM | POA: Diagnosis not present

## 2023-03-20 DIAGNOSIS — E669 Obesity, unspecified: Secondary | ICD-10-CM | POA: Diagnosis not present

## 2023-03-20 DIAGNOSIS — M545 Low back pain, unspecified: Secondary | ICD-10-CM | POA: Diagnosis not present

## 2023-03-20 DIAGNOSIS — O368330 Maternal care for abnormalities of the fetal heart rate or rhythm, third trimester, not applicable or unspecified: Secondary | ICD-10-CM | POA: Diagnosis not present

## 2023-03-20 DIAGNOSIS — Z3A31 31 weeks gestation of pregnancy: Secondary | ICD-10-CM

## 2023-03-20 DIAGNOSIS — O26893 Other specified pregnancy related conditions, third trimester: Secondary | ICD-10-CM | POA: Diagnosis not present

## 2023-03-20 DIAGNOSIS — M549 Dorsalgia, unspecified: Secondary | ICD-10-CM | POA: Diagnosis not present

## 2023-03-20 DIAGNOSIS — O99513 Diseases of the respiratory system complicating pregnancy, third trimester: Secondary | ICD-10-CM | POA: Insufficient documentation

## 2023-03-20 DIAGNOSIS — O403XX0 Polyhydramnios, third trimester, not applicable or unspecified: Secondary | ICD-10-CM | POA: Diagnosis not present

## 2023-03-20 DIAGNOSIS — O99613 Diseases of the digestive system complicating pregnancy, third trimester: Secondary | ICD-10-CM | POA: Insufficient documentation

## 2023-03-20 DIAGNOSIS — O99213 Obesity complicating pregnancy, third trimester: Secondary | ICD-10-CM

## 2023-03-20 DIAGNOSIS — O99891 Other specified diseases and conditions complicating pregnancy: Secondary | ICD-10-CM | POA: Insufficient documentation

## 2023-03-20 DIAGNOSIS — J45909 Unspecified asthma, uncomplicated: Secondary | ICD-10-CM

## 2023-03-20 DIAGNOSIS — O09523 Supervision of elderly multigravida, third trimester: Secondary | ICD-10-CM

## 2023-03-20 DIAGNOSIS — R109 Unspecified abdominal pain: Secondary | ICD-10-CM | POA: Diagnosis not present

## 2023-03-20 LAB — URINALYSIS, ROUTINE W REFLEX MICROSCOPIC
Bilirubin Urine: NEGATIVE
Glucose, UA: NEGATIVE mg/dL
Hgb urine dipstick: NEGATIVE
Ketones, ur: NEGATIVE mg/dL
Nitrite: NEGATIVE
Protein, ur: NEGATIVE mg/dL
Specific Gravity, Urine: 1.012 (ref 1.005–1.030)
pH: 6 (ref 5.0–8.0)

## 2023-03-20 LAB — CBC
HCT: 31.3 % — ABNORMAL LOW (ref 36.0–46.0)
Hemoglobin: 10 g/dL — ABNORMAL LOW (ref 12.0–15.0)
MCH: 27.6 pg (ref 26.0–34.0)
MCHC: 31.9 g/dL (ref 30.0–36.0)
MCV: 86.5 fL (ref 80.0–100.0)
Platelets: 330 10*3/uL (ref 150–400)
RBC: 3.62 MIL/uL — ABNORMAL LOW (ref 3.87–5.11)
RDW: 12.6 % (ref 11.5–15.5)
WBC: 10.9 10*3/uL — ABNORMAL HIGH (ref 4.0–10.5)
nRBC: 0 % (ref 0.0–0.2)

## 2023-03-20 MED ORDER — BETAMETHASONE SOD PHOS & ACET 6 (3-3) MG/ML IJ SUSP
12.0000 mg | Freq: Once | INTRAMUSCULAR | Status: AC
  Administered 2023-03-21: 12 mg via INTRAMUSCULAR
  Filled 2023-03-20: qty 5

## 2023-03-20 MED ORDER — LACTATED RINGERS IV BOLUS
1000.0000 mL | Freq: Once | INTRAVENOUS | Status: AC
  Administered 2023-03-20: 1000 mL via INTRAVENOUS

## 2023-03-20 NOTE — MAU Note (Signed)
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

## 2023-03-20 NOTE — MAU Note (Signed)
Fetal hiccups audible with placing cardio

## 2023-03-20 NOTE — MAU Provider Note (Incomplete)
History     CSN: [id number]  Arrival date and time: 03/20/23 2043   Chief Complaint  Patient presents with   Abdominal Pain   HPI Erica Boyer is a 37 y.o. year old [id number] female at [redacted]w[redacted]d weeks gestation who presents to MAU reporting lower abdominal cramping and lower back pain since 1700 today. She states, the pain started out as "nagging," but has progresses to being more regular and contraction like. She rates the abdominal pain a 6/10 and the back pain as a 4/10.She denies VB or LOF. She reports some increased urinary frequency. She receives [facility] with [facility]; next appt is 03/22/2023. She states she is scheduled to have a growth U/S tomorrow to check the baby's growth was "high" at her 28 wks appt.   OB History     Gravida  4   Para  1   Term  1   Preterm      AB  2   Living  1      SAB  2   IAB      Ectopic      Multiple      Live Births  1           Past Medical History:  Diagnosis Date   Anxiety with depression 02/02/2021   Asthma    Fructose intolerance    Gastroesophageal reflux disease 11/13/2021   GERD (gastroesophageal reflux disease)    History of vitamin D deficiency 11/13/2021   Lactose intolerance    Migraines 03/29/2006   Supervision of normal pregnancy 09/28/2022              NURSING     PROVIDER      Office Location    [place]    Dating by    LMP c/w U/S at 11 wks      [facility] Model    Traditional    Anatomy U/S           Initiated care at     [employer]     English                     LAB RESULTS       Support Person    Doug    Genetics    NIPS: low risk        AFP:                            NT/IT (FT only)                     Vaginal Pap smear, abnormal     Past Surgical History:  Procedure Laterality Date   NO PAST SURGERIES      Family History  Problem Relation Age of Onset   Hypertension Mother    Sleep apnea Mother    Hyperlipidemia Father    Heart failure Father     Esophageal cancer Father    Throat cancer Father    Diabetes Maternal Grandmother    Heart failure Paternal Grandfather     Social History   Tobacco Use   Smoking status: Never    Passive exposure: Never   Smokeless tobacco: Never  Vaping Use   Vaping status: Never Used  Substance  Use Topics   Alcohol use: Not Currently    Comment: socially   Drug use: Never    Allergies:  Allergies  Allergen Reactions   Cephalexin Hives, Itching and Shortness Of Breath   Mometasone Other (See Comments)    Blurred vision   Moxifloxacin Rash    dizziness   Sumatriptan Photosensitivity    Worsens migraine    Mometasone Furoate Other (See Comments)   Sumatriptan-Naproxen Sodium Photosensitivity    Medications Prior to Admission  Medication Sig Dispense Refill Last Dose   amoxicillin-clavulanate (AUGMENTIN) 875-125 MG tablet Take 1 tablet by mouth 2 (two) times daily. 20 tablet 0 Past Week   aspirin EC 81 MG tablet Take 81 mg by mouth daily. Swallow whole.   03/19/2023   fluticasone (FLONASE) 50 MCG/ACT nasal spray Place 2 sprays into both nostrils daily. 16 g 0 03/20/2023   omeprazole (PRILOSEC) 40 MG capsule Take 1 capsule (40 mg total) by mouth daily. 90 capsule 3 03/20/2023   Prenatal Vit-Fe Fumarate-FA (PRENATAL VITAMIN PO) Take by mouth.   03/19/2023   albuterol (VENTOLIN HFA) 108 (90 Base) MCG/ACT inhaler Inhale 2 puffs into the lungs every 4 (four) hours as needed for wheezing or shortness of breath. 18 g 0 More than a month   cholecalciferol (VITAMIN D3) 25 MCG (1000 UNIT) tablet Take 1 tablet (1,000 Units total) by mouth daily. 90 tablet 1 More than a month   metoCLOPramide (REGLAN) 10 MG tablet Take 1 tablet (10 mg total) by mouth every 8 (eight) hours as needed for nausea. Ideally 30 minutes prior to meal and at bedtime (Patient not taking: Reported on 03/20/2023) 30 tablet 0 Not Taking   polyethylene glycol powder (GLYCOLAX/MIRALAX) 17 GM/SCOOP powder Take 17 g by mouth 2 (two) times  daily as needed. 510 g 1    pyridOXINE (B-6) 50 MG tablet Take 50 mg by mouth daily. (Patient not taking: Reported on 03/20/2023)   Not Taking    Review of Systems  Constitutional: Negative.   HENT: Negative.    Eyes: Negative.   Respiratory: Negative.    Cardiovascular: Negative.   Gastrointestinal: Negative.   Endocrine: Negative.   Genitourinary:  Positive for pelvic pain.  Musculoskeletal:  Positive for back pain.  Skin: Negative.   Allergic/Immunologic: Negative.   Neurological: Negative.   Hematological: Negative.   Psychiatric/Behavioral: Negative.     Physical Exam   Blood pressure 123/78, pulse 87, temperature 97.6 F (36.4 C), temperature source Oral, resp. rate 17, height 5\' 1"  (1.549 m), weight 101.6 kg, last menstrual period 07/13/2022, SpO2 100%.  Physical Exam Vitals and nursing note reviewed. Exam conducted with a chaperone present.  Constitutional:      Appearance: Normal appearance. She is obese.  Cardiovascular:     Rate and Rhythm: Normal rate.  Pulmonary:     Effort: Pulmonary effort is normal.  Abdominal:     Palpations: Abdomen is soft.  Genitourinary:    Comments: Dilation: 1 Effacement (%): 50 Cervical Position: Posterior Station: Ballotable Presentation: Vertex Exam by: Raelyn Mora CNM  Musculoskeletal:        General: Normal range of motion.  Skin:    General: Skin is warm and dry.  Neurological:     Mental Status: She is alert and oriented to person, place, and time.  Psychiatric:        Mood and Affect: Mood normal.        Behavior: Behavior normal.        Thought Content:  Thought content normal.        Judgment: Judgment normal.    NON-REACTIVE NST - FHR: 230 bpm / minimal variability / accels absent / decels absent / TOCO: irregular UC's with UI noted  MAU Course  Procedures  MDM CCUA LR 1 liter bolus CBC Type & Screen OB MFM Complete U/S (Bedside) Betamethasone 12 mg IM injection fFN UDS TSH T3 T4  Results for  orders placed or performed during the hospital encounter of 03/20/23 (from the past 24 hour(s))  Urinalysis, Routine w reflex microscopic -Urine, Clean Catch     Status: Abnormal   Collection Time: 03/20/23  9:41 PM  Result Value Ref Range   Color, Urine YELLOW YELLOW   APPearance HAZY (A) CLEAR   Specific Gravity, Urine 1.012 1.005 - 1.030   pH 6.0 5.0 - 8.0   Glucose, UA NEGATIVE NEGATIVE mg/dL   Hgb urine dipstick NEGATIVE NEGATIVE   Bilirubin Urine NEGATIVE NEGATIVE   Ketones, ur NEGATIVE NEGATIVE mg/dL   Protein, ur NEGATIVE NEGATIVE mg/dL   Nitrite NEGATIVE NEGATIVE   Leukocytes,Ua SMALL (A) NEGATIVE   RBC / HPF 0-5 0 - 5 RBC/hpf   WBC, UA 6-10 0 - 5 WBC/hpf   Bacteria, UA RARE (A) NONE SEEN   Squamous Epithelial / HPF 21-50 0 - 5 /HPF   Mucus PRESENT   CBC     Status: Abnormal   Collection Time: 03/20/23 10:27 PM  Result Value Ref Range   WBC 10.9 (H) 4.0 - 10.5 K/uL   RBC 3.62 (L) 3.87 - 5.11 MIL/uL   Hemoglobin 10.0 (L) 12.0 - 15.0 g/dL   HCT 38.7 (L) 56.4 - 33.2 %   MCV 86.5 80.0 - 100.0 fL   MCH 27.6 26.0 - 34.0 pg   MCHC 31.9 30.0 - 36.0 g/dL   RDW 95.1 88.4 - 16.6 %   Platelets 330 150 - 400 K/uL   nRBC 0.0 0.0 - 0.2 %  Type and screen     Status: None   Collection Time: 03/20/23 10:27 PM  Result Value Ref Range   ABO/RH(D) O POS    Antibody Screen NEG    Sample Expiration      03/23/2023,2359 Performed at [facility], [address] [place], [place] [address]     [place] MFM OB FOLLOW UP  Result Date: 03/20/2023 ----------------------------------------------------------------------  OBSTETRICS REPORT                        (Signed Final 03/20/2023 11:36 pm) ---------------------------------------------------------------------- Patient Info  ID #:       [id number]                          D.O.B.:  10-14-1985 (37 yrs)  Name:       Erica Boyer               Visit Date: 03/20/2023 10:23 pm  ---------------------------------------------------------------------- Performed By  Attending:        Lin Landsman      Referred By:       [facility] MAU/Triage                    MD  Performed By:     Earley Brooke     [facility] ---------------------------------------------------------------------- Orders  #  Description                           Code        Ordered By  1  [place] MFM OB FOLLOW UP                   19147.82    Raelyn Mora ----------------------------------------------------------------------  #  Order #                     Accession #                Episode #  1  [id number]                   [id number]                 [id number] ---------------------------------------------------------------------- Indications  Maternal care for fetal tachycardia during      O36.8390  pregnancy  Abdominal pain in pregnancy/ Back pain          O99.89  [redacted] weeks gestation of pregnancy                 Z3A.31  Polyhydramnios, third trimester, antepartum     O40.3XX0  condition or complication, unspecified fetus  Obesity complicating pregnancy, second          O99.212  trimester ( Pre-Gravid BMI 42 )  Advanced maternal age multigravida 43+,         O72.522  second trimester (37 yrs)  Asthma                                          O99.89 j45.909  LR NIPS/Horizon negative ---------------------------------------------------------------------- Fetal Evaluation  Num Of Fetuses:          1  Fetal Heart Rate(bpm):   240  Cardiac Activity:        Tachycardia  Presentation:            Cephalic  Placenta:                Posterior  P. Cord Insertion:       Previously seen  Amniotic Fluid  AFI FV:      Within normal limits  AFI Sum(cm)     %Tile       Largest Pocket(cm)  17.5            64          5.4  RUQ(cm)       RLQ(cm)       LUQ(cm)        LLQ(cm)  5.4           4.9           3.5            3.7  ---------------------------------------------------------------------- Biometry  BPD:      84.9  mm     G. Age:  34w 1d         95  %    CI:        80.73   %  70 - 86                                                          FL/HC:       19.6  %    19.1 - 21.3  HC:      298.4  mm     G. Age:  33w 0d         44  %    HC/AC:       0.97       0.96 - 1.17  AC:        309  mm     G. Age:  34w 6d         99  %    FL/BPD:      68.9  %    71 - 87  FL:       58.5  mm     G. Age:  30w 4d         10  %    FL/AC:       18.9  %    20 - 24  HUM:      52.8  mm     G. Age:  30w 5d         28  %  Est. FW:    2192   gm    4 lb 13 oz     86  % ---------------------------------------------------------------------- OB History  Maternal Racial/Ethnic Group:   White  Gravidity:    4         Term:   1         SAB:   2 ---------------------------------------------------------------------- Gestational Age  LMP:           35w 5d        Date:  07/13/22                  EDD:   04/19/23  U/S Today:     33w 1d                                        EDD:   05/07/23  Best:          31w 6d     Det. By:  U/S C R L  (09/28/22)    EDD:   05/16/23 ---------------------------------------------------------------------- Anatomy  Cranium:               Appears normal         LVOT:                   Previously seen  Cavum:                 Previously seen        Aortic Arch:            Previously seen  Ventricles:            Previously seen        Ductal Arch:            Previously seen  Choroid Plexus:        Previously seen  Diaphragm:              Appears normal  Cerebellum:            Previously seen        Stomach:                Appears normal, left                                                                        sided  Posterior Fossa:       Previously seen        Abdomen:                Appears normal  Nuchal Fold:           Previously seen        Abdominal Wall:         Previously seen  Face:                  Orbits and profile     Cord  Vessels:           Previously seen                         previously seen  Lips:                  Previously seen        Kidneys:                Appear normal  Palate:                Previously seen        Bladder:                Appears normal  Thoracic:              Appears normal         Spine:                  Previously seen  Heart:                 Appears normal         Upper Extremities:      Previously seen                         (4CH, axis, and                         situs)  RVOT:                  Previously seen        Lower Extremities:      Previously seen  Other:  Female gender previously seen. IVC/SVC, Hands and feet/heels, Nasal          bone, lenses, maxilla, mandible and falx previously visualized. 3VV,          3VTV previously visualized. ---------------------------------------------------------------------- Cervix Uterus Adnexa  Cervix  Not visualized (advanced GA >24wks) ---------------------------------------------------------------------- Impression  Single intrauterine pregnancy here for a complete anatomy  due to maternal abdominal pain and fetal  tachycardia.  Normal anatomy with measurements consistent with dates  There is good fetal movement and amniotic fluid volume  Suboptimal views of the fetal anatomy were obtained  secondary to fetal position.  Will discuss case with Dr. Alysia Penna.  There is no evidence of fetal hydrops  noted. Fetal heart rate  persistent in the 200's. ---------------------------------------------------------------------- Recommendations  Will discuss transfer vs continuous monitoring.  Will need pediatric cardiology. ----------------------------------------------------------------------              Lin Landsman, MD Electronically Signed Final Report   03/20/2023 11:36 pm ----------------------------------------------------------------------    *Consult with Dr. Alysia Penna @ 2130 - notified of FHR on EFM and asked to come to patient's bedside. Dr. Alysia Penna at bedside to  hear the FHR is truly running at a baseline of 230s. Tx plan for OB MFM Limited U/S - orders received for complete OB MFM U/S  2223: updated with U/S results, Dr. Alysia Penna to call Dr. Grace Bushy for recommendations  2324: Dr. Alysia Penna at bedside reviewing tracing, updating patient and family member on results and possible plans of care (staying overnight here to await fetal echo in AM or transfer to [facility] or [facility] for access to pediatric cardiology.  *2350: TC to [place], RPh to check if BMZ will be acceptable to give considering patient's allergy to Mometasone (blurry vision). Ok to administer BMZ considering the difference in route and risk vs benefits. Dr. Alysia Penna.  Assessment and Plan   1. Fetal tachycardia before the onset of labor   2. [redacted] weeks gestation of pregnancy   - Transfer to [facility] via [facility] - Care accepted by Dr. Althia Forts ([facility]) - Dr. Alysia Penna assumed care of patient at 2324, but this documentation will serve as his note with attestation.  Raelyn Mora, CNM 03/21/2023, 9:40 PM    OB Attending Pt seen and examined Case discussed with MFM and NICU Enlight of fetal tachycardia and need for pediatric cardiology ie, fetal ECHO, which is not available Transfer to [facility] indicated and recommended to pt Pt is agreeable to transfer Transfer discussed with Dr. Carole Binning MFM at [facility] who agreed with and accepted transfer FFN, UDS and thyroid collected prior to transfer BMZ x 1 for [facility] given as well Transfer form completed and [facility] contacted for transfer   Nettie Elm, MD, [facility]

## 2023-03-20 NOTE — MAU Note (Signed)
.  Erica Boyer is a 36 y.o. at [redacted]w[redacted]d here in MAU reporting: starting around 1700 today how lower abdominal cramping and lower back pain-started as "nagging" with time pain became more regular and contraction like. Pt states this is her first experience with this pain. Pt denies loss of fluid, vaginal bleeding or bloody show. Pt denies pain or odor to urine. But does report increased frequency  Onset of complaint: 1700 Pain score: 6 intermittent cramping And sharp shooting pain on R side of uterus Back pain 4 but occurs with abdominal cramping Endorses+ fetal movement Fetal heart rate noted to be 230's, pt denies of fetal heart anomaly Pt received flu shot September 30th Vitals:   03/20/23 2120  BP: 131/74  Pulse: 77  Resp: 18  Temp: 97.6 F (36.4 C)     FHT:238bpm Lab orders placed from triage:

## 2023-03-20 NOTE — MAU Note (Signed)
Dr.Ervin returned to patient after speaking with pediatric cardiologist. Discussed plan of care and need for transfer

## 2023-03-20 NOTE — MAU Provider Note (Incomplete)
History     CSN: [id number]  Arrival date and time: 03/20/23 2043   Chief Complaint  Patient presents with  . Abdominal Pain   HPI Erica Boyer is a 37 y.o. year old G60P1021 female at [redacted]w[redacted]d weeks gestation who presents to MAU reporting lower abdominal cramping and lower back pain since 1700 today. She states, the pain started out as "nagging," but has progresses to being more regular and contraction like. She rates the abdominal pain a 6/10 and the back pain as a 4/10.She denies VB or LOF. She reports some increased urinary frequency. She receives [facility] with [facility]; next appt is 03/22/2023. She states she is scheduled to have a growth U/S tomorrow to check the baby's growth was "high" at her 28 wks appt.   OB History     Gravida  4   Para  1   Term  1   Preterm      AB  2   Living  1      SAB  2   IAB      Ectopic      Multiple      Live Births  1           Past Medical History:  Diagnosis Date  . Anxiety with depression 02/02/2021  . Asthma   . Fructose intolerance   . Gastroesophageal reflux disease 11/13/2021  . GERD (gastroesophageal reflux disease)   . History of vitamin D deficiency 11/13/2021  . Lactose intolerance   . Migraines 03/29/2006  . Supervision of normal pregnancy 09/28/2022              NURSING     PROVIDER      Office Location    [facility]    Dating by    LMP c/w U/S at 11 wks      [facility] Model    Traditional    Anatomy U/S           Initiated care at     [employer]     English                     LAB RESULTS       Support Person    Doug    Genetics    NIPS: low risk        AFP:                            NT/IT (FT only)                    . Vaginal Pap smear, abnormal     Past Surgical History:  Procedure Laterality Date  . NO PAST SURGERIES      Family History  Problem Relation Age of Onset  . Hypertension Mother   . Sleep apnea Mother   . Hyperlipidemia Father   . Heart failure  Father   . Esophageal cancer Father   . Throat cancer Father   . Diabetes Maternal Grandmother   . Heart failure Paternal Grandfather     Social History   Tobacco Use  . Smoking status: Never    Passive exposure: Never  . Smokeless tobacco: Never  Vaping Use  . Vaping status: Never Used  Substance  Use Topics  . Alcohol use: Not Currently    Comment: socially  . Drug use: Never    Allergies:  Allergies  Allergen Reactions  . Cephalexin Hives, Itching and Shortness Of Breath  . Mometasone Other (See Comments)    Blurred vision  . Moxifloxacin Rash    dizziness  . Sumatriptan Photosensitivity    Worsens migraine   . Mometasone Furoate Other (See Comments)  . Sumatriptan-Naproxen Sodium Photosensitivity    Medications Prior to Admission  Medication Sig Dispense Refill Last Dose  . amoxicillin-clavulanate (AUGMENTIN) 875-125 MG tablet Take 1 tablet by mouth 2 (two) times daily. 20 tablet 0 Past Week  . aspirin EC 81 MG tablet Take 81 mg by mouth daily. Swallow whole.   03/19/2023  . fluticasone (FLONASE) 50 MCG/ACT nasal spray Place 2 sprays into both nostrils daily. 16 g 0 03/20/2023  . omeprazole (PRILOSEC) 40 MG capsule Take 1 capsule (40 mg total) by mouth daily. 90 capsule 3 03/20/2023  . Prenatal Vit-Fe Fumarate-FA (PRENATAL VITAMIN PO) Take by mouth.   03/19/2023  . albuterol (VENTOLIN HFA) 108 (90 Base) MCG/ACT inhaler Inhale 2 puffs into the lungs every 4 (four) hours as needed for wheezing or shortness of breath. 18 g 0 More than a month  . cholecalciferol (VITAMIN D3) 25 MCG (1000 UNIT) tablet Take 1 tablet (1,000 Units total) by mouth daily. 90 tablet 1 More than a month  . metoCLOPramide (REGLAN) 10 MG tablet Take 1 tablet (10 mg total) by mouth every 8 (eight) hours as needed for nausea. Ideally 30 minutes prior to meal and at bedtime (Patient not taking: Reported on 03/20/2023) 30 tablet 0 Not Taking  . polyethylene glycol powder (GLYCOLAX/MIRALAX) 17 GM/SCOOP  powder Take 17 g by mouth 2 (two) times daily as needed. 510 g 1   . pyridOXINE (B-6) 50 MG tablet Take 50 mg by mouth daily. (Patient not taking: Reported on 03/20/2023)   Not Taking    Review of Systems  Constitutional: Negative.   HENT: Negative.    Eyes: Negative.   Respiratory: Negative.    Cardiovascular: Negative.   Gastrointestinal: Negative.   Endocrine: Negative.   Genitourinary:  Positive for pelvic pain.  Musculoskeletal:  Positive for back pain.  Skin: Negative.   Allergic/Immunologic: Negative.   Neurological: Negative.   Hematological: Negative.   Psychiatric/Behavioral: Negative.     Physical Exam   Blood pressure 131/74, pulse 77, temperature 97.6 F (36.4 C), temperature source Oral, resp. rate 18, height 5\' 1"  (1.549 m), weight 101.6 kg, last menstrual period 07/13/2022.  Physical Exam Vitals and nursing note reviewed. Exam conducted with a chaperone present.  Constitutional:      Appearance: Normal appearance. She is obese.  Cardiovascular:     Rate and Rhythm: Normal rate.  Pulmonary:     Effort: Pulmonary effort is normal.  Abdominal:     Palpations: Abdomen is soft.  Genitourinary:    Comments: Dilation: 1 Effacement (%): 50 Cervical Position: Posterior Station: Ballotable Presentation: Vertex Exam by: Raelyn Mora CNM  Musculoskeletal:        General: Normal range of motion.  Skin:    General: Skin is warm and dry.  Neurological:     Mental Status: She is alert and oriented to person, place, and time.  Psychiatric:        Mood and Affect: Mood normal.        Behavior: Behavior normal.        Thought Content: Thought content  normal.        Judgment: Judgment normal.    NON-REACTIVE NST - FHR: 230 bpm / minimal variability / accels absent / decels absent / TOCO: regular UC's every 5 mins with UI noted  MAU Course  Procedures  MDM CCUA LR 1 liter bolus CBC Type & Screen OB MFM Complete U/S (Bedside) Betamethasone 12 mg IM  injection fFN  Results for orders placed or performed during the hospital encounter of 03/20/23 (from the past 24 hour(s))  Urinalysis, Routine w reflex microscopic -Urine, Clean Catch     Status: Abnormal   Collection Time: 03/20/23  9:41 PM  Result Value Ref Range   Color, Urine YELLOW YELLOW   APPearance HAZY (A) CLEAR   Specific Gravity, Urine 1.012 1.005 - 1.030   pH 6.0 5.0 - 8.0   Glucose, UA NEGATIVE NEGATIVE mg/dL   Hgb urine dipstick NEGATIVE NEGATIVE   Bilirubin Urine NEGATIVE NEGATIVE   Ketones, ur NEGATIVE NEGATIVE mg/dL   Protein, ur NEGATIVE NEGATIVE mg/dL   Nitrite NEGATIVE NEGATIVE   Leukocytes,Ua SMALL (A) NEGATIVE   RBC / HPF 0-5 0 - 5 RBC/hpf   WBC, UA 6-10 0 - 5 WBC/hpf   Bacteria, UA RARE (A) NONE SEEN   Squamous Epithelial / HPF 21-50 0 - 5 /HPF   Mucus PRESENT   CBC     Status: Abnormal   Collection Time: 03/20/23 10:27 PM  Result Value Ref Range   WBC 10.9 (H) 4.0 - 10.5 K/uL   RBC 3.62 (L) 3.87 - 5.11 MIL/uL   Hemoglobin 10.0 (L) 12.0 - 15.0 g/dL   HCT 16.1 (L) 09.6 - 04.5 %   MCV 86.5 80.0 - 100.0 fL   MCH 27.6 26.0 - 34.0 pg   MCHC 31.9 30.0 - 36.0 g/dL   RDW 40.9 81.1 - 91.4 %   Platelets 330 150 - 400 K/uL   nRBC 0.0 0.0 - 0.2 %  Type and screen     Status: None (Preliminary result)   Collection Time: 03/20/23 10:27 PM  Result Value Ref Range   ABO/RH(D) PENDING    Antibody Screen PENDING    Sample Expiration      03/23/2023,2359 Performed at [facility], [address], [place], [place] [address]     *Consult with Dr. Alysia Penna @ 2130 - notified of FHR on EFM and asked to come to patient's bedside. Dr. Alysia Penna at bedside to hear the FHR is truly running at a baseline of 230s. Tx plan for OB MFM Limited U/S - orders received for complete OB MFM U/S  2223: updated with U/S results, Dr. Alysia Penna to call Dr. Grace Bushy for recommendations  2324: Dr. Alysia Penna at bedside reviewing tracing, updating patient and family member on results and possible plans  of care (staying overnight here to await fetal echo in AM or transfer to [facility] or [facility] for access to pediatric cardiology.  *2350: TC to [place], RPh to check if BMZ will be acceptable to give considering patient's allergy to Mometasone (blurry vision). Ok to administer BMZ considering the difference in route and risk vs benefits. Dr. Alysia Penna.  Assessment and Plan  ***  Raelyn Mora, CNM 03/20/2023, 9:40 PM

## 2023-03-20 NOTE — MAU Note (Signed)
DR.Ervin and Erica Boyer CNM in room-MD discussed findings on ultrasound and need for fetal consult. Discussed neat for betamethasone for mature the babys lungs.  Now audible irregularity to FHR-unable to maintain tracing during the 3 minute period.  Dr.Ervin notified. Speaking with pediatric cardiologist

## 2023-03-21 ENCOUNTER — Ambulatory Visit: Payer: 59

## 2023-03-21 DIAGNOSIS — D649 Anemia, unspecified: Secondary | ICD-10-CM | POA: Diagnosis not present

## 2023-03-21 DIAGNOSIS — O99513 Diseases of the respiratory system complicating pregnancy, third trimester: Secondary | ICD-10-CM | POA: Diagnosis not present

## 2023-03-21 DIAGNOSIS — O99213 Obesity complicating pregnancy, third trimester: Secondary | ICD-10-CM | POA: Diagnosis not present

## 2023-03-21 DIAGNOSIS — O09523 Supervision of elderly multigravida, third trimester: Secondary | ICD-10-CM | POA: Diagnosis not present

## 2023-03-21 DIAGNOSIS — J45909 Unspecified asthma, uncomplicated: Secondary | ICD-10-CM | POA: Diagnosis not present

## 2023-03-21 DIAGNOSIS — Z3689 Encounter for other specified antenatal screening: Secondary | ICD-10-CM | POA: Diagnosis not present

## 2023-03-21 DIAGNOSIS — O368390 Maternal care for abnormalities of the fetal heart rate or rhythm, unspecified trimester, not applicable or unspecified: Secondary | ICD-10-CM | POA: Diagnosis not present

## 2023-03-21 DIAGNOSIS — O4703 False labor before 37 completed weeks of gestation, third trimester: Secondary | ICD-10-CM | POA: Diagnosis not present

## 2023-03-21 DIAGNOSIS — Z881 Allergy status to other antibiotic agents status: Secondary | ICD-10-CM | POA: Diagnosis not present

## 2023-03-21 DIAGNOSIS — O368330 Maternal care for abnormalities of the fetal heart rate or rhythm, third trimester, not applicable or unspecified: Secondary | ICD-10-CM | POA: Diagnosis not present

## 2023-03-21 DIAGNOSIS — O26893 Other specified pregnancy related conditions, third trimester: Secondary | ICD-10-CM | POA: Diagnosis not present

## 2023-03-21 DIAGNOSIS — O99891 Other specified diseases and conditions complicating pregnancy: Secondary | ICD-10-CM | POA: Diagnosis not present

## 2023-03-21 DIAGNOSIS — O99613 Diseases of the digestive system complicating pregnancy, third trimester: Secondary | ICD-10-CM | POA: Diagnosis not present

## 2023-03-21 DIAGNOSIS — Z888 Allergy status to other drugs, medicaments and biological substances status: Secondary | ICD-10-CM | POA: Diagnosis not present

## 2023-03-21 DIAGNOSIS — Z3A32 32 weeks gestation of pregnancy: Secondary | ICD-10-CM | POA: Diagnosis not present

## 2023-03-21 DIAGNOSIS — O99013 Anemia complicating pregnancy, third trimester: Secondary | ICD-10-CM | POA: Diagnosis not present

## 2023-03-21 DIAGNOSIS — R109 Unspecified abdominal pain: Secondary | ICD-10-CM | POA: Diagnosis not present

## 2023-03-21 LAB — FETAL FIBRONECTIN: Fetal Fibronectin: NEGATIVE

## 2023-03-21 LAB — RAPID URINE DRUG SCREEN, HOSP PERFORMED
Amphetamines: NOT DETECTED
Barbiturates: NOT DETECTED
Benzodiazepines: NOT DETECTED
Cocaine: NOT DETECTED
Opiates: NOT DETECTED
Tetrahydrocannabinol: NOT DETECTED

## 2023-03-21 LAB — TYPE AND SCREEN

## 2023-03-21 LAB — TSH: TSH: 2.522 u[IU]/mL (ref 0.350–4.500)

## 2023-03-21 NOTE — MAU Note (Signed)
[facility] called-report given stated there would be atlease an hour delay-cleared with Dr.Ervin-RN to notify him if it is closer to 2 hours for alternate transfer options

## 2023-03-22 ENCOUNTER — Encounter: Payer: 59 | Admitting: Obstetrics and Gynecology

## 2023-03-22 LAB — THYROID PANEL
Free Thyroxine Index: 0.8 — ABNORMAL LOW (ref 1.2–4.9)
T3 Uptake Ratio: 8 % — ABNORMAL LOW (ref 24–39)
T4, Total: 10.4 ug/dL (ref 4.5–12.0)

## 2023-03-22 LAB — T3: T3, Total: 180 ng/dL (ref 71–180)

## 2023-03-24 ENCOUNTER — Telehealth: Payer: Self-pay | Admitting: *Deleted

## 2023-03-24 DIAGNOSIS — O368390 Maternal care for abnormalities of the fetal heart rate or rhythm, unspecified trimester, not applicable or unspecified: Secondary | ICD-10-CM | POA: Insufficient documentation

## 2023-03-24 HISTORY — DX: Maternal care for abnormalities of the fetal heart rate or rhythm, unspecified trimester, not applicable or unspecified: O36.8390

## 2023-03-24 NOTE — Telephone Encounter (Signed)
Returned call from 1:47 PM. Left patient a message that the office will call her back around 3:00 PM since she is being D/C from [facility] today and might be in the middle of leaving.

## 2023-03-24 NOTE — Telephone Encounter (Signed)
Office will call back.

## 2023-03-24 NOTE — Telephone Encounter (Signed)
Left patient a message to call and schedule twice weekly NSTs starting with Monday, 03/28/2023.

## 2023-03-26 ENCOUNTER — Encounter: Payer: Self-pay | Admitting: Medical-Surgical

## 2023-03-28 ENCOUNTER — Ambulatory Visit (INDEPENDENT_AMBULATORY_CARE_PROVIDER_SITE_OTHER): Payer: 59 | Admitting: *Deleted

## 2023-03-28 VITALS — BP 115/82 | HR 103 | Wt 225.1 lb

## 2023-03-28 DIAGNOSIS — Z3689 Encounter for other specified antenatal screening: Secondary | ICD-10-CM

## 2023-03-28 DIAGNOSIS — Z3A32 32 weeks gestation of pregnancy: Secondary | ICD-10-CM | POA: Diagnosis not present

## 2023-03-28 DIAGNOSIS — O09523 Supervision of elderly multigravida, third trimester: Secondary | ICD-10-CM

## 2023-03-28 DIAGNOSIS — O403XX1 Polyhydramnios, third trimester, fetus 1: Secondary | ICD-10-CM

## 2023-03-28 NOTE — Progress Notes (Signed)
Erica Boyer presents for NST. NST reactive as charted. NST reviewed by Dr. Macon Large.

## 2023-03-29 ENCOUNTER — Ambulatory Visit: Payer: 59 | Admitting: Medical-Surgical

## 2023-03-29 DIAGNOSIS — O368390 Maternal care for abnormalities of the fetal heart rate or rhythm, unspecified trimester, not applicable or unspecified: Secondary | ICD-10-CM | POA: Diagnosis not present

## 2023-03-29 DIAGNOSIS — Z3A36 36 weeks gestation of pregnancy: Secondary | ICD-10-CM | POA: Diagnosis not present

## 2023-03-29 DIAGNOSIS — O09293 Supervision of pregnancy with other poor reproductive or obstetric history, third trimester: Secondary | ICD-10-CM | POA: Diagnosis not present

## 2023-03-29 DIAGNOSIS — F418 Other specified anxiety disorders: Secondary | ICD-10-CM | POA: Diagnosis not present

## 2023-03-29 DIAGNOSIS — O133 Gestational [pregnancy-induced] hypertension without significant proteinuria, third trimester: Secondary | ICD-10-CM | POA: Diagnosis not present

## 2023-03-29 DIAGNOSIS — I517 Cardiomegaly: Secondary | ICD-10-CM | POA: Diagnosis not present

## 2023-03-29 DIAGNOSIS — D649 Anemia, unspecified: Secondary | ICD-10-CM | POA: Diagnosis not present

## 2023-03-29 DIAGNOSIS — O368330 Maternal care for abnormalities of the fetal heart rate or rhythm, third trimester, not applicable or unspecified: Secondary | ICD-10-CM | POA: Diagnosis not present

## 2023-03-29 DIAGNOSIS — R55 Syncope and collapse: Secondary | ICD-10-CM | POA: Diagnosis not present

## 2023-03-29 DIAGNOSIS — O99513 Diseases of the respiratory system complicating pregnancy, third trimester: Secondary | ICD-10-CM | POA: Diagnosis not present

## 2023-03-29 DIAGNOSIS — I4892 Unspecified atrial flutter: Secondary | ICD-10-CM | POA: Diagnosis not present

## 2023-03-29 DIAGNOSIS — Z8759 Personal history of other complications of pregnancy, childbirth and the puerperium: Secondary | ICD-10-CM | POA: Diagnosis not present

## 2023-03-29 DIAGNOSIS — R7303 Prediabetes: Secondary | ICD-10-CM | POA: Diagnosis not present

## 2023-03-29 DIAGNOSIS — I471 Supraventricular tachycardia, unspecified: Secondary | ICD-10-CM | POA: Diagnosis not present

## 2023-03-29 DIAGNOSIS — D509 Iron deficiency anemia, unspecified: Secondary | ICD-10-CM | POA: Diagnosis not present

## 2023-03-29 DIAGNOSIS — O99343 Other mental disorders complicating pregnancy, third trimester: Secondary | ICD-10-CM | POA: Diagnosis not present

## 2023-03-29 DIAGNOSIS — Z3689 Encounter for other specified antenatal screening: Secondary | ICD-10-CM | POA: Diagnosis not present

## 2023-03-29 DIAGNOSIS — Z3A33 33 weeks gestation of pregnancy: Secondary | ICD-10-CM | POA: Diagnosis not present

## 2023-03-29 DIAGNOSIS — O09523 Supervision of elderly multigravida, third trimester: Secondary | ICD-10-CM | POA: Diagnosis not present

## 2023-03-29 DIAGNOSIS — Z3A34 34 weeks gestation of pregnancy: Secondary | ICD-10-CM | POA: Diagnosis not present

## 2023-03-29 DIAGNOSIS — J45909 Unspecified asthma, uncomplicated: Secondary | ICD-10-CM | POA: Diagnosis not present

## 2023-03-29 DIAGNOSIS — O99013 Anemia complicating pregnancy, third trimester: Secondary | ICD-10-CM | POA: Diagnosis not present

## 2023-03-29 DIAGNOSIS — O368130 Decreased fetal movements, third trimester, not applicable or unspecified: Secondary | ICD-10-CM | POA: Diagnosis not present

## 2023-03-31 ENCOUNTER — Other Ambulatory Visit: Payer: 59

## 2023-04-03 DIAGNOSIS — I517 Cardiomegaly: Secondary | ICD-10-CM | POA: Diagnosis not present

## 2023-04-03 DIAGNOSIS — Z3A33 33 weeks gestation of pregnancy: Secondary | ICD-10-CM | POA: Diagnosis not present

## 2023-04-03 DIAGNOSIS — O368330 Maternal care for abnormalities of the fetal heart rate or rhythm, third trimester, not applicable or unspecified: Secondary | ICD-10-CM | POA: Diagnosis not present

## 2023-04-04 ENCOUNTER — Other Ambulatory Visit: Payer: 59

## 2023-04-04 DIAGNOSIS — I517 Cardiomegaly: Secondary | ICD-10-CM | POA: Diagnosis not present

## 2023-04-05 ENCOUNTER — Encounter: Payer: 59 | Admitting: Obstetrics and Gynecology

## 2023-04-07 ENCOUNTER — Other Ambulatory Visit: Payer: 59

## 2023-04-10 DIAGNOSIS — Z3A34 34 weeks gestation of pregnancy: Secondary | ICD-10-CM | POA: Diagnosis not present

## 2023-04-10 DIAGNOSIS — O368330 Maternal care for abnormalities of the fetal heart rate or rhythm, third trimester, not applicable or unspecified: Secondary | ICD-10-CM | POA: Diagnosis not present

## 2023-04-10 DIAGNOSIS — O368130 Decreased fetal movements, third trimester, not applicable or unspecified: Secondary | ICD-10-CM | POA: Diagnosis not present

## 2023-04-10 DIAGNOSIS — Z79899 Other long term (current) drug therapy: Secondary | ICD-10-CM | POA: Diagnosis not present

## 2023-04-11 ENCOUNTER — Ambulatory Visit (INDEPENDENT_AMBULATORY_CARE_PROVIDER_SITE_OTHER): Payer: 59 | Admitting: *Deleted

## 2023-04-11 VITALS — BP 118/81 | HR 80 | Wt 230.0 lb

## 2023-04-11 DIAGNOSIS — O09523 Supervision of elderly multigravida, third trimester: Secondary | ICD-10-CM

## 2023-04-11 DIAGNOSIS — O403XX1 Polyhydramnios, third trimester, fetus 1: Secondary | ICD-10-CM | POA: Diagnosis not present

## 2023-04-11 DIAGNOSIS — Z364 Encounter for antenatal screening for fetal growth retardation: Secondary | ICD-10-CM

## 2023-04-11 DIAGNOSIS — Z3A35 35 weeks gestation of pregnancy: Secondary | ICD-10-CM

## 2023-04-11 DIAGNOSIS — Z3689 Encounter for other specified antenatal screening: Secondary | ICD-10-CM

## 2023-04-11 NOTE — Progress Notes (Signed)
Pt here for NST only.  NST is reactive and sent to Dr Penne Lash for review.  She will return on Thursday for antenatal testing.

## 2023-04-12 DIAGNOSIS — O99891 Other specified diseases and conditions complicating pregnancy: Secondary | ICD-10-CM | POA: Diagnosis not present

## 2023-04-12 DIAGNOSIS — O99513 Diseases of the respiratory system complicating pregnancy, third trimester: Secondary | ICD-10-CM | POA: Diagnosis not present

## 2023-04-12 DIAGNOSIS — O3443 Maternal care for other abnormalities of cervix, third trimester: Secondary | ICD-10-CM

## 2023-04-12 DIAGNOSIS — Z3A35 35 weeks gestation of pregnancy: Secondary | ICD-10-CM | POA: Diagnosis not present

## 2023-04-12 DIAGNOSIS — R7303 Prediabetes: Secondary | ICD-10-CM | POA: Diagnosis not present

## 2023-04-12 DIAGNOSIS — Z9889 Other specified postprocedural states: Secondary | ICD-10-CM | POA: Insufficient documentation

## 2023-04-12 DIAGNOSIS — O99213 Obesity complicating pregnancy, third trimester: Secondary | ICD-10-CM | POA: Diagnosis not present

## 2023-04-12 DIAGNOSIS — O09523 Supervision of elderly multigravida, third trimester: Secondary | ICD-10-CM | POA: Diagnosis not present

## 2023-04-12 DIAGNOSIS — O133 Gestational [pregnancy-induced] hypertension without significant proteinuria, third trimester: Secondary | ICD-10-CM | POA: Diagnosis not present

## 2023-04-12 DIAGNOSIS — D509 Iron deficiency anemia, unspecified: Secondary | ICD-10-CM | POA: Diagnosis not present

## 2023-04-12 DIAGNOSIS — O368330 Maternal care for abnormalities of the fetal heart rate or rhythm, third trimester, not applicable or unspecified: Secondary | ICD-10-CM | POA: Diagnosis not present

## 2023-04-12 DIAGNOSIS — O99013 Anemia complicating pregnancy, third trimester: Secondary | ICD-10-CM | POA: Diagnosis not present

## 2023-04-12 DIAGNOSIS — Z3A39 39 weeks gestation of pregnancy: Secondary | ICD-10-CM | POA: Diagnosis not present

## 2023-04-12 HISTORY — DX: Gestational (pregnancy-induced) hypertension without significant proteinuria, third trimester: O13.3

## 2023-04-12 HISTORY — DX: Maternal care for other abnormalities of cervix, third trimester: O34.43

## 2023-04-14 ENCOUNTER — Ambulatory Visit: Payer: 59 | Admitting: *Deleted

## 2023-04-14 VITALS — BP 131/74 | HR 74 | Wt 235.0 lb

## 2023-04-14 DIAGNOSIS — O09523 Supervision of elderly multigravida, third trimester: Secondary | ICD-10-CM

## 2023-04-14 DIAGNOSIS — Z3A35 35 weeks gestation of pregnancy: Secondary | ICD-10-CM

## 2023-04-14 DIAGNOSIS — Z3689 Encounter for other specified antenatal screening: Secondary | ICD-10-CM

## 2023-04-14 NOTE — Progress Notes (Signed)
NSt today-Reactive and reviewed by Dr Berton Lan

## 2023-04-18 ENCOUNTER — Other Ambulatory Visit: Payer: 59 | Admitting: *Deleted

## 2023-04-18 DIAGNOSIS — Z5181 Encounter for therapeutic drug level monitoring: Secondary | ICD-10-CM | POA: Diagnosis not present

## 2023-04-18 DIAGNOSIS — Z8249 Family history of ischemic heart disease and other diseases of the circulatory system: Secondary | ICD-10-CM | POA: Diagnosis not present

## 2023-04-18 DIAGNOSIS — Z8759 Personal history of other complications of pregnancy, childbirth and the puerperium: Secondary | ICD-10-CM | POA: Diagnosis not present

## 2023-04-18 DIAGNOSIS — Z79899 Other long term (current) drug therapy: Secondary | ICD-10-CM | POA: Diagnosis not present

## 2023-04-18 DIAGNOSIS — R9431 Abnormal electrocardiogram [ECG] [EKG]: Secondary | ICD-10-CM | POA: Diagnosis not present

## 2023-04-18 DIAGNOSIS — O368390 Maternal care for abnormalities of the fetal heart rate or rhythm, unspecified trimester, not applicable or unspecified: Secondary | ICD-10-CM | POA: Diagnosis not present

## 2023-04-18 DIAGNOSIS — I517 Cardiomegaly: Secondary | ICD-10-CM | POA: Diagnosis not present

## 2023-04-21 ENCOUNTER — Other Ambulatory Visit (HOSPITAL_COMMUNITY)
Admission: RE | Admit: 2023-04-21 | Discharge: 2023-04-21 | Disposition: A | Discharge status: Home / Self Care | Payer: 59 | Source: Ambulatory Visit | Attending: Obstetrics and Gynecology | Admitting: Obstetrics and Gynecology

## 2023-04-21 ENCOUNTER — Encounter: Payer: 59 | Admitting: Obstetrics and Gynecology

## 2023-04-21 ENCOUNTER — Ambulatory Visit: Payer: 59 | Admitting: *Deleted

## 2023-04-21 VITALS — Wt 230.0 lb

## 2023-04-21 DIAGNOSIS — Z3490 Encounter for supervision of normal pregnancy, unspecified, unspecified trimester: Secondary | ICD-10-CM | POA: Diagnosis not present

## 2023-04-21 DIAGNOSIS — Z3483 Encounter for supervision of other normal pregnancy, third trimester: Secondary | ICD-10-CM | POA: Insufficient documentation

## 2023-04-21 DIAGNOSIS — Z3A00 Weeks of gestation of pregnancy not specified: Secondary | ICD-10-CM | POA: Diagnosis not present

## 2023-04-21 DIAGNOSIS — Z3A36 36 weeks gestation of pregnancy: Secondary | ICD-10-CM

## 2023-04-21 NOTE — Progress Notes (Signed)
NSt today is reactive.  GBS and Chlamydia cultures obtained today.  Pt schedule for IOL @ [facility] on Monday  04/25/23.

## 2023-04-22 LAB — CERVICOVAGINAL ANCILLARY ONLY
Chlamydia: NEGATIVE
Comment: NEGATIVE
Comment: NORMAL
Neisseria Gonorrhea: NEGATIVE

## 2023-04-24 ENCOUNTER — Other Ambulatory Visit: Payer: Self-pay

## 2023-04-24 ENCOUNTER — Ambulatory Visit
Admission: RE | Admit: 2023-04-24 | Discharge: 2023-04-24 | Disposition: A | Discharge status: Home / Self Care | Payer: 59 | Source: Ambulatory Visit | Attending: Family Medicine | Admitting: Family Medicine

## 2023-04-24 VITALS — BP 112/75 | HR 76 | Temp 98.3°F | Resp 18 | Ht 61.0 in | Wt 230.0 lb

## 2023-04-24 DIAGNOSIS — J039 Acute tonsillitis, unspecified: Secondary | ICD-10-CM

## 2023-04-24 DIAGNOSIS — Z3A36 36 weeks gestation of pregnancy: Secondary | ICD-10-CM | POA: Diagnosis not present

## 2023-04-24 DIAGNOSIS — H9203 Otalgia, bilateral: Secondary | ICD-10-CM

## 2023-04-24 LAB — POCT RAPID STREP A (OFFICE): Rapid Strep A Screen: NEGATIVE

## 2023-04-24 MED ORDER — AMOXICILLIN 500 MG PO CAPS: 500.0000 mg | ORAL_CAPSULE | Freq: Three times a day (TID) | ORAL | 0 refills | Status: DC

## 2023-04-24 NOTE — ED Provider Notes (Signed)
[facility]    CSN: [id number] Arrival date & time: 04/24/23  1154      History   Chief Complaint Chief Complaint  Patient presents with   Sore Throat    Ear pain, congestion - Entered by patient    HPI Erica Boyer is a 37 y.o. female.   Patient is [redacted] weeks pregnant.  She is doing well.  Scheduled for reduction tomorrow.  Both her daughter and husband are sick.  Husband was placed with antibiotics this morning for otitis.  She states she has a sore throat and ear pain right greater than left.  Symptoms been going on for 4 to 5 days.  Low-grade fever.  She has not taken any medication because of her pregnancy.    Past Medical History:  Diagnosis Date   Anxiety with depression 02/02/2021   Asthma    Fructose intolerance    Gastroesophageal reflux disease 11/13/2021   GERD (gastroesophageal reflux disease)    History of vitamin D deficiency 11/13/2021   Lactose intolerance    Migraines 03/29/2006   Supervision of normal pregnancy 09/28/2022              NURSING     PROVIDER      Office Location    [facility]    Dating by    LMP c/w U/S at 11 wks      [facility] Model    Traditional    Anatomy U/S           Initiated care at     [employer]     English                     LAB RESULTS       Support Person    Doug    Genetics    NIPS: low risk        AFP:                            NT/IT (FT only)                     Vaginal Pap smear, abnormal     Patient Active Problem List   Diagnosis Date Noted   Gestational hypertension, third trimester 04/12/2023   History of loop electrosurgical excision procedure (LEEP) of cervix affecting pregnancy in third trimester 04/12/2023   Fetal arrhythmia affecting pregnancy, antepartum 03/24/2023   Acute pansinusitis 02/28/2023   Polyhydramnios affecting pregnancy 02/25/2023   Large for gestational age fetus affecting management of mother 02/25/2023   AMA (advanced maternal age) multigravida 35+  12/13/2022   History of gestational hypertension 11/30/2022   BMI 40.0-44.9, adult (HCC) 11/30/2022   Obesity 10/05/2022   Pre-diabetes 10/01/2022   Supervision of normal pregnancy 09/28/2022   Asthma 11/27/2003    Past Surgical History:  Procedure Laterality Date   NO PAST SURGERIES      OB History     Gravida  4   Para  1   Term  1   Preterm      AB  2   Living  1      SAB  2   IAB      Ectopic      Multiple  Live Births  1            Home Medications    Prior to Admission medications   Medication Sig Start Date End Date Taking? Authorizing Provider  albuterol (VENTOLIN HFA) 108 (90 Base) MCG/ACT inhaler Inhale 2 puffs into the lungs every 4 (four) hours as needed for wheezing or shortness of breath. 08/06/22  Yes Wallis Bamberg, PA-C  amoxicillin (AMOXIL) 500 MG capsule Take 1 capsule (500 mg total) by mouth 3 (three) times daily. 04/24/23  Yes Eustace Moore, MD  cetirizine (ZYRTEC) 5 MG chewable tablet Chew by mouth.   Yes [provider]  cholecalciferol (VITAMIN D3) 25 MCG (1000 UNIT) tablet Take 1 tablet (1,000 Units total) by mouth daily. 11/13/21  Yes Christen Butter, NP  famotidine (PEPCID) 20 MG tablet Take by mouth. 03/24/23 03/23/24 Yes [provider]  fluticasone (FLONASE) 50 MCG/ACT nasal spray Place 2 sprays into both nostrils daily. 10/22/22  Yes Christen Butter, NP  magnesium oxide (MAG-OX) 400 MG tablet Take by mouth.   Yes [provider]  metoCLOPramide (REGLAN) 10 MG tablet Take 1 tablet (10 mg total) by mouth every 8 (eight) hours as needed for nausea. Ideally 30 minutes prior to meal and at bedtime 11/02/22  Yes Corlis Hove, NP  omeprazole (PRILOSEC) 40 MG capsule Take 1 capsule (40 mg total) by mouth daily. 11/13/21  Yes Christen Butter, NP  polyethylene glycol powder (GLYCOLAX/MIRALAX) 17 GM/SCOOP powder Take 17 g by mouth 2 (two) times daily as needed. 11/17/22  Yes Christen Butter, NP  Prenatal Vit-Fe Fumarate-FA  (PRENATAL VITAMIN PO) Take by mouth.   Yes [provider]  sotalol (BETAPACE) 80 MG tablet Take by mouth. 04/06/23 06/05/23 Yes [provider]    Family History Family History  Problem Relation Age of Onset   Hypertension Mother    Sleep apnea Mother    Hyperlipidemia Father    Heart failure Father    Esophageal cancer Father    Throat cancer Father    Diabetes Maternal Grandmother    Heart failure Paternal Grandfather     Social History Social History   Tobacco Use   Smoking status: Never    Passive exposure: Never   Smokeless tobacco: Never  Vaping Use   Vaping status: Never Used  Substance Use Topics   Alcohol use: Not Currently    Comment: socially   Drug use: Never     Allergies   Cephalexin, Mometasone, Moxifloxacin, Sumatriptan, Mometasone furoate, and Sumatriptan-naproxen sodium   Review of Systems Review of Systems See HPI  Physical Exam Triage Vital Signs ED Triage Vitals  Encounter Vitals Group     BP 04/24/23 1211 112/75     Systolic BP Percentile --      Diastolic BP Percentile --      Pulse Rate 04/24/23 1211 76     Resp 04/24/23 1211 18     Temp 04/24/23 1211 98.3 F (36.8 C)     Temp Source 04/24/23 1211 Oral     SpO2 04/24/23 1211 95 %     Weight 04/24/23 1213 230 lb (104.3 kg)     Height 04/24/23 1213 5\' 1"  (1.549 m)     Head Circumference --      Peak Flow --      Pain Score 04/24/23 1213 4     Pain Loc --      Pain Education --      Exclude from Growth Chart --  No data found.  Updated Vital Signs BP 112/75 (BP Location: Right Arm)   Pulse 76   Temp 98.3 F (36.8 C) (Oral)   Resp 18   Ht 5\' 1"  (1.549 m)   Wt 104.3 kg   LMP 07/13/2022   SpO2 95%   BMI 43.46 kg/m   Physical Exam Constitutional:      General: She is not in acute distress.    Appearance: She is well-developed. She is obese. She is ill-appearing.  HENT:     Head: Normocephalic and atraumatic.     Right Ear: No middle ear effusion.  Tympanic membrane is erythematous.     Left Ear: Tympanic membrane is not erythematous.     Nose: Congestion present. No rhinorrhea.     Mouth/Throat:     Mouth: Mucous membranes are moist.     Pharynx: Posterior oropharyngeal erythema present.     Tonsils: No tonsillar exudate. 2+ on the right. 2+ on the left.  Eyes:     Conjunctiva/sclera: Conjunctivae normal.     Pupils: Pupils are equal, round, and reactive to light.  Cardiovascular:     Rate and Rhythm: Normal rate and regular rhythm.     Heart sounds: Normal heart sounds.  Pulmonary:     Effort: Pulmonary effort is normal. No respiratory distress.     Breath sounds: Normal breath sounds.  Abdominal:     General: There is no distension.     Palpations: Abdomen is soft.     Comments: Large, gravid  Musculoskeletal:        General: Normal range of motion.     Cervical back: Normal range of motion.  Lymphadenopathy:     Cervical: Cervical adenopathy present.  Skin:    General: Skin is warm and dry.  Neurological:     Mental Status: She is alert.      UC Treatments / Results  Labs (all labs ordered are listed, but only abnormal results are displayed) Labs Reviewed  POCT RAPID STREP A (OFFICE)    EKG   Radiology No results found.  Procedures Procedures (including critical care time)  Medications Ordered in UC Medications - No data to display  Initial Impression / Assessment and Plan / UC Course  I have reviewed the triage vital signs and the nursing notes.  Pertinent labs & imaging results that were available during my care of the patient were reviewed by me and considered in my medical decision making (see chart for details).     Final Clinical Impressions(s) / UC Diagnoses   Final diagnoses:  [redacted] weeks gestation of pregnancy  Acute ear pain, bilateral  Acute tonsillitis, unspecified etiology     Discharge Instructions      Continue Flonase Drink lots of water Take amoxicillin as  directed   ED Prescriptions     Medication Sig Dispense Auth. Provider   amoxicillin (AMOXIL) 500 MG capsule Take 1 capsule (500 mg total) by mouth 3 (three) times daily. 21 capsule Eustace Moore, MD      PDMP not reviewed this encounter.   Eustace Moore, MD 04/24/23 [phone]

## 2023-04-24 NOTE — Discharge Instructions (Signed)
Continue Flonase Drink lots of water Take amoxicillin as directed

## 2023-04-24 NOTE — ED Triage Notes (Signed)
Patient c/o sore throat, bilateral ear pain and nasal drainage x 2 days.  Patient denies any OTC cold meds.

## 2023-04-25 ENCOUNTER — Other Ambulatory Visit: Payer: 59

## 2023-04-25 DIAGNOSIS — D6489 Other specified anemias: Secondary | ICD-10-CM | POA: Diagnosis not present

## 2023-04-25 DIAGNOSIS — O134 Gestational [pregnancy-induced] hypertension without significant proteinuria, complicating childbirth: Secondary | ICD-10-CM | POA: Diagnosis not present

## 2023-04-25 DIAGNOSIS — O9902 Anemia complicating childbirth: Secondary | ICD-10-CM | POA: Diagnosis not present

## 2023-04-25 DIAGNOSIS — Z9109 Other allergy status, other than to drugs and biological substances: Secondary | ICD-10-CM | POA: Diagnosis not present

## 2023-04-25 DIAGNOSIS — O43193 Other malformation of placenta, third trimester: Secondary | ICD-10-CM | POA: Diagnosis not present

## 2023-04-25 DIAGNOSIS — J4520 Mild intermittent asthma, uncomplicated: Secondary | ICD-10-CM | POA: Diagnosis not present

## 2023-04-25 DIAGNOSIS — O9952 Diseases of the respiratory system complicating childbirth: Secondary | ICD-10-CM | POA: Diagnosis not present

## 2023-04-25 DIAGNOSIS — Z79899 Other long term (current) drug therapy: Secondary | ICD-10-CM | POA: Diagnosis not present

## 2023-04-25 DIAGNOSIS — O3443 Maternal care for other abnormalities of cervix, third trimester: Secondary | ICD-10-CM | POA: Diagnosis not present

## 2023-04-25 DIAGNOSIS — Z3A37 37 weeks gestation of pregnancy: Secondary | ICD-10-CM | POA: Diagnosis not present

## 2023-04-25 DIAGNOSIS — Z9889 Other specified postprocedural states: Secondary | ICD-10-CM | POA: Diagnosis not present

## 2023-04-25 DIAGNOSIS — O99513 Diseases of the respiratory system complicating pregnancy, third trimester: Secondary | ICD-10-CM | POA: Diagnosis not present

## 2023-04-25 DIAGNOSIS — O133 Gestational [pregnancy-induced] hypertension without significant proteinuria, third trimester: Secondary | ICD-10-CM | POA: Diagnosis not present

## 2023-04-25 DIAGNOSIS — D649 Anemia, unspecified: Secondary | ICD-10-CM | POA: Diagnosis not present

## 2023-04-25 DIAGNOSIS — O368330 Maternal care for abnormalities of the fetal heart rate or rhythm, third trimester, not applicable or unspecified: Secondary | ICD-10-CM | POA: Diagnosis not present

## 2023-04-25 DIAGNOSIS — Z881 Allergy status to other antibiotic agents status: Secondary | ICD-10-CM | POA: Diagnosis not present

## 2023-04-25 DIAGNOSIS — J45909 Unspecified asthma, uncomplicated: Secondary | ICD-10-CM | POA: Diagnosis not present

## 2023-04-25 DIAGNOSIS — O368390 Maternal care for abnormalities of the fetal heart rate or rhythm, unspecified trimester, not applicable or unspecified: Secondary | ICD-10-CM | POA: Diagnosis not present

## 2023-04-25 DIAGNOSIS — O99344 Other mental disorders complicating childbirth: Secondary | ICD-10-CM | POA: Diagnosis not present

## 2023-04-25 LAB — STREP GP B CULTURE+RFLX: Strep Gp B Culture+Rflx: NEGATIVE

## 2023-04-26 DIAGNOSIS — J45909 Unspecified asthma, uncomplicated: Secondary | ICD-10-CM | POA: Diagnosis not present

## 2023-04-26 DIAGNOSIS — O368330 Maternal care for abnormalities of the fetal heart rate or rhythm, third trimester, not applicable or unspecified: Secondary | ICD-10-CM | POA: Diagnosis not present

## 2023-04-26 DIAGNOSIS — D6489 Other specified anemias: Secondary | ICD-10-CM | POA: Diagnosis not present

## 2023-04-26 DIAGNOSIS — O99344 Other mental disorders complicating childbirth: Secondary | ICD-10-CM | POA: Diagnosis not present

## 2023-04-26 DIAGNOSIS — O134 Gestational [pregnancy-induced] hypertension without significant proteinuria, complicating childbirth: Secondary | ICD-10-CM | POA: Diagnosis not present

## 2023-04-26 DIAGNOSIS — O9952 Diseases of the respiratory system complicating childbirth: Secondary | ICD-10-CM | POA: Diagnosis not present

## 2023-04-26 DIAGNOSIS — O43193 Other malformation of placenta, third trimester: Secondary | ICD-10-CM | POA: Diagnosis not present

## 2023-04-26 DIAGNOSIS — O9902 Anemia complicating childbirth: Secondary | ICD-10-CM | POA: Diagnosis not present

## 2023-04-26 DIAGNOSIS — Z3A37 37 weeks gestation of pregnancy: Secondary | ICD-10-CM | POA: Diagnosis not present

## 2023-04-27 ENCOUNTER — Telehealth: Payer: Self-pay | Admitting: *Deleted

## 2023-04-27 NOTE — Telephone Encounter (Signed)
Left patient a message with scheduled appointment information. 

## 2023-04-28 ENCOUNTER — Other Ambulatory Visit: Payer: 59

## 2023-05-02 ENCOUNTER — Other Ambulatory Visit: Payer: 59

## 2023-05-02 ENCOUNTER — Encounter: Payer: Self-pay | Admitting: Medical-Surgical

## 2023-05-03 ENCOUNTER — Ambulatory Visit: Payer: 59

## 2023-05-03 ENCOUNTER — Ambulatory Visit: Payer: 59 | Admitting: Medical-Surgical

## 2023-05-03 ENCOUNTER — Encounter: Payer: Self-pay | Admitting: Medical-Surgical

## 2023-05-03 VITALS — BP 119/80 | HR 75 | Resp 20 | Ht 61.0 in | Wt 222.0 lb

## 2023-05-03 DIAGNOSIS — R609 Edema, unspecified: Secondary | ICD-10-CM | POA: Diagnosis not present

## 2023-05-03 DIAGNOSIS — R2241 Localized swelling, mass and lump, right lower limb: Secondary | ICD-10-CM

## 2023-05-03 DIAGNOSIS — M79661 Pain in right lower leg: Secondary | ICD-10-CM

## 2023-05-03 DIAGNOSIS — M79604 Pain in right leg: Secondary | ICD-10-CM | POA: Diagnosis not present

## 2023-05-03 DIAGNOSIS — J0100 Acute maxillary sinusitis, unspecified: Secondary | ICD-10-CM

## 2023-05-03 DIAGNOSIS — R600 Localized edema: Secondary | ICD-10-CM | POA: Diagnosis not present

## 2023-05-03 MED ORDER — FLUCONAZOLE 150 MG PO TABS: 150.0000 mg | ORAL_TABLET | Freq: Once | ORAL | 0 refills | Status: AC

## 2023-05-03 MED ORDER — AMOXICILLIN-POT CLAVULANATE 875-125 MG PO TABS: 1.0000 | ORAL_TABLET | Freq: Two times a day (BID) | ORAL | 0 refills | Status: DC

## 2023-05-03 NOTE — Progress Notes (Signed)
Established patient visit  History, exam, impression, and plan:  1. Acute non-recurrent maxillary sinusitis Very pleasant 37 year old female presenting today with reports of upper respiratory symptoms for greater than 9 days.  She is 8 days postpartum and recovering well overall.  She was seen the day before her induction and prescribed amoxicillin 500 mg 3 times daily for 7 days.  Unfortunately this does not seem to help.  She continues to have symptoms as noted below.  She is currently breast-feeding which limits our ability to treat safely.  Plan to change to Augmentin twice daily x 10 days.  Adding Diflucan for VVC prophylaxis.  2. Localized swelling of right lower extremity Had some swelling of her bilateral lower extremities immediately after delivery which was to be expected.  Unfortunately, she has continued to have significant right lower extremity edema along with pain/discomfort, swelling of the calf, and some tenderness/pain in the calf area.  Plan for ultrasound to rule out DVT today.  Recommend elevation, compression socks, sodium restriction, and increasing fluid intake for conservative management. - US Venous Img Lower Unilateral Right (DVT); Future   Review of Systems  Constitutional:  Positive for malaise/fatigue. Negative for chills and fever.  HENT:  Positive for congestion, ear pain, sinus pain and sore throat. Negative for ear discharge.   Respiratory:  Positive for cough. Negative for shortness of breath and wheezing.   Cardiovascular:  Positive for leg swelling. Negative for chest pain and palpitations.  Neurological:  Positive for headaches.    Physical Exam Vitals reviewed.  Constitutional:      General: She is not in acute distress.    Appearance: Normal appearance. She is well-developed. She is obese. She is ill-appearing.  HENT:     Head: Normocephalic and atraumatic.     Right Ear: Ear canal normal. A middle ear effusion is present.     Left Ear: Ear  canal normal. A middle ear effusion is present.     Mouth/Throat:     Mouth: Mucous membranes are moist.     Pharynx: Pharyngeal swelling and posterior oropharyngeal erythema present. No oropharyngeal exudate.     Tonsils: No tonsillar exudate or tonsillar abscesses. 2+ on the right. 2+ on the left.  Eyes:     Conjunctiva/sclera: Conjunctivae normal.     Pupils: Pupils are equal, round, and reactive to light.  Neck:     Thyroid: No thyromegaly.  Cardiovascular:     Rate and Rhythm: Normal rate and regular rhythm.     Pulses: Normal pulses.     Heart sounds: Normal heart sounds. No murmur (.diagm) heard.    No friction rub. No gallop.  Pulmonary:     Effort: Pulmonary effort is normal. No respiratory distress.     Breath sounds: Normal breath sounds. No wheezing.  Musculoskeletal:     Cervical back: Normal range of motion and neck supple.  Lymphadenopathy:     Cervical: Cervical adenopathy present.  Skin:    General: Skin is warm and dry.  Neurological:     Mental Status: She is alert and oriented to person, place, and time.  Psychiatric:        Mood and Affect: Mood normal.        Behavior: Behavior normal.        Thought Content: Thought content normal.        Judgment: Judgment normal.   Procedures performed this visit: None.  Return if symptoms worsen or fail  to improve.  __________________________________ Thayer Ohm, DNP, APRN, FNP-BC Primary Care and Sports Medicine [facility]

## 2023-05-05 ENCOUNTER — Other Ambulatory Visit: Payer: 59

## 2023-05-19 ENCOUNTER — Encounter: Payer: Self-pay | Admitting: Medical-Surgical

## 2023-05-21 ENCOUNTER — Inpatient Hospital Stay
Admission: RE | Admit: 2023-05-21 | Discharge: 2023-05-21 | Discharge status: Home / Self Care | Payer: 59 | Source: Ambulatory Visit | Attending: Family Medicine

## 2023-05-21 ENCOUNTER — Other Ambulatory Visit: Payer: Self-pay

## 2023-05-21 VITALS — BP 138/84 | HR 95 | Temp 97.7°F | Resp 16

## 2023-05-21 DIAGNOSIS — J069 Acute upper respiratory infection, unspecified: Secondary | ICD-10-CM

## 2023-05-21 LAB — POC SARS CORONAVIRUS 2 AG -  ED: SARS Coronavirus 2 Ag: NEGATIVE

## 2023-05-21 LAB — POCT INFLUENZA A/B
Influenza A, POC: NEGATIVE
Influenza B, POC: NEGATIVE

## 2023-05-21 LAB — POCT RAPID STREP A (OFFICE): Rapid Strep A Screen: NEGATIVE

## 2023-05-21 NOTE — ED Triage Notes (Addendum)
Sore throat, body aches, headache, congestion since Wednesday. Has not had fever. Has been taking tylenol. Is breastfeeding.

## 2023-05-21 NOTE — Discharge Instructions (Signed)
May take Tylenol for pain and fever Make sure you are drinking lots of fluids  Check MyChart for your throat culture report.  You will be called if any change in treatment is needed

## 2023-05-21 NOTE — ED Provider Notes (Signed)
[facility]    CSN: [id number] Arrival date & time: 05/21/23  0947      History   Chief Complaint Chief Complaint  Patient presents with   Nasal Congestion    HPI Teruko TRISSA FANKHAUSER is a 37 y.o. female.   HPI  Leilany just had a baby less than a month ago.  Currently is here for illness.  She states that she has had a sore throat, fatigue congestion and headache.  She is here for testing to make sure she does not have contagious illness with her new baby.  Past Medical History:  Diagnosis Date   Anxiety with depression 02/02/2021   Asthma    Fructose intolerance    Gastroesophageal reflux disease 11/13/2021   GERD (gastroesophageal reflux disease)    History of vitamin D deficiency 11/13/2021   Lactose intolerance    Migraines 03/29/2006   Supervision of normal pregnancy 09/28/2022              NURSING     PROVIDER      Office Location    [facility]    Dating by    LMP c/w U/S at 11 wks      [facility] Model    Traditional    Anatomy U/S           Initiated care at     [employer]     English                     LAB RESULTS       Support Person    Doug    Genetics    NIPS: low risk        AFP:                            NT/IT (FT only)                     Vaginal Pap smear, abnormal     Patient Active Problem List   Diagnosis Date Noted   Gestational hypertension, third trimester 04/12/2023   History of loop electrosurgical excision procedure (LEEP) of cervix affecting pregnancy in third trimester 04/12/2023   Fetal arrhythmia affecting pregnancy, antepartum 03/24/2023   Acute pansinusitis 02/28/2023   Polyhydramnios affecting pregnancy 02/25/2023   Large for gestational age fetus affecting management of mother 02/25/2023   AMA (advanced maternal age) multigravida 35+ 12/13/2022   History of gestational hypertension 11/30/2022   BMI 40.0-44.9, adult (HCC) 11/30/2022   Obesity 10/05/2022   Pre-diabetes 10/01/2022   Supervision of  normal pregnancy 09/28/2022   Asthma 11/27/2003    Past Surgical History:  Procedure Laterality Date   NO PAST SURGERIES      OB History     Gravida  4   Para  1   Term  1   Preterm      AB  2   Living  1      SAB  2   IAB      Ectopic      Multiple      Live Births  1            Home Medications    Prior to Admission medications   Medication Sig Start Date End  Date Taking? Authorizing Provider  albuterol (VENTOLIN HFA) 108 (90 Base) MCG/ACT inhaler Inhale 2 puffs into the lungs every 4 (four) hours as needed for wheezing or shortness of breath. 08/06/22   Wallis Bamberg, PA-C  fluticasone (FLONASE) 50 MCG/ACT nasal spray Place 2 sprays into both nostrils daily. 10/22/22   Christen Butter, NP  Prenatal Vit-Fe Fumarate-FA (PRENATAL VITAMIN PO) Take by mouth.    [provider]    Family History Family History  Problem Relation Age of Onset   Hypertension Mother    Sleep apnea Mother    Hyperlipidemia Father    Heart failure Father    Esophageal cancer Father    Throat cancer Father    Diabetes Maternal Grandmother    Heart failure Paternal Grandfather     Social History Social History   Tobacco Use   Smoking status: Never    Passive exposure: Never   Smokeless tobacco: Never  Vaping Use   Vaping status: Never Used  Substance Use Topics   Alcohol use: Not Currently    Comment: socially   Drug use: Never     Allergies   Cephalexin, Mometasone, Moxifloxacin, Sumatriptan, Mometasone furoate, and Sumatriptan-naproxen sodium   Review of Systems Review of Systems See HPI  Physical Exam Triage Vital Signs ED Triage Vitals  Encounter Vitals Group     BP 05/21/23 0954 138/84     Systolic BP Percentile --      Diastolic BP Percentile --      Pulse Rate 05/21/23 0954 95     Resp 05/21/23 0954 16     Temp 05/21/23 0954 97.7 F (36.5 C)     Temp src --      SpO2 05/21/23 0954 97 %     Weight --      Height --      Head  Circumference --      Peak Flow --      Pain Score 05/21/23 0955 6     Pain Loc --      Pain Education --      Exclude from Growth Chart --    No data found.  Updated Vital Signs BP 138/84   Pulse 95   Temp 97.7 F (36.5 C)   Resp 16   LMP 07/13/2022   SpO2 97%      Physical Exam Constitutional:      General: She is not in acute distress.    Appearance: She is well-developed.  HENT:     Head: Normocephalic and atraumatic.     Nose: Nose normal.     Mouth/Throat:     Mouth: Mucous membranes are moist.     Comments: Tonsils are nonswollen.  Pink.  No exudate Eyes:     Conjunctiva/sclera: Conjunctivae normal.     Pupils: Pupils are equal, round, and reactive to light.  Cardiovascular:     Rate and Rhythm: Normal rate.  Pulmonary:     Effort: Pulmonary effort is normal. No respiratory distress.  Abdominal:     General: There is no distension.     Palpations: Abdomen is soft.  Musculoskeletal:        General: Normal range of motion.     Cervical back: Normal range of motion.  Lymphadenopathy:     Cervical: No cervical adenopathy.  Skin:    General: Skin is warm and dry.  Neurological:     Mental Status: She is alert.      UC Treatments / Results  Labs (all labs ordered are listed, but only abnormal results are displayed) Labs Reviewed  CULTURE, GROUP A STREP [facility])  POCT INFLUENZA A/B  POC SARS CORONAVIRUS 2 AG -  ED    EKG   Radiology No results found.  Procedures Procedures (including critical care time)  Medications Ordered in UC Medications - No data to display  Initial Impression / Assessment and Plan / UC Course  I have reviewed the triage vital signs and the nursing notes.  Pertinent labs & imaging results that were available during my care of the patient were reviewed by me and considered in my medical decision making (see chart for details).     Viral illness discussed Final Clinical Impressions(s) / UC Diagnoses   Final  diagnoses:  Viral upper respiratory tract infection     Discharge Instructions      May take Tylenol for pain and fever Make sure you are drinking lots of fluids  Check MyChart for your throat culture report.  You will be called if any change in treatment is needed     ED Prescriptions   None    PDMP not reviewed this encounter.   Eustace Moore, MD 05/21/23 [phone]

## 2023-05-24 ENCOUNTER — Ambulatory Visit: Payer: 59 | Admitting: Obstetrics and Gynecology

## 2023-05-24 ENCOUNTER — Encounter: Payer: Self-pay | Admitting: Obstetrics and Gynecology

## 2023-05-24 LAB — CULTURE, GROUP A STREP (THRC)

## 2023-05-24 NOTE — Progress Notes (Signed)
Post Partum Visit Note  Erica Boyer is a 37 y.o. G1P1021 female who presents for a postpartum visit. She is 4 weeks postpartum following a normal spontaneous vaginal delivery.  I have fully reviewed the prenatal and intrapartum course. The delivery was at 37 gestational weeks.  Anesthesia: epidural. Postpartum course has been unremarkable. Baby is doing well. Baby is feeding by both breast and bottle - [employer] Brand Sensitive Formula . Bleeding staining only. Bowel function is normal. Bladder function is normal. Patient is sexually active. Contraception method is none. Postpartum depression screening: negative.   The pregnancy intention screening data noted above was reviewed. Potential methods of contraception were discussed. The patient elected to proceed with No data recorded.   Edinburgh Postnatal Depression Scale - 05/24/23 0856       Edinburgh Postnatal Depression Scale:  In the Past 7 Days   I have been able to laugh and see the funny side of things. 0    I have looked forward with enjoyment to things. 0    I have blamed myself unnecessarily when things went wrong. 2    I have been anxious or worried for no good reason. 2    I have felt scared or panicky for no good reason. 1    Things have been getting on top of me. 1    I have been so unhappy that I have had difficulty sleeping. 0    I have felt sad or miserable. 1    I have been so unhappy that I have been crying. 0    The thought of harming myself has occurred to me. 0    Edinburgh Postnatal Depression Scale Total 7             There are no preventive care reminders to display for this patient.  The following portions of the patient's history were reviewed and updated as appropriate: allergies, current medications, past family history, past medical history, past social history, past surgical history, and problem list.  Review of Systems Pertinent items are noted in HPI.  Objective:  BP 128/88   Pulse 88    Ht 5\' 1"  (1.549 m)   Wt 209 lb (94.8 kg)   LMP 07/13/2022   BMI 39.49 kg/m     Breasts:  not indicated  Lungs: clear to auscultation bilaterally  Heart:  regular rate and rhythm, S1, S2 normal, no murmur, click, rub or gallop       Assessment:    Normal postpartum exam.  Baby is home and doing well Shaune delivered at [facility] d/t fetal SVT. She was on Sotalol while in patient. She developed GHTN and was induced at 37w.   Plan:   Essential components of care per ACOG recommendations:  1.  Mood and well being: Patient with negative depression screening today. Reviewed local resources for support.  - Patient tobacco use? No.   - hx of drug use? No.    2. Infant care and feeding:  -Patient currently breastmilk feeding? Yes. Reviewed importance of draining breast regularly to support lactation.  -Social determinants of health (SDOH) reviewed in EPIC. No concerns 3. Sexuality, contraception and birth spacing - Patient does want a pregnancy in the next year.  Desired family size is 3 children.  - Reviewed reproductive life planning. Reviewed contraceptive methods based on pt preferences and effectiveness.  Patient desired No Method - Other Reason today.   - Discussed birth spacing of 18 months  4. Sleep and  fatigue -Encouraged family/partner/community support of 4 hrs of uninterrupted sleep to help with mood and fatigue  5. Physical Recovery  - Discussed patients delivery and complications. She describes her labor as mixed. - Patient had a Vaginal problems after delivery including infant to NICU do to SVT. Marland Kitchen Patient had a  None  laceration. Perineal healing reviewed. Patient expressed understanding - Patient has urinary incontinence? No. - Patient is safe to resume physical and sexual activity  6.  Health Maintenance - HM due items addressed  - Last pap smear  Diagnosis  Date Value Ref Range Status  04/16/2022   Final   - Negative for intraepithelial lesion or malignancy (NILM)     7. Chronic Disease/Pregnancy Condition follow up: Prediabetes, patient to f/u with PCP  - PCP follow up  Venia Carbon, NP [facility]

## 2023-06-01 ENCOUNTER — Encounter: Payer: Self-pay | Admitting: Medical-Surgical

## 2023-06-03 ENCOUNTER — Ambulatory Visit (INDEPENDENT_AMBULATORY_CARE_PROVIDER_SITE_OTHER): Payer: 59 | Admitting: Medical-Surgical

## 2023-06-03 VITALS — BP 104/72 | HR 86 | Resp 20 | Ht 61.0 in | Wt 213.1 lb

## 2023-06-03 DIAGNOSIS — G43019 Migraine without aura, intractable, without status migrainosus: Secondary | ICD-10-CM | POA: Diagnosis not present

## 2023-06-03 MED ORDER — DEXAMETHASONE SODIUM PHOSPHATE 10 MG/ML IJ SOLN
10.0000 mg | Freq: Once | INTRAMUSCULAR | Status: AC
  Administered 2023-06-03: 10 mg via INTRAMUSCULAR

## 2023-06-03 MED ORDER — NURTEC 75 MG PO TBDP
1.0000 | ORAL_TABLET | ORAL | 1 refills | Status: DC
Start: 2023-06-03 — End: 2023-09-09

## 2023-06-03 MED ORDER — ONDANSETRON 4 MG PO TBDP
4.0000 mg | ORAL_TABLET | Freq: Three times a day (TID) | ORAL | 0 refills | Status: DC | PRN
Start: 2023-06-03 — End: 2023-08-05

## 2023-06-03 MED ORDER — KETOROLAC TROMETHAMINE 60 MG/2ML IM SOLN
60.0000 mg | Freq: Once | INTRAMUSCULAR | Status: AC
  Administered 2023-06-03: 60 mg via INTRAMUSCULAR

## 2023-06-03 MED ORDER — NORTRIPTYLINE HCL 10 MG PO CAPS: 10.0000 mg | ORAL_CAPSULE | Freq: Every day | ORAL | 1 refills | Status: DC

## 2023-06-03 NOTE — Progress Notes (Signed)
        Established patient visit  History, exam, impression, and plan:  1. Intractable migraine without aura and without status migrainosus (Primary) Pleasant 37 year old female presenting today for evaluation of an intractable migraine that has been occurring daily for the last 3 weeks.  Has a history of migraines that went away while pregnant and nursing however now that she is no longer nursing, they have come back.  She has photophobia, phonophobia, generalized head pressure, and mental fogginess.  Notes that she is fine at night however when she gets up and starts in the morning, she feels the headache come on and then it lasts all day.  Has tried Tylenol, Advil, and Excedrin Migraine without benefit. No neuro deficits or red flags. Treating with migraine cocktail today. Starting Nortriptyline 10mg  nightly for migraine prevention.  Imitrex in the past made her headaches worse so adding Nurtec 75 mg daily as needed for migraine abortion.  Sample given in office today.  If insurance does not cover this, we can consider trying Maxalt or Ubrelvy. - dexamethasone (DECADRON) injection 10 mg - ketorolac (TORADOL) injection 60 mg   Procedures performed this visit: None.  Return in about 4 weeks (around 07/01/2023) for migraine follow up.  __________________________________ Thayer Ohm, DNP, APRN, FNP-BC Primary Care and Sports Medicine [facility]

## 2023-06-09 ENCOUNTER — Encounter: Payer: Self-pay | Admitting: Medical-Surgical

## 2023-06-13 ENCOUNTER — Encounter: Payer: Self-pay | Admitting: Medical-Surgical

## 2023-06-14 MED ORDER — RIZATRIPTAN BENZOATE 10 MG PO TBDP: 10.0000 mg | ORAL_TABLET | ORAL | 3 refills | Status: AC | PRN

## 2023-07-01 ENCOUNTER — Ambulatory Visit: Payer: Commercial Managed Care - PPO | Admitting: Medical-Surgical

## 2023-07-08 ENCOUNTER — Encounter: Payer: Self-pay | Admitting: Medical-Surgical

## 2023-07-08 ENCOUNTER — Ambulatory Visit (INDEPENDENT_AMBULATORY_CARE_PROVIDER_SITE_OTHER): Payer: Commercial Managed Care - PPO | Admitting: Medical-Surgical

## 2023-07-08 ENCOUNTER — Other Ambulatory Visit: Payer: Self-pay | Admitting: Medical-Surgical

## 2023-07-08 VITALS — BP 123/83 | HR 97 | Resp 20 | Ht 61.0 in | Wt 211.1 lb

## 2023-07-08 DIAGNOSIS — R400 Somnolence: Secondary | ICD-10-CM

## 2023-07-08 DIAGNOSIS — Z6839 Body mass index (BMI) 39.0-39.9, adult: Secondary | ICD-10-CM

## 2023-07-08 DIAGNOSIS — E66812 Obesity, class 2: Secondary | ICD-10-CM | POA: Diagnosis not present

## 2023-07-08 DIAGNOSIS — R0683 Snoring: Secondary | ICD-10-CM | POA: Diagnosis not present

## 2023-07-08 DIAGNOSIS — G43019 Migraine without aura, intractable, without status migrainosus: Secondary | ICD-10-CM | POA: Diagnosis not present

## 2023-07-08 MED ORDER — ZEPBOUND 2.5 MG/0.5ML ~~LOC~~ SOAJ: 2.5000 mg | SUBCUTANEOUS | 0 refills | Status: DC

## 2023-07-08 MED ORDER — BACLOFEN 10 MG PO TABS
10.0000 mg | ORAL_TABLET | Freq: Three times a day (TID) | ORAL | 0 refills | Status: DC | PRN
Start: 2023-07-08 — End: 2024-02-03

## 2023-07-08 MED ORDER — NORTRIPTYLINE HCL 25 MG PO CAPS: 25.0000 mg | ORAL_CAPSULE | Freq: Every day | ORAL | 1 refills | Status: DC

## 2023-07-08 NOTE — Progress Notes (Unsigned)
        Established patient visit  History, exam, impression, and plan:  1. Intractable migraine without aura and without status migrainosus (Primary) Pleasant 38 year old female presenting today for follow up on migraines. She was seen in the office approximately 4 weeks ago for intractable migraine that was occurring every day without resolution. Was not having improvement with OTC medications. She was started on Nortriptyline 10mg  nightly and given Maxalt 10mg  daily prn for breakthrough headaches. Today, she reports no benefit with the Nortriptyline and that the maxalt does not work as well as hoped. Continues to have daily headaches. Does note frequent jaw clenching with bilateral pain at the TMJ and in the temples. Not sure if she grinds her teeth at night. No other neurological symptoms associated with headaches. Plan to increase Nortriptyline to 25mg  nightly. Adding Baclofen 10mg  TID prn as the jaw clenching may be a contributor to her headaches. Ok to continue Maxalt prn if needed. Plan for close follow up in 4 weeks.   2. Loud snoring 3. Daytime sleepiness Discussed AM headaches along with fatigue. StopBang criteria score of 4 which places her at intermediate risk. Plan for home sleep study to evaluate for sleep apnea.  STOP-BANG for SLEEP APNEA Do you Snore loudly? Yes Do you often feel Tired during day? Yes Has anyone Observed you stop breathing? No History of high blood Pressure? No BMI >35? Yes Age >50? No Neck circumference >16 in? Yes Gender female? No 5-8 = high risk 3-4 = intermediate 0-2 = low risk  - Home sleep test; Future  4. Class 2 severe obesity due to excess calories with serious comorbidity and body mass index (BMI) of 39.0 to 39.9 in adult [facility]) Discussed recommendations for exercise, portion control, and dietary modification. She is interested in starting a medication for weight loss, specifically a GLP-1. Discussed options and insurance limitations. She would  still like to try so sending in Zepbound 2.5mg  weekly today. Advised her to monitor her MyChart for information regarding PA submission and determination.   Procedures performed this visit: None.  Return in about 4 weeks (around 08/05/2023) for HA/migraine follow up.  __________________________________ Thayer Ohm, DNP, APRN, FNP-BC Primary Care and Sports Medicine [facility]

## 2023-07-09 ENCOUNTER — Encounter: Payer: Self-pay | Admitting: Medical-Surgical

## 2023-07-09 DIAGNOSIS — G43019 Migraine without aura, intractable, without status migrainosus: Secondary | ICD-10-CM | POA: Insufficient documentation

## 2023-07-12 DIAGNOSIS — H04123 Dry eye syndrome of bilateral lacrimal glands: Secondary | ICD-10-CM | POA: Diagnosis not present

## 2023-07-21 ENCOUNTER — Ambulatory Visit: Payer: Self-pay | Admitting: Medical-Surgical

## 2023-07-21 DIAGNOSIS — R5383 Other fatigue: Secondary | ICD-10-CM | POA: Diagnosis not present

## 2023-07-21 DIAGNOSIS — J45909 Unspecified asthma, uncomplicated: Secondary | ICD-10-CM | POA: Diagnosis not present

## 2023-07-21 DIAGNOSIS — N920 Excessive and frequent menstruation with regular cycle: Secondary | ICD-10-CM | POA: Diagnosis not present

## 2023-07-21 DIAGNOSIS — R1110 Vomiting, unspecified: Secondary | ICD-10-CM | POA: Diagnosis not present

## 2023-07-21 NOTE — Telephone Encounter (Addendum)
 Chief Complaint: Vaginal bleeding and pain Symptoms: heavy bleeding, abd pain and back pain Frequency: 3 days Pertinent Negatives: Patient denies n/a Disposition: [x] ED /[] Urgent Care (no appt availability in office) / [] Appointment(In office/virtual)/ []  [facility]/ [] Home Care/ [] Refused Recommended Disposition /[] [facility]/ []  Follow-up with PCP Additional Notes: Patient called in in tears stating she was experiencing her first menstrual cycle since giving birth on November 12 (vaginal delivery) and was in severe pain 9/10 with very heavy bleeding. Patient states she was soaking through a max strength pad every 4 hours and has had to switch over to her postpartum diapers for stronger control. Patient states she has been treating the pain with midol , heating pad and zofran  for the severe nausea. Patient states she is not currently taking any birth control at this time. Patient is also experiencing lightheadedness and dizziness upon standing. This RN advised patient to be seen at ED for further evaluation. Patient verbalized understanding and stated she will go to [facility] to be evaluated. Advised patient we will keep her appointment tomorrow for further follow up.   Copied from CRM [id number]. Topic: Clinical - Red Word Triage >> Jul 21, 2023 11:45 AM Georgia RAMAN wrote: Red Word that prompted transfer to Nurse Triage: Bleeding Reason for Disposition  SEVERE vaginal bleeding (e.g., soaking 2 pads or tampons per hour and present 2 or more hours; 1 menstrual cup every 2 hours)  Answer Assessment - Initial Assessment Questions 1. ONSET: When did the bleeding start? Describe your bleeding: is it getting worse, staying the same, improving, or stopping and starting?     2 days 2. AMOUNT: How much bleeding are you having today?     - SPOTTING: spotting, or pinkish / brownish mucous discharge; does not fill panty liner or pad    - MILD:  less than 1 pad / hour; less than patient's  usual menstrual bleeding   - MODERATE: 1-2 pads / hour; 1 menstrual cup every 6 hours; small-medium blood clots (e.g., pea, grape, small coin)   - SEVERE: soaking 2 or more pads/hour for 2 or more hours; 1 menstrual cup every 2 hours; bleeding not contained by pads or continuous red blood from vagina; large blood clots (e.g., golf ball, large coin)      1 pad every 4 hours 3. ABDOMEN PAIN: Do you have any pain? How bad is the pain? What does it keep you from doing?     - MILD -  doesn't interfere with normal activities, abdomen soft and not tender to touch     - MODERATE - interferes with normal activities or awakens from sleep, abdomen tender to touch     - SEVERE - excruciating pain, doubled over, unable to do any normal activities       9 4. HORMONES: Are you taking any birth control medicines? (e.g., birth control pills, Depo-Provera)     No 5. BLOOD THINNERS: Do you take any blood thinners? (e.g., aspirin, enoxaparin / Lovenox, warfarin / Coumadin)     No 6. OTHER SYMPTOMS: Do you have any other symptoms? (e.g., fever, chills, dizziness)     Lower back cramping, abdominal pain 7. DELIVERY DATE: When was your delivery date? Vaginal delivery or C-section?     Nov 12 - Vaginal 8. BREASTFEEDING: Are you breastfeeding?     No - stopped about a month ago due to being sick  Protocols used: Postpartum - Vaginal Bleeding and Lochia-A-AH

## 2023-07-22 ENCOUNTER — Encounter: Payer: Self-pay | Admitting: Medical-Surgical

## 2023-07-22 ENCOUNTER — Ambulatory Visit: Payer: Commercial Managed Care - PPO | Admitting: Medical-Surgical

## 2023-07-22 VITALS — BP 111/80 | HR 95 | Resp 20 | Ht 61.0 in | Wt 211.0 lb

## 2023-07-22 DIAGNOSIS — N920 Excessive and frequent menstruation with regular cycle: Secondary | ICD-10-CM | POA: Diagnosis not present

## 2023-07-22 MED ORDER — ETONOGESTREL-ETHINYL ESTRADIOL 0.12-0.015 MG/24HR VA RING: VAGINAL_RING | VAGINAL | 4 refills | Status: DC

## 2023-07-22 MED ORDER — TRANEXAMIC ACID 650 MG PO TABS: 1300.0000 mg | ORAL_TABLET | Freq: Three times a day (TID) | ORAL | 0 refills | Status: DC

## 2023-07-22 NOTE — Patient Instructions (Signed)
 Erica Boyer

## 2023-07-22 NOTE — Progress Notes (Signed)
        Established patient visit  History, exam, impression, and plan:  1. Menorrhagia with regular cycle (Primary) Pleasant 38 year old female presenting today with reports of having her first menstrual cycle since delivering her newborn.  Notes that her bleeding started on Tuesday and has been extremely heavy requiring her to go through extra heavy pads every 1-2 hours.  She has had horrible cramps as well as nausea.  Having difficulty getting up and functioning due to pain and discomfort.  She has been very fatigued but having difficulty getting comfortable to rest due to the pain.  She has been alternating Advil  800 mg with Motrin  which previously worked well for her.  At this point, it has provided no benefit.  Using a heating pad which is only temporarily helpful.  She called her OB/GYN and was told there was nothing that they could do for her.  When she called our office, the triage nurse spoke with her and advised her to go to the ED.  She went to the ED yesterday and after waiting for most of the day, finally had labs drawn.  Her hemoglobin was stable at 12.5 and she was advised that there was nothing the emergency room could do for her symptoms either.  Today she reports here hoping that we will be able to do something for her.  After discussion, she had a similar problem after she delivered her daughter.  When her menses came back, she had similar cramping with heavy bleeding until her body settled.  She used NuvaRing for contraception and menstrual management before which worked well and is interested in resuming.  Plan to restart NuvaRing.  Adding Lysteda  1300 mg 3 times daily as needed for heavy menstrual bleeding and cramping.  Advised to only use this during her menstrual period as it does run a higher risk of clotting than typical ibuprofen  or Advil .  Of note: She does report having some mental health concerns with some depression but constant anxiety.  She was previously on antidepressants  but is hopeful not to have to go back on them.  Notes that the recent menstrual issues and intractable headaches have served to worsen her symptoms.  Briefly discussed going back on medication but ultimately we decided to hold off on any interventions today outside of working to manage her migraines as well as her menstrual symptoms.  When we checked back in to follow-up on NuvaRing, we will certainly discuss the need for further intervention.   Procedures performed this visit: None.  Return in about 6 weeks (around 09/02/2023) for Birth control/mood follow-up.  __________________________________ Erica FREDRIK Palin, DNP, APRN, FNP-BC Primary Care and Sports Medicine [facility] [place]

## 2023-08-02 ENCOUNTER — Other Ambulatory Visit: Payer: Self-pay | Admitting: Medical-Surgical

## 2023-08-05 ENCOUNTER — Ambulatory Visit: Payer: Commercial Managed Care - PPO | Admitting: Medical-Surgical

## 2023-08-05 ENCOUNTER — Encounter: Payer: Self-pay | Admitting: Medical-Surgical

## 2023-08-05 ENCOUNTER — Ambulatory Visit (HOSPITAL_BASED_OUTPATIENT_CLINIC_OR_DEPARTMENT_OTHER)
Admission: RE | Admit: 2023-08-05 | Discharge: 2023-08-05 | Disposition: A | Discharge status: Home / Self Care | Payer: Commercial Managed Care - PPO | Source: Ambulatory Visit | Attending: Medical-Surgical | Admitting: Medical-Surgical

## 2023-08-05 VITALS — BP 115/77 | HR 79 | Resp 20 | Ht 61.0 in | Wt 207.4 lb

## 2023-08-05 DIAGNOSIS — R110 Nausea: Secondary | ICD-10-CM

## 2023-08-05 DIAGNOSIS — G43019 Migraine without aura, intractable, without status migrainosus: Secondary | ICD-10-CM

## 2023-08-05 DIAGNOSIS — R1031 Right lower quadrant pain: Secondary | ICD-10-CM

## 2023-08-05 MED ORDER — PANTOPRAZOLE SODIUM 40 MG PO TBEC: 40.0000 mg | DELAYED_RELEASE_TABLET | Freq: Every day | ORAL | 3 refills | Status: AC

## 2023-08-05 MED ORDER — ONDANSETRON 4 MG PO TBDP: 4.0000 mg | ORAL_TABLET | Freq: Three times a day (TID) | ORAL | 3 refills | Status: AC | PRN

## 2023-08-05 NOTE — Progress Notes (Signed)
        Established patient visit  History, exam, impression, and plan:  1. Intractable migraine without aura and without status migrainosus (Primary) Very pleasant 38 year old female presenting today for follow-up on intractable migraines.  She was started on nortriptyline 25 mg daily with the use of rizatriptan and baclofen for breakthrough migraines.  Notes that the nortriptyline has been working well.  She has only had 1 recent migraine that was a couple of days ago.  It was 7-8/10 and lasted for several hours.  She took the Maxalt/baclofen which was very helpful for her.  Using Zofran as needed nausea.  Feels that the medications are working very well for her and does not desire a medication change at this time.  Plan to continue nortriptyline, rizatriptan, and baclofen as prescribed.  2. Right lower quadrant abdominal pain On evaluation today, she describes some abdominal pain.  She has been having some left upper abdominal pain that is accompanied by nausea but also endorses some chills and a desire to vomit but being unable to.  On exam, abdomen is soft, nondistended.  She has mild tenderness to the right upper quadrant, worse on deep breathing.  She has worsening tenderness to the left upper quadrant and epigastric region.  Mild left lower quadrant tenderness.  Right lower quadrant was exquisitely tender on palpation with positive psoas sign.  Pain reproducible with jumping in place.  Bowel sounds positive x 4 quadrants.  Notes regular bowel movements with no concern for melena, hematochezia, constipation, or fecal obstruction.  Concern for acute appendicitis.  Stat CT abdomen pelvis today.  Getting labs as below. - CT ABDOMEN PELVIS WO CONTRAST; Future - CBC with Differential/Platelet - CMP14+EGFR - Amylase - Lipase - Sed Rate (ESR)  3. Nausea She does have some nausea that accompanies the left upper quadrant abdominal pain.  She is using famotidine 20 mg twice daily as needed which helps  with reflux however if she misses a single dose, she is quite miserable in the evening.  Plan for H. pylori breath test to rule out H. pylori infection.  Adding Protonix 40 mg daily.  Okay to continue Pepcid as prescribed. - H. pylori breath test  Procedures performed this visit: None.  Return in about 4 weeks (around 09/02/2023) for Abdominal pain follow-up.  __________________________________ Thayer Ohm, DNP, APRN, FNP-BC Primary Care and Sports Medicine [facility]

## 2023-08-06 ENCOUNTER — Encounter: Payer: Self-pay | Admitting: Medical-Surgical

## 2023-08-07 LAB — CMP14+EGFR
ALT: 50 [IU]/L — ABNORMAL HIGH (ref 0–32)
AST: 33 [IU]/L (ref 0–40)
Albumin: 4.7 g/dL (ref 3.9–4.9)
Alkaline Phosphatase: 101 [IU]/L (ref 44–121)
BUN/Creatinine Ratio: 14 (ref 9–23)
BUN: 10 mg/dL (ref 6–20)
Bilirubin Total: <0.2 mg/dL (ref 0.0–1.2)
CO2: 20 mmol/L (ref 20–29)
Calcium: 10 mg/dL (ref 8.7–10.2)
Chloride: 104 mmol/L (ref 96–106)
Creatinine, Ser: 0.69 mg/dL (ref 0.57–1.00)
Globulin, Total: 2.5 g/dL (ref 1.5–4.5)
Glucose: 92 mg/dL (ref 70–99)
Potassium: 4.5 mmol/L (ref 3.5–5.2)
Sodium: 141 mmol/L (ref 134–144)
Total Protein: 7.2 g/dL (ref 6.0–8.5)
eGFR: 115 mL/min/{1.73_m2} (ref >59)

## 2023-08-07 LAB — CBC WITH DIFFERENTIAL/PLATELET
Basophils Absolute: 0.1 10*3/uL (ref 0.0–0.2)
Basos: 1 %
EOS (ABSOLUTE): 0.1 10*3/uL (ref 0.0–0.4)
Eos: 2 %
Hematocrit: 37.2 % (ref 34.0–46.6)
Hemoglobin: 12.2 g/dL (ref 11.1–15.9)
Immature Grans (Abs): 0 10*3/uL (ref 0.0–0.1)
Immature Granulocytes: 0 %
Lymphocytes Absolute: 2.8 10*3/uL (ref 0.7–3.1)
Lymphs: 38 %
MCH: 29.5 pg (ref 26.6–33.0)
MCHC: 32.8 g/dL (ref 31.5–35.7)
MCV: 90 fL (ref 79–97)
Monocytes Absolute: 0.6 10*3/uL (ref 0.1–0.9)
Monocytes: 8 %
Neutrophils Absolute: 3.8 10*3/uL (ref 1.4–7.0)
Neutrophils: 51 %
Platelets: 395 10*3/uL (ref 150–450)
RBC: 4.13 x10E6/uL (ref 3.77–5.28)
RDW: 11.8 % (ref 11.7–15.4)
WBC: 7.3 10*3/uL (ref 3.4–10.8)

## 2023-08-07 LAB — LIPASE: Lipase: 43 U/L (ref 14–72)

## 2023-08-07 LAB — SEDIMENTATION RATE: Sed Rate: 62 mm/h — ABNORMAL HIGH (ref 0–32)

## 2023-08-07 LAB — AMYLASE: Amylase: 59 U/L (ref 31–110)

## 2023-08-07 LAB — H. PYLORI BREATH TEST

## 2023-08-10 ENCOUNTER — Encounter: Payer: Self-pay | Admitting: Medical-Surgical

## 2023-08-17 ENCOUNTER — Encounter: Payer: Self-pay | Admitting: Physician Assistant

## 2023-08-17 ENCOUNTER — Ambulatory Visit: Admitting: Physician Assistant

## 2023-08-17 VITALS — BP 137/81 | HR 94 | Ht 61.0 in | Wt 204.8 lb

## 2023-08-17 DIAGNOSIS — L03032 Cellulitis of left toe: Secondary | ICD-10-CM | POA: Diagnosis not present

## 2023-08-17 MED ORDER — DOXYCYCLINE HYCLATE 100 MG PO TABS: 100.0000 mg | ORAL_TABLET | Freq: Two times a day (BID) | ORAL | 0 refills | Status: DC

## 2023-08-17 NOTE — Patient Instructions (Addendum)
 Paronychia Paronychia is an infection of the skin that surrounds a nail. It usually affects the skin around a fingernail, but it may also occur near a toenail. It often causes pain and swelling around the nail. In some cases, a collection of pus (abscess) can form near or under the nail.  This condition may develop suddenly, or it may develop gradually over a longer period. In most cases, paronychia is not serious, and it will clear up with treatment. What are the causes? This condition may be caused by bacteria or a fungus, such as yeast. The bacteria or fungus can enter the body through an opening in the skin, such as a cut or a hangnail, and cause an infection in your fingernail or toenail. Other causes may include: Recurrent injury to the fingernail or toenail area. Irritation of the base and sides of the nail (cuticle). Injury and irritation can result in inflammation, swelling, and thickened skin around the nail. What increases the risk? This condition is more likely to develop in people who: Get their hands wet often, such as those who work as Fish farm manager, bartenders, or housekeepers. Bite their fingernails or cuticles. Have underlying skin conditions. Have hangnails or injured fingertips. Are exposed to irritants like detergents and other chemicals. Have diabetes. What are the signs or symptoms? Symptoms of this condition include: Redness and swelling of the skin near the nail. Tenderness around the nail when you touch the area. Pus-filled bumps under the cuticle. Fluid or pus under the nail. Throbbing pain in the area. How is this diagnosed? This condition is diagnosed with a physical exam. In some cases, a sample of pus may be tested to determine what type of bacteria or fungus is causing the condition. How is this treated? Treatment depends on the cause and severity of your condition. If your condition is mild, it may clear up on its own in a few days or after soaking in warm  water. If needed, treatment may include: Antibiotic medicine, if your infection is caused by bacteria. Antifungal medicine, if your infection is caused by a fungus. A procedure to drain pus from an abscess. Anti-inflammatory medicine (corticosteroids). Removal of part of an ingrown toenail. A bandage (dressing) may be placed over the affected area if an abscess or part of a nail has been removed. Follow these instructions at home: Wound care Keep the affected area clean. Soak the affected area in warm water if told to do so by your health care provider. You may be told to do this for 20 minutes, 2-3 times a day. Keep the area dry when you are not soaking it. Do not try to drain an abscess yourself. Follow instructions from your health care provider about how to take care of the affected area. Make sure you: Wash your hands with soap and water for at least 20 seconds before and after you change your dressing. If soap and water are not available, use hand sanitizer. Change your dressing as told by your health care provider. If you had an abscess drained, check the area every day for signs of infection. Check for: Redness, swelling, or pain. Fluid or blood. Warmth. Pus or a bad smell. Medicines  Take over-the-counter and prescription medicines only as told by your health care provider. If you were prescribed an antibiotic medicine, take it as told by your health care provider. Do not stop taking the antibiotic even if you start to feel better. General instructions Avoid contact with any skin irritants or allergens.  Do not pick at the affected area. Keep all follow-up visits as told. This is important. Prevention To prevent this condition from happening again: Wear rubber gloves when washing dishes or doing other tasks that require your hands to get wet. Wear gloves if your hands might come in contact with cleaners or other chemicals. Avoid injuring your nails or fingertips. Do not bite  your nails or tear hangnails. Do not cut your nails very short. Do not cut your cuticles. Use clean nail clippers or scissors when trimming nails. Contact a health care provider if: Your symptoms get worse or do not improve with treatment. You have continued or increased fluid, blood, or pus coming from the affected area. Your affected finger, toe, or joint becomes swollen or difficult to move. You have a fever or chills. There is redness spreading away from the affected area. Summary Paronychia is an infection of the skin that surrounds a nail. It often causes pain and swelling around the nail. In some cases, a collection of pus (abscess) can form near or under the nail. This condition may be caused by bacteria or a fungus. These germs can enter the body through an opening in the skin, such as a cut or a hangnail. If your condition is mild, it may clear up on its own in a few days. If needed, treatment may include medicine or a procedure to drain pus from an abscess. To prevent this condition from happening again, wear gloves if doing tasks that require your hands to get wet or to come in contact with chemicals. Also avoid injuring your nails or fingertips. This information is not intended to replace advice given to you by your health care provider. Make sure you discuss any questions you have with your health care provider. Document Revised: 09/01/2020 Document Reviewed: 09/01/2020 Elsevier Patient Education  2024 [employer] Ingrown Toenail  An ingrown toenail occurs when the corner or sides of a toenail grow into the surrounding skin. This causes discomfort and pain. The big toe is most commonly affected, but any of the toes can be affected. If an ingrown toenail is not treated, it can become infected. What are the causes? This condition may be caused by: Wearing shoes that are too small or tight. An injury, such as stubbing your toe or having your toe stepped on. Improper cutting or  care of your toenails. Having nail or foot abnormalities that were present from birth (congenital abnormalities), such as having a nail that is too big for your toe. What increases the risk? The following factors may make you more likely to develop ingrown toenails: Age. Nails tend to get thicker with age, so ingrown nails are more common among older people. Cutting your toenails incorrectly, such as cutting them very short or cutting them unevenly. An ingrown toenail is more likely to get infected if you have: Diabetes. Blood flow (circulation) problems. What are the signs or symptoms? Symptoms of an ingrown toenail may include: Pain, soreness, or tenderness. Redness. Swelling. Hardening of the skin that surrounds the toenail. Signs that an ingrown toenail may be infected include: Fluid or pus. Symptoms that get worse. How is this diagnosed? Ingrown toenails may be diagnosed based on: Your symptoms and medical history. A physical exam. Labs or tests. If you have fluid or blood coming from your toenail, a sample may be collected to test for the specific type of bacteria that is causing the infection. How is this treated? Treatment depends on the severity of  your symptoms. You may be able to care for your toenail at home. If you have an infection, you may be prescribed antibiotic medicines. If you have fluid or pus draining from your toenail, your health care provider may drain it. If you have trouble walking, you may be given crutches to use. If you have a severe or infected ingrown toenail, you may need a procedure to remove part or all of the nail. Follow these instructions at home: Foot care  Check your wound every day for signs of infection, or as often as told by your health care provider. Check for: More redness, swelling, or pain. More fluid or blood. Warmth. Pus or a bad smell. Do not pick at your toenail or try to remove it yourself. Soak your foot in warm, soapy water.  Do this for 20 minutes, 3 times a day, or as often as told by your health care provider. This helps to keep your toe clean and your skin soft. Wear shoes that fit well and are not too tight. Your health care provider may recommend that you wear open-toed shoes while you heal. Trim your toenails regularly and carefully. Cut your toenails straight across to prevent injury to the skin at the corners of the toenail. Do not cut your nails in a curved shape. Keep your feet clean and dry to help prevent infection. General instructions Take over-the-counter and prescription medicines only as told by your health care provider. If you were prescribed an antibiotic, take it as told by your health care provider. Do not stop taking the antibiotic even if you start to feel better. If your health care provider told you to use crutches to help you move around, use them as instructed. Return to your normal activities as told by your health care provider. Ask your health care provider what activities are safe for you. Keep all follow-up visits. This is important. Contact a health care provider if: You have more redness, swelling, pain, or other symptoms that do not improve with treatment. You have fluid, blood, or pus coming from your toenail. You have a red streak on your skin that starts at your foot and spreads up your leg. You have a fever. Summary An ingrown toenail occurs when the corner or sides of a toenail grow into the surrounding skin. This causes discomfort and pain. The big toe is most commonly affected, but any of the toes can be affected. If an ingrown toenail is not treated, it can become infected. Fluid or pus draining from your toenail is a sign of infection. Your health care provider may need to drain it. You may be given antibiotics to treat the infection. Trimming your toenails regularly and properly can help you prevent an ingrown toenail. This information is not intended to replace advice  given to you by your health care provider. Make sure you discuss any questions you have with your health care provider. Document Revised: 09/30/2020 Document Reviewed: 09/30/2020 Elsevier Patient Education  2024 [employer].

## 2023-08-17 NOTE — Progress Notes (Signed)
 Acute Office Visit  Subjective:     Patient ID: Erica Boyer, female    DOB: 1986/04/06, 38 y.o.   MRN: [id number]  CC: ingrown toe nail   HPI Patient is a 38 yo female who presents with a chief complaint of an ingrown toe nail on the left great toe with swelling, redness and pain since Saturday, 5 days. She has had ingrown toenails before. She has a history of hang nails in both toes. Denies fever, chills, or trauma to the area.   .. Active Ambulatory Problems    Diagnosis Date Noted   Asthma 11/27/2003   Pre-diabetes 10/01/2022   Obesity 10/05/2022   BMI 40.0-44.9, adult (HCC) 11/30/2022   Intractable migraine without aura and without status migrainosus 07/09/2023   Paronychia of great toe of left foot 08/19/2023   Resolved Ambulatory Problems    Diagnosis Date Noted   Migraines 03/29/2006   Anxiety with depression 02/02/2021   Patellofemoral syndrome, left 03/09/2021   Left ankle sprain 03/09/2021   Seasonal allergies 11/13/2021   Gastroesophageal reflux disease 11/13/2021   History of vitamin D deficiency 11/13/2021   Left cervical radiculopathy 07/22/2022   Pregnant 09/13/2022   Supervision of normal pregnancy 09/28/2022   Anemia 10/05/2022   History of gestational hypertension 11/30/2022   AMA (advanced maternal age) multigravida 35+ 12/13/2022   Polyhydramnios affecting pregnancy 02/25/2023   Large for gestational age fetus affecting management of mother 02/25/2023   Acute pansinusitis 02/28/2023   Fetal arrhythmia affecting pregnancy, antepartum 03/24/2023   Gestational hypertension, third trimester 04/12/2023   History of loop electrosurgical excision procedure (LEEP) of cervix affecting pregnancy in third trimester 04/12/2023   Past Medical History:  Diagnosis Date   Fructose intolerance    GERD (gastroesophageal reflux disease)    Lactose intolerance    Vaginal Pap smear, abnormal     ROS See HPI     Objective:    BP 137/81 (BP Location: Left  Arm, Patient Position: Sitting, Cuff Size: Large)   Pulse 94   Ht 5\' 1"  (1.549 m)   Wt 204 lb 12 oz (92.9 kg)   LMP  (LMP Unknown)   SpO2 97%   BMI 38.69 kg/m  BP Readings from Last 3 Encounters:  08/17/23 137/81  08/05/23 115/77  07/22/23 111/80   Wt Readings from Last 3 Encounters:  08/17/23 204 lb 12 oz (92.9 kg)  08/05/23 207 lb 6.4 oz (94.1 kg)  07/22/23 211 lb 0.6 oz (95.7 kg)   SpO2 Readings from Last 3 Encounters:  08/17/23 97%  08/05/23 98%  07/22/23 98%      Physical Exam Constitutional:      Appearance: Normal appearance.  HENT:     Head: Normocephalic and atraumatic.  Cardiovascular:     Rate and Rhythm: Regular rhythm.     Pulses: Normal pulses.     Heart sounds: Normal heart sounds.  Pulmonary:     Effort: Pulmonary effort is normal.     Breath sounds: Normal breath sounds.  Feet:     Left foot:     Toenail Condition: Left toenails are ingrown.     Comments: Ingrown toe nail on left first metatarsal Erythematous and tender to palpation on the lateral side of left toenail   Skin:    General: Skin is warm.  Neurological:     Mental Status: She is alert.       Assessment & Plan:  Marland KitchenMarland KitchenChrista was seen today for ingrown toenail.  Diagnoses  and all orders for this visit:  Paronychia of great toe of left foot -     doxycycline (VIBRA-TABS) 100 MG tablet; Take 1 tablet (100 mg total) by mouth 2 (two) times daily.    - Start doxycycline for infection - Epison salt soaks - Educated on properly cutting her toe nails - Educated patient on signs and symptoms of worsening infection  - If pain and inflammation continues come back for removal.    Tandy Gaw, PA-C

## 2023-08-19 ENCOUNTER — Encounter: Payer: Self-pay | Admitting: Physician Assistant

## 2023-08-19 DIAGNOSIS — L03032 Cellulitis of left toe: Secondary | ICD-10-CM | POA: Insufficient documentation

## 2023-09-02 ENCOUNTER — Telehealth: Payer: Commercial Managed Care - PPO | Admitting: Medical-Surgical

## 2023-09-09 ENCOUNTER — Ambulatory Visit: Payer: Commercial Managed Care - PPO | Admitting: Medical-Surgical

## 2023-09-09 ENCOUNTER — Encounter: Payer: Self-pay | Admitting: Medical-Surgical

## 2023-09-09 VITALS — BP 113/75 | HR 89 | Resp 20 | Ht 61.0 in | Wt 205.2 lb

## 2023-09-09 DIAGNOSIS — R1012 Left upper quadrant pain: Secondary | ICD-10-CM | POA: Diagnosis not present

## 2023-09-09 DIAGNOSIS — Z630 Problems in relationship with spouse or partner: Secondary | ICD-10-CM | POA: Diagnosis not present

## 2023-09-09 DIAGNOSIS — R110 Nausea: Secondary | ICD-10-CM

## 2023-09-09 MED ORDER — DICYCLOMINE HCL 10 MG PO CAPS: 10.0000 mg | ORAL_CAPSULE | Freq: Three times a day (TID) | ORAL | 1 refills | Status: AC

## 2023-09-09 MED ORDER — ESCITALOPRAM OXALATE 10 MG PO TABS: 10.0000 mg | ORAL_TABLET | Freq: Every day | ORAL | 3 refills | Status: DC

## 2023-09-09 NOTE — Progress Notes (Signed)
        Established patient visit  History, exam, impression, and plan:  1. Nausea (Primary) 2. LUQ abdominal pain Pleasant 38 year old female presenting for follow up on upper abdominal pain. She was started on Protonix 40mg  daily about 4 weeks ago. Has been taking this as prescribed, tolerating well without SE. Also taking famotidine 20mg  twice daily. Feels that the medications have been helpful but the symptoms tend to come and go depending on the day. Feels that the pain is somewhat cramping in nature. Happens randomly and has not been associated with foods or activities. Having normal BMs 1-2 times daily. Admits that she wonders if anxiety may be contributing. For now, continue famotidine and Protonix. Adding Bentyl 10-20mg  up to four times daily as needed. See below for mood mgmt which may help with symptoms as well.   3. Marital stress Has been under quite a bit of stress lately which compounds baseline anxiety and depression. Has a young child at home and is around 5 months postpartum. She reports she and her husband are having problems and they are now separated. She doesn't think this separation is going to be reconcilable. Has taken Lexapro in the past which was helpful for her anxiety and depression. Interested in getting restarted. Start Lexapro 5mg  daily for 7 days then increase to 10mg  daily. Referring to behavioral health for counseling.  - Ambulatory referral to Behavioral Health   Procedures performed this visit: None.  Return for mood follow up in 4-6 weeks.  __________________________________ Thayer Ohm, DNP, APRN, FNP-BC Primary Care and Sports Medicine [facility]

## 2023-09-21 ENCOUNTER — Other Ambulatory Visit: Payer: Self-pay

## 2023-09-22 ENCOUNTER — Ambulatory Visit: Payer: Self-pay | Admitting: Podiatry

## 2023-09-22 ENCOUNTER — Encounter: Payer: Self-pay | Admitting: Podiatry

## 2023-09-22 DIAGNOSIS — L600 Ingrowing nail: Secondary | ICD-10-CM

## 2023-09-22 NOTE — Progress Notes (Signed)
 Subjective:  Patient ID: Erica Boyer, female    DOB: 1986-04-25,   MRN: [id number]  No chief complaint on file.   38 y.o. female presents for new concern of left great toe ingrown toenail . Relates it started bothering her the beginning of march. She has been trying soaks and neosporin and maybe got a bit better but has bene very sore and is ready to be done with the ingrown.  Denies any other pedal complaints. Denies n/v/f/c.   Past Medical History:  Diagnosis Date   AMA (advanced maternal age) multigravida 35+ 12/13/2022   Anxiety with depression 02/02/2021   Asthma    Fetal arrhythmia affecting pregnancy, antepartum 03/24/2023   On Digoxin > has correct fetal arrhythmia for now  Twice weekly NST at [facility]  Plan is for delivery at [facility] with [facility] to continue there.      Fructose intolerance    Gastroesophageal reflux disease 11/13/2021   GERD (gastroesophageal reflux disease)    Gestational hypertension, third trimester 04/12/2023   History of gestational hypertension 11/30/2022   History of loop electrosurgical excision procedure (LEEP) of cervix affecting pregnancy in third trimester 04/12/2023   History of vitamin D deficiency 11/13/2021   Lactose intolerance    Large for gestational age fetus affecting management of mother 02/25/2023   Migraines 03/29/2006   Polyhydramnios affecting pregnancy 02/25/2023   Supervision of normal pregnancy 09/28/2022              NURSING     PROVIDER      Office Location    [place]    Dating by    LMP c/w U/S at 11 wks      [facility] Model    Traditional    Anatomy U/S           Initiated care at     [employer]     English                     LAB RESULTS       Support Person    Doug    Genetics    NIPS: low risk        AFP:                            NT/IT (FT only)                     Vaginal Pap smear, abnormal     Objective:  Physical Exam: Vascular: DP/PT pulses 2/4 bilateral. CFT <3 seconds. Normal hair growth on  digits. No edema.  Skin. No lacerations or abrasions bilateral feet. Incurvation of lateral border of left hallux with mild edema noted .No erythema or purulence noted. Tender to palpation Musculoskeletal: MMT 5/5 bilateral lower extremities in DF, PF, Inversion and Eversion. Deceased ROM in DF of ankle joint.  Neurological: Sensation intact to light touch.   Assessment:   1. Ingrown left greater toenail      Plan:  Patient was evaluated and treated and all questions answered. Discussed ingrown toenails etiology and treatment options including procedure for removal vs conservative care.  Patient requesting removal of ingrown nail today. Procedure below.  Discussed procedure and post procedure care and patient expressed understanding.  Will follow-up in 2  weeks for nail check or sooner if any problems arise.    Procedure:  Procedure: partial Nail Avulsion of left hallux lateral nail border.  Surgeon: Louann Sjogren, DPM  Pre-op Dx: Ingrown toenail without infection Post-op: Same  Place of Surgery: Office exam room.  Indications for surgery: Painful and ingrown toenail.    The patient is requesting removal of nail with  chemical matrixectomy. Risks and complications were discussed with the patient for which they understand and written consent was obtained. Under sterile conditions a total of 3 mL of  1% lidocaine plain was infiltrated in a hallux block fashion. Once anesthetized, the skin was prepped in sterile fashion. A tourniquet was then applied. Next the lateral aspect of hallux nail border was then sharply excised making sure to remove the entire offending nail border.  Next phenol was then applied under standard conditions to permanently destroy the matrix and copiously irrigated. Silvadene was applied. A dry sterile dressing was applied. After application of the dressing the tourniquet was removed and there is found to be an immediate capillary refill time to the digit. The patient  tolerated the procedure well without any complications. Post procedure instructions were discussed the patient for which he verbally understood. Follow-up in two weeks for nail check or sooner if any problems are to arise. Discussed signs/symptoms of infection and directed to call the office immediately should any occur or go directly to the emergency room. In the meantime, encouraged to call the office with any questions, concerns, changes symptoms.   Louann Sjogren, DPM

## 2023-09-22 NOTE — Patient Instructions (Signed)

## 2023-10-06 ENCOUNTER — Ambulatory Visit: Admitting: Podiatry

## 2023-10-06 ENCOUNTER — Encounter: Payer: Self-pay | Admitting: Medical-Surgical

## 2023-10-06 ENCOUNTER — Ambulatory Visit: Payer: Self-pay

## 2023-10-06 ENCOUNTER — Ambulatory Visit: Admitting: Medical-Surgical

## 2023-10-06 VITALS — BP 110/76 | HR 75 | Resp 20 | Ht 61.0 in | Wt 201.0 lb

## 2023-10-06 DIAGNOSIS — N898 Other specified noninflammatory disorders of vagina: Secondary | ICD-10-CM | POA: Diagnosis not present

## 2023-10-06 DIAGNOSIS — R102 Pelvic and perineal pain: Secondary | ICD-10-CM

## 2023-10-06 DIAGNOSIS — R300 Dysuria: Secondary | ICD-10-CM | POA: Diagnosis not present

## 2023-10-06 DIAGNOSIS — L600 Ingrowing nail: Secondary | ICD-10-CM

## 2023-10-06 LAB — POCT URINALYSIS DIP (CLINITEK)
Bilirubin, UA: NEGATIVE
Blood, UA: NEGATIVE
Glucose, UA: NEGATIVE mg/dL
Ketones, POC UA: NEGATIVE mg/dL
Leukocytes, UA: NEGATIVE
Nitrite, UA: NEGATIVE
POC PROTEIN,UA: NEGATIVE
Spec Grav, UA: 1.02 (ref 1.010–1.025)
Urobilinogen, UA: 0.2 U/dL
pH, UA: 5.5 (ref 5.0–8.0)

## 2023-10-06 NOTE — Telephone Encounter (Signed)
 Patient seen today by Cherre Cornish, NP

## 2023-10-06 NOTE — Telephone Encounter (Signed)
 Chief Complaint: lower abd pain Symptoms: lower abd pain, yellow/green discharge, dysuria, nausea, lower back and flank pain Frequency: discharge for 3 wks, dysuria and abd pain for 1 wk Pertinent Negatives: Patient denies fever, chills, hematuria, vomiting Disposition: [] ED /[] Urgent Care (no appt availability in office) / [x] Appointment(In office/virtual)/ []  [place] [facility]/ [] Home Care/ [] Refused Recommended Disposition /[] [place] [facility]/ []  Follow-up with PCP Additional Notes: Pt reports lower abd pain with lower back and flank pain, dysuria, and nausea for 1 wk w/ worsening. Pt also endorses yellow/green vaginal discharge for 3 wks that has become constant. Pt denies hematuria, fever, or chills. LMP 1st week of April. RN scheduled pt for 0950 today, pt agreeable to that plan. RN advised pt if she develops fever, weakness, vomiting, bloody urine she needs to go to the ED. Pt verbalized understanding.     Copied from CRM [id number]. Topic: Clinical - Red Word Triage >> Oct 06, 2023  8:31 AM Danelle Dunning F wrote: [facility] that prompted transfer to Nurse Triage:   Yellow/ green discharge from vaginal; lower abdominal; some burning sensation when she urinates but its not consistent Reason for Disposition  [1] MILD-MODERATE pain AND [2] constant AND [3] present > 2 hours  Answer Assessment - Initial Assessment Questions 1. LOCATION: "Where does it hurt?"      Lower abd pain 2. RADIATION: "Does the pain shoot anywhere else?" (e.g., chest, back)     Back ,flank  3. ONSET: "When did the pain begin?" (e.g., minutes, hours or days ago)      1 wk 4. SUDDEN: "Gradual or sudden onset?"     Gradual  5. PATTERN "Does the pain come and go, or is it constant?"    - If it comes and goes: "How long does it last?" "Do you have pain now?"     (Note: Comes and goes means the pain is intermittent. It goes away completely between bouts.)    - If constant: "Is it getting better, staying the same,  or getting worse?"      (Note: Constant means the pain never goes away completely; most serious pain is constant and gets worse.)      Constant  6. SEVERITY: "How bad is the pain?"  (e.g., Scale 1-10; mild, moderate, or severe)    - MILD (1-3): Doesn't interfere with normal activities, abdomen soft and not tender to touch.     - MODERATE (4-7): Interferes with normal activities or awakens from sleep, abdomen tender to touch.     - SEVERE (8-10): Excruciating pain, doubled over, unable to do any normal activities.       6/10  7. RECURRENT SYMPTOM: "Have you ever had this type of stomach pain before?" If Yes, ask: "When was the last time?" and "What happened that time?"      no 8. CAUSE: "What do you think is causing the stomach pain?"     Not sure  9. RELIEVING/AGGRAVATING FACTORS: "What makes it better or worse?" (e.g., antacids, bending or twisting motion, bowel movement)     N/a  10. OTHER SYMPTOMS: "Do you have any other symptoms?" (e.g., back pain, diarrhea, fever, urination pain, vomiting)       Back and flank pain, discharge, nausea, dysuria. LMP 1st wk of April  Answer Assessment - Initial Assessment Questions 1. SYMPTOM: "What's the main symptom you're concerned about?" (e.g., frequency, incontinence)     Lower abdominal pain, discharge, dysuria  2. ONSET: "When did the symptoms start?"  Yellow/green discharge since 3rd week of March, lower abd pain and dysuria for 1 wk 3. PAIN: "Is there any pain?" If Yes, ask: "How bad is it?" (Scale: 1-10; mild, moderate, severe)     Lower abd pain is constant 5-6/10 (difficulty sleeping last night), 4/10 dysuria  4. CAUSE: "What do you think is causing the symptoms?"     Not sure - hx of UTIs 5. OTHER SYMPTOMS: "Do you have any other symptoms?" (e.g., blood in urine, fever, flank pain, pain with urination)     Denies hematuria. Denies fever and chills. Endorses intermittent nausea. Endorses lower back pain "where my hips are"  Protocols  used: Urinary Symptoms-A-AH, Abdominal Pain - Female-A-AH

## 2023-10-06 NOTE — Progress Notes (Signed)
  Subjective:  Patient ID: Erica Boyer, female    DOB: 1986/01/25,   MRN: [id number]  No chief complaint on file.   38 y.o. female presents for follow-up of left great toenail partial avulsion. Relates doing well and has been soaking as instrcuted . Denies any other pedal complaints. Denies n/v/f/c.   Past Medical History:  Diagnosis Date   AMA (advanced maternal age) multigravida 35+ 12/13/2022   Anxiety with depression 02/02/2021   Asthma    Fetal arrhythmia affecting pregnancy, antepartum 03/24/2023   On Digoxin > has correct fetal arrhythmia for now  Twice weekly NST at [facility]  Plan is for delivery at [facility] with [facility] to continue there.      Fructose intolerance    Gastroesophageal reflux disease 11/13/2021   GERD (gastroesophageal reflux disease)    Gestational hypertension, third trimester 04/12/2023   History of gestational hypertension 11/30/2022   History of loop electrosurgical excision procedure (LEEP) of cervix affecting pregnancy in third trimester 04/12/2023   History of vitamin D  deficiency 11/13/2021   Lactose intolerance    Large for gestational age fetus affecting management of mother 02/25/2023   Migraines 03/29/2006   Polyhydramnios affecting pregnancy 02/25/2023   Supervision of normal pregnancy 09/28/2022              NURSING     PROVIDER      Office Location    [place]    Dating by    LMP c/w U/S at 11 wks      [facility] Model    Traditional    Anatomy U/S           Initiated care at     [employer]     English                     LAB RESULTS       Support Person    Doug    Genetics    NIPS: low risk        AFP:                            NT/IT (FT only)                     Vaginal Pap smear, abnormal     Objective:  Physical Exam: Vascular: DP/PT pulses 2/4 bilateral. CFT <3 seconds. Normal hair growth on digits. No edema.  Skin. No lacerations or abrasions bilateral feet. Left hallux nail healing well.  Musculoskeletal: MMT 5/5  bilateral lower extremities in DF, PF, Inversion and Eversion. Deceased ROM in DF of ankle joint.  Neurological: Sensation intact to light touch.   Assessment:   1. Ingrown left greater toenail      Plan:  Patient was evaluated and treated and all questions answered. Toe was evaluated and appears to be healing well.  May discontinue soaks and neosporin.  Patient to follow-up as needed.    Jennefer Moats, DPM

## 2023-10-06 NOTE — Progress Notes (Signed)
        Established patient visit  History, exam, impression, and plan:  1. Dysuria (Primary) 2. Vaginal discharge 3. Pelvic pain Pleasant 38 year old female presenting today with complaints of approximately 1 month of dysuria with burning, frequency, urgency, nocturia, and bladder spasms.  Also notes an increase in vaginal discharge that is described as constant, yellow most of the time, 1 episode of green.  Had nausea without vomiting last night that was helped by Zofran .  Her last episode of sexual activity was with her husband in early March.  She has not had any other partners.  Unsure if he had any other partners.  Following appropriate bladder hygiene.  Using the NuvaRing for menstrual management and birth control.  Having regular bowel movements.  POCT urinalysis perfectly normal.  Discussed possibilities.  Do not feel this is a urinary tract infection at this point given her clear urinalysis.  Getting wet prep for evaluation of BV and yeast and trichomonas.  Running urine for gonorrhea and chlamydia.  Offered pelvic ultrasound but she would like to wait for lab results to come back before proceeding to this.  Holding off on treatment until more information is available.  Patient agreeable to the plan. - POCT URINALYSIS DIP (CLINITEK) - WET PREP FOR TRICH, YEAST, CLUE - Chlamydia/Gonococcus/Trichomonas, NAA  Procedures performed this visit: None.  Return if symptoms worsen or fail to improve.  __________________________________ Maryl Snook, DNP, APRN, FNP-BC Primary Care and Sports Medicine [facility] [place]

## 2023-10-07 ENCOUNTER — Encounter: Payer: Self-pay | Admitting: Medical-Surgical

## 2023-10-07 LAB — WET PREP FOR TRICH, YEAST, CLUE
Clue Cell Exam: POSITIVE — AB
Trichomonas Exam: NEGATIVE
Yeast Exam: NEGATIVE

## 2023-10-07 MED ORDER — METRONIDAZOLE 500 MG PO TABS: 500.0000 mg | ORAL_TABLET | Freq: Two times a day (BID) | ORAL | 0 refills | Status: AC

## 2023-10-07 NOTE — Addendum Note (Signed)
 Addended byCherre Cornish on: 10/07/2023 05:15 PM   Modules accepted: Orders

## 2023-10-10 LAB — CHLAMYDIA/GONOCOCCUS/TRICHOMONAS, NAA
Chlamydia by NAA: NEGATIVE
Gonococcus by NAA: NEGATIVE
Trich vag by NAA: NEGATIVE

## 2023-10-10 NOTE — Telephone Encounter (Signed)
 Prior auth for: ZEP[employer]OUND  Determination: DENIED - this drug/product is not covered under the pharmacy benefit. Prior Authorization is not available. Auth #: [id number] Valid from: N/A

## 2023-10-12 NOTE — Telephone Encounter (Signed)
 This request has been handled. No further action is required. Please review other telephone encounters for additional information. Rx is not covered by the insurance.

## 2023-10-13 ENCOUNTER — Ambulatory Visit: Admitting: Radiology

## 2023-10-19 ENCOUNTER — Other Ambulatory Visit (HOSPITAL_BASED_OUTPATIENT_CLINIC_OR_DEPARTMENT_OTHER): Payer: Self-pay

## 2023-10-19 MED ORDER — ESCITALOPRAM OXALATE 10 MG PO TABS
10.0000 mg | ORAL_TABLET | Freq: Every day | ORAL | 3 refills | Status: DC
  Filled 2023-10-19: qty 90, 90d supply, fill #0
  Filled 2024-01-16: qty 90, 90d supply, fill #1

## 2023-10-19 MED ORDER — ETONOGESTREL-ETHINYL ESTRADIOL 0.12-0.015 MG/24HR VA RING
VAGINAL_RING | VAGINAL | 3 refills | Status: DC
  Filled 2023-10-19: qty 3, 84d supply, fill #0

## 2023-10-19 MED ORDER — PANTOPRAZOLE SODIUM 40 MG PO TBEC
40.0000 mg | DELAYED_RELEASE_TABLET | Freq: Every day | ORAL | 0 refills | Status: DC
  Filled 2024-01-16: qty 30, 30d supply, fill #0

## 2023-10-19 MED ORDER — DICYCLOMINE HCL 10 MG PO CAPS
10.0000 mg | ORAL_CAPSULE | Freq: Three times a day (TID) | ORAL | 0 refills | Status: DC
  Filled 2024-01-16: qty 90, 12d supply, fill #0

## 2023-10-19 MED ORDER — NORTRIPTYLINE HCL 25 MG PO CAPS: 25.0000 mg | ORAL_CAPSULE | Freq: Every day | ORAL | 0 refills | Status: DC

## 2023-10-20 ENCOUNTER — Ambulatory Visit: Admitting: Radiology

## 2023-10-20 ENCOUNTER — Other Ambulatory Visit (HOSPITAL_BASED_OUTPATIENT_CLINIC_OR_DEPARTMENT_OTHER): Payer: Self-pay

## 2023-10-21 ENCOUNTER — Ambulatory Visit: Admitting: Medical-Surgical

## 2023-11-04 ENCOUNTER — Other Ambulatory Visit (HOSPITAL_BASED_OUTPATIENT_CLINIC_OR_DEPARTMENT_OTHER): Payer: Self-pay

## 2023-11-04 MED ORDER — IBUPROFEN 800 MG PO TABS
800.0000 mg | ORAL_TABLET | Freq: Four times a day (QID) | ORAL | 0 refills | Status: DC
  Filled 2023-11-04: qty 20, 5d supply, fill #0

## 2023-11-04 MED ORDER — PENICILLIN V POTASSIUM 500 MG PO TABS
500.0000 mg | ORAL_TABLET | Freq: Four times a day (QID) | ORAL | 0 refills | Status: DC
Start: 2023-11-04 — End: 2023-12-24
  Filled 2023-11-04: qty 56, 14d supply, fill #0

## 2023-11-04 MED ORDER — CHLORHEXIDINE GLUCONATE 0.12 % MT SOLN
OROMUCOSAL | 2 refills | Status: AC
  Filled 2023-11-04: qty 473, 14d supply, fill #0
  Filled 2024-01-16: qty 473, 14d supply, fill #1

## 2023-11-10 HISTORY — PX: OTHER SURGICAL HISTORY: SHX169

## 2023-11-14 ENCOUNTER — Encounter: Payer: Self-pay | Admitting: Medical-Surgical

## 2023-11-14 ENCOUNTER — Other Ambulatory Visit (HOSPITAL_BASED_OUTPATIENT_CLINIC_OR_DEPARTMENT_OTHER): Payer: Self-pay

## 2023-11-14 ENCOUNTER — Ambulatory Visit

## 2023-11-14 ENCOUNTER — Ambulatory Visit: Admitting: Medical-Surgical

## 2023-11-14 VITALS — BP 125/78 | HR 72 | Resp 20 | Ht 61.0 in | Wt 200.0 lb

## 2023-11-14 DIAGNOSIS — M25561 Pain in right knee: Secondary | ICD-10-CM | POA: Diagnosis not present

## 2023-11-14 MED ORDER — PREDNISONE 50 MG PO TABS
50.0000 mg | ORAL_TABLET | Freq: Every day | ORAL | 0 refills | Status: DC
Start: 2023-11-14 — End: 2023-12-24
  Filled 2023-11-14: qty 5, 5d supply, fill #0

## 2023-11-14 MED ORDER — TRAMADOL HCL 50 MG PO TABS
50.0000 mg | ORAL_TABLET | Freq: Three times a day (TID) | ORAL | 0 refills | Status: AC | PRN
  Filled 2023-11-14: qty 15, 5d supply, fill #0

## 2023-11-14 NOTE — Progress Notes (Signed)
        Established patient visit  History, exam, impression, and plan:  1. Acute pain of right knee (Primary) Pleasant 38 year old female presenting today with reports of 3 to 4 weeks of acute pain in the right knee.  Notes that she had no known injury has had no bruising, erythema, or significant swelling.  The knee has a long history of popping with some locking up at times.  No worsening of those symptoms over the last 3 to 4 weeks.  She has diffuse knee pain along the lateral right knee from just above the patella extending down around the lateral knee.  Pain also noted to the lateral of the lower patella.  Pain with full flexion and full extension.  Also notes discomfort in the thigh and the knee with FABER and FADIR movements.  Has been on Advil  800 mg every 8 hours for dental surgery but this has done nothing for her knee pain.  Has not tried other measures.  On evaluation no joint effusion noted.  Areas of tenderness noted above.  Plan for right knee x-rays today.  Adding tramadol every 8 hours as needed for 5 days.  Prednisone  50 mg daily x 5 days.  After prednisone  is complete, okay to switch back to Advil  or ibuprofen  as desired.  Referring to physical therapy.  Discussed bracing only when on her feet for long periods of time. - DG Knee Complete 4 Views Right; Future - Ambulatory referral to Physical Therapy  Procedures performed this visit: None.  Return for right knee pain follow up if not better in 4-6 weeks.  __________________________________ Maryl Snook, DNP, APRN, FNP-BC Primary Care and Sports Medicine [facility]

## 2023-11-15 ENCOUNTER — Ambulatory Visit: Admitting: Medical-Surgical

## 2023-11-18 ENCOUNTER — Ambulatory Visit: Payer: Self-pay | Admitting: Medical-Surgical

## 2023-11-20 ENCOUNTER — Encounter: Payer: Self-pay | Admitting: Medical-Surgical

## 2023-11-21 MED ORDER — HYDROXYZINE HCL 25 MG PO TABS: 12.5000 mg | ORAL_TABLET | Freq: Three times a day (TID) | ORAL | 5 refills | Status: AC | PRN

## 2023-11-22 NOTE — Telephone Encounter (Signed)
 Last read by Londell River at 9:49PM on 11/21/2023.

## 2023-11-24 ENCOUNTER — Ambulatory Visit (INDEPENDENT_AMBULATORY_CARE_PROVIDER_SITE_OTHER): Payer: Self-pay | Admitting: Radiology

## 2023-11-24 ENCOUNTER — Other Ambulatory Visit (HOSPITAL_COMMUNITY)
Admission: RE | Admit: 2023-11-24 | Discharge: 2023-11-24 | Disposition: A | Discharge status: Home / Self Care | Source: Ambulatory Visit | Attending: Radiology | Admitting: Radiology

## 2023-11-24 ENCOUNTER — Ambulatory Visit

## 2023-11-24 ENCOUNTER — Encounter: Payer: Self-pay | Admitting: Radiology

## 2023-11-24 VITALS — BP 118/72 | HR 87 | Ht 61.5 in | Wt 201.2 lb

## 2023-11-24 DIAGNOSIS — Z01419 Encounter for gynecological examination (general) (routine) without abnormal findings: Secondary | ICD-10-CM

## 2023-11-24 DIAGNOSIS — Z3009 Encounter for other general counseling and advice on contraception: Secondary | ICD-10-CM

## 2023-11-24 DIAGNOSIS — Z1331 Encounter for screening for depression: Secondary | ICD-10-CM

## 2023-11-24 NOTE — Patient Instructions (Signed)

## 2023-11-24 NOTE — Progress Notes (Signed)
 Erica Boyer [birth year] [id number]   History:  38 y.o. G5P2 presents for annual exam. Interested in Mirena for [facility], currently using nuva ring, having BTB x 3 months. Going through a separation. Has a PCP, exercises recently. Treated for BV recently by PCP with flagyl .  Gynecologic History Patient's last menstrual period was 11/23/2023 (exact date). Period Cycle (Days):  (break through bleeding) Period Pattern: (!) Irregular Menstrual Flow: Heavy Menstrual Control: Maxi pad, Thin pad, Tampon Dysmenorrhea: (!) Moderate Dysmenorrhea Symptoms: Cramping Contraception/Family planning: abstinence and OCP (estrogen/progesterone ) Sexually active: no Last Pap: 2023. Results were: normal  Obstetric History OB History  Gravida Para Term Preterm AB Living  5 2 2  2 2   SAB IAB Ectopic Multiple Live Births  2    2    # Outcome Date GA Lbr Len/2nd Weight Sex Type Anes PTL Lv  5 Term 04/05/18 [redacted]w[redacted]d  7 lb 1.1 oz (3.205 kg) F Vag-Spont   LIV     Birth Comments: HTN  4 Term           3 Gravida           2 SAB           1 SAB                11/24/2023    7:56 AM 11/14/2023   11:54 AM 08/17/2023    2:40 PM  Depression screen PHQ 2/9  Decreased Interest 1 0   Down, Depressed, Hopeless 1 0 0  PHQ - 2 Score 2 0 0  Altered sleeping 2 2   Tired, decreased energy 3 1   Change in appetite 0 0   Feeling bad or failure about yourself  1 0   Trouble concentrating 1 0   Moving slowly or fidgety/restless 2 0   Suicidal thoughts 0 0   PHQ-9 Score 11 3   Difficult doing work/chores Somewhat difficult Somewhat difficult      The following portions of the patient's history were reviewed and updated as appropriate: allergies, current medications, past family history, past medical history, past social history, past surgical history, and problem list.  Review of Systems  All other systems reviewed and are negative.   Past medical history, past surgical history, family history and social history  were all reviewed and documented in the EPIC chart.  Exam:  Vitals:   11/24/23 0754  BP: 118/72  Pulse: 87  SpO2: 98%  Weight: 201 lb 3.2 oz (91.3 kg)  Height: 5' 1.5 (1.562 m)   Body mass index is 37.4 kg/m.  Physical Exam Vitals and nursing note reviewed. Exam conducted with a chaperone present.  Constitutional:      Appearance: Normal appearance. She is normal weight.  HENT:     Head: Normocephalic and atraumatic.  Neck:     Thyroid : No thyroid  mass, thyromegaly or thyroid  tenderness.   Cardiovascular:     Rate and Rhythm: Regular rhythm.     Heart sounds: Normal heart sounds.  Pulmonary:     Effort: Pulmonary effort is normal.     Breath sounds: Normal breath sounds.  Chest:  Breasts:    Breasts are symmetrical.     Right: Normal. No inverted nipple, mass, nipple discharge, skin change or tenderness.     Left: Normal. No inverted nipple, mass, nipple discharge, skin change or tenderness.  Abdominal:     General: Abdomen is flat. Bowel sounds are normal.     Palpations: Abdomen is  soft.  Genitourinary:    General: Normal vulva.     Vagina: Normal. No vaginal discharge, bleeding or lesions.     Cervix: Normal. No discharge or lesion.     Uterus: Normal. Not enlarged and not tender.      Adnexa: Right adnexa normal and left adnexa normal.       Right: No mass, tenderness or fullness.         Left: No mass, tenderness or fullness.    Lymphadenopathy:     Upper Body:     Right upper body: No axillary adenopathy.     Left upper body: No axillary adenopathy.   Skin:    General: Skin is warm and dry.   Neurological:     Mental Status: She is alert and oriented to person, place, and time.   Psychiatric:        Mood and Affect: Mood normal.        Thought Content: Thought content normal.        Judgment: Judgment normal.      Ellis Guys, CMA present for exam  Assessment/Plan:   1. Well woman exam with routine gynecological exam (Primary) - Cytology -  PAP( [place])  2. Counseling for birth control regarding intrauterine device (IUD) Desires Mirena insert, continue nuva ring until insertion Ibuprofen  800mg  2 hours before appt - IUD Insertion; Future  3. Positive depression screening Managed by PCP, on meds, has therapy referral   Return for Mirena insert.  Synetta Eves B WHNP-BC 8:22 AM 11/24/2023

## 2023-11-25 ENCOUNTER — Ambulatory Visit: Admitting: Radiology

## 2023-11-25 LAB — CYTOLOGY - PAP
Comment: NEGATIVE
Diagnosis: NEGATIVE
High risk HPV: NEGATIVE

## 2023-11-28 ENCOUNTER — Ambulatory Visit: Payer: Self-pay | Admitting: Radiology

## 2023-12-01 ENCOUNTER — Other Ambulatory Visit: Payer: Self-pay

## 2023-12-01 ENCOUNTER — Ambulatory Visit: Attending: Medical-Surgical

## 2023-12-01 DIAGNOSIS — M25561 Pain in right knee: Secondary | ICD-10-CM | POA: Diagnosis not present

## 2023-12-01 DIAGNOSIS — R262 Difficulty in walking, not elsewhere classified: Secondary | ICD-10-CM | POA: Diagnosis not present

## 2023-12-01 NOTE — Therapy (Signed)
 OUTPATIENT PHYSICAL THERAPY LOWER EXTREMITY EVALUATION   Patient Name: Erica Boyer MRN: [id number] DOB:03-Oct-1985, 38 y.o., female Today's Date: 12/01/2023  END OF SESSION:  PT End of Session - 12/01/23 1153     Visit Number 1    Number of Visits 8    Date for PT Re-Evaluation 01/26/24    Authorization Type [place] [employer] PPO    Progress Note Due on Visit 10    PT Start Time 0845    PT Stop Time 0930    PT Time Calculation (min) 45 min    Activity Tolerance Patient tolerated treatment well          Past Medical History:  Diagnosis Date   AMA (advanced maternal age) multigravida 35+ 12/13/2022   Anxiety with depression 02/02/2021   Asthma    Fetal arrhythmia affecting pregnancy, antepartum 03/24/2023   On Digoxin > has correct fetal arrhythmia for now  Twice weekly NST at [facility]  Plan is for delivery at [facility] with [facility] to continue there.      Fructose intolerance    Gastroesophageal reflux disease 11/13/2021   GERD (gastroesophageal reflux disease)    Gestational hypertension, third trimester 04/12/2023   History of gestational hypertension 11/30/2022   History of loop electrosurgical excision procedure (LEEP) of cervix affecting pregnancy in third trimester 04/12/2023   History of vitamin D  deficiency 11/13/2021   Lactose intolerance    Large for gestational age fetus affecting management of mother 02/25/2023   Migraines 03/29/2006   Polyhydramnios affecting pregnancy 02/25/2023   Supervision of normal pregnancy 09/28/2022              NURSING     PROVIDER      Office Location    [place]    Dating by    LMP c/w U/S at 11 wks      [facility] Model    Traditional    Anatomy U/S           Initiated care at     [employer]     English                     LAB RESULTS       Support Person    Doug    Genetics    NIPS: low risk        AFP:                            NT/IT (FT only)                     Vaginal Pap smear, abnormal    Past Surgical  History:  Procedure Laterality Date   jaw procedure  11/10/2023   growth removed from jaw   NO PAST SURGERIES     Patient Active Problem List   Diagnosis Date Noted   Paronychia of great toe of left foot 08/19/2023   Intractable migraine without aura and without status migrainosus 07/09/2023   BMI 40.0-44.9, adult (HCC) 11/30/2022   Obesity 10/05/2022   Pre-diabetes 10/01/2022   Asthma 11/27/2003    PCP: Cherre Cornish, NP  REFERRING PROVIDER: Cherre Cornish, NP  REFERRING DIAG: M25.561 (ICD-10-CM) - Acute pain of right knee  THERAPY DIAG:  Difficulty in walking, not  elsewhere classified  Acute pain of right knee  Rationale for Evaluation and Treatment: Rehabilitation  ONSET DATE: referral date 11/14/2023  SUBJECTIVE:   SUBJECTIVE STATEMENT: The patient stated the R knee bothers most when sleeping at night, she has a hard time getting comfortable, and when she is on her feet for long periods. She cannot avoid being on her feet as she works 12-hour shifts, and cares for two children at home. She feels the pain the most at the top of the R knee cap, and then it wraps around to the medial and lateral side. There is some stiffness getting out of bed in the morning and the R knee does not seem to loosen up until she is walking into work in the morning. When going downstairs in the parking garage in the morning, the R knee hurts. She takes the elevator up in the evenings, but does not that when she goes upstairs when in the hospital during the day, the R knee hurts. Sitting cross-legged will also increase the R knee pain. The patient stated her activity is mostly related to pain, she has not noticed any reduction in strength or endurance of the muscles of the R leg.  The steroid medication that was prescribed did seem to help while taking it, but things went back to baseline once the steroid medication was completed. She has been alternating ibuprofen  and Tylenol  alternating, which takes the  edge off, but does not help significantly. When the R knee pain is intolerable at night, the patient will take the tramadol  that was prescribed. The crunching/popping in the R knee is not new, it has been going on for a long time - the pain is new and has been present for about 103-month. She does not know because if this is because she has been more active with work, more active caring for the children, or if she flared things up during a recent move.  PERTINENT HISTORY: The patient has intermittent R sciatica pain, starting when she had her daughter 5 years ago and worsened recently with birth of her son in November. The low back has had pain for a number of years, and the patient has similar crunching sensations in her low back for a long time.  PAIN:  Are you having pain? Yes: NPRS scale: At rest/average: 4/10; 8/10 at worst, 4/10 is best  Pain location: R knee pain Pain description: Achy Aggravating factors: See above Relieving factors: See above  PRECAUTIONS: None  RED FLAGS: None   WEIGHT BEARING RESTRICTIONS: No  FALLS:  Has patient fallen in last 6 months? No  PATIENT GOALS: Increase activity tolerance related to R knee pain, improve R knee pain  OBJECTIVE:  Note: Objective measures were completed at Evaluation unless otherwise noted.  DIAGNOSTIC FINDINGS:  CLINICAL DATA:  Lateral right knee pain for 3 weeks. No known injury.   EXAM: RIGHT KNEE - COMPLETE 4+ VIEW   COMPARISON:  PA and AP views of the right knee 03/09/2021   FINDINGS: Normal bone mineralization. Joint spaces are preserved. No joint effusion. No acute fracture or dislocation. Large body habitus.   IMPRESSION: Normal right knee radiographs.  PATIENT SURVEYS:   Patient-specific activity scoring scheme (Point to one number):  0 represents "unable to perform." 10 represents "able to perform at prior level.  Activity Eval     Walking long distances or long periods 4     Bending and staying in  that position  2    Exercising  or hiking 3         Total: 9/30   Total score = sum of the activity scores/number of activities Minimum detectable change (90%CI) for average score = 2 points Minimum detectable change (90%CI) for single activity score = 3 points PSFS developed by: [place] Spitz., & Binkley, J. (1995). Assessing disability and change on individual patients: a report of a patient specific measure. Physiotherapy [place], 47, [phone]. Reproduced with the permission of the authors  LOWER EXTREMITY ROM:  Passive ROM Right eval Left eval  Hip flexion WNL WNL  Hip extension    Hip abduction 40 deg WNL  Hip adduction    Hip internal rotation WNL WNL  Hip external rotation WNL WNL  Knee flexion WNL* WNL  Knee extension WNL WNL  Ankle dorsiflexion 5 deg 10 deg  Ankle plantarflexion    Ankle inversion    Ankle eversion     (Blank rows = not tested)  LOWER EXTREMITY MMT:  MMT Right eval Left eval  Hip flexion 4* 4  Hip extension 4 4  Hip abduction 3+ 4  Hip adduction 3+ 4  Hip internal rotation 5 5  Hip external rotation 3+* 3+  Knee flexion 4+** 4+  Knee extension 5* 5  Ankle dorsiflexion    Ankle plantarflexion    Ankle inversion    Ankle eversion     (Blank rows = not tested)  *= mild pain  **= moderate pain  FUNCTIONAL TESTS:  8 step ups/downs: -Front step up: 2/10 pain, post-treatment 0/10 -Back step down: 3/10 pain, post-treatment 0/10 -Side step up/down: no pain -Front step down: 5/10 pain, post-treatment 3/10  [facility] Adult PT Treatment:                                                DATE: 12/01/2023 Therapeutic Exercise: Instructed home program (see below)  PATIENT EDUCATION:  PATIENT EDUCATION:  Education details: on current presentation, on HEP, on clinical outcomes score and POC Person educated: Patient Education method: Explanation, Demonstration, and Handouts Education comprehension: verbalized understanding   HOME  EXERCISE PROGRAM: Access Code: [email or web address] URL: [email or web address] Date: 12/01/2023 Prepared by: Jeannette Mills  Exercises - Hip Flexor Mobilization with Foam Roll  - 1 x daily - 7 x weekly - 3 sets - 10 reps - Adductor Mobilization with Foam Roll  - 1 x daily - 7 x weekly - 3 sets - 10 reps  ASSESSMENT:  CLINICAL IMPRESSION: Patient is a 38 y.o.  who was seen today for physical therapy evaluation and treatment for acute R knee pain. Pain was worst with a forward step down compared to a backward step down and a forward step up. No pain was reported with a lateral step up. R knee pain was present with R knee flexion overpressures, but ROM was equal to the L. R ankle dorsiflexion was more limited than the L, which may be a consideration given the increased pain with the forward step down. Muscle testing revealed the most R knee pain with testing of the R hamstrings, but pain was also more mildly noted with R hip flexion and R hip external rotation. Weakness was noted with all muscles of the hip and knee with the exception of hip internal rotation and knee extension, with hip abduction and adduction being weaker on the R. Treatment today  to the R adductors improved pain with hamstring testing, and treatment to the R hip flexors decreased pain with hip flexion and external rotation. These treatments also resolved pain with the forward step up and the backward step down, while also reducing the pain with the forward step down. Will continue physical therapy to continue addressing impairments and building and effective home exercise program.  OBJECTIVE IMPAIRMENTS: decreased activity tolerance, difficulty walking, decreased strength, and pain.   REHAB POTENTIAL: Good  CLINICAL DECISION MAKING: Stable/uncomplicated  EVALUATION COMPLEXITY: Low   GOALS: Goals reviewed with patient? yes  SHORT TERM GOALS: Target date: 12/29/2023   Patient will be independent in self management  strategies to improve quality of life and functional outcomes. Baseline: New Program Goal status: INITIAL  2.  Patient will report at least 50% improvement in overall symptoms and/or function to demonstrate improved functional mobility Baseline: 0% better Goal status: INITIAL  3.  Patient will improve item bending and staying in that position on the Patient Specific Functional Scale by at least 2 points. Baseline: 2 Goal status: INITIAL  4.   Patient will improve item walking long distances/periods on the Patient Specific Functional Scale by at least 2 points. Baseline: 4 Goal status: INITIAL  5.  Patient will improve item exercising or hiking on the Patient Specific Functional Scale by at least 2 points. Baseline: 3 Goal status: INITIAL    LONG TERM GOALS: 01/26/2024  Patient will report at least 75% improvement in overall symptoms and/or function to demonstrate improved functional mobility Baseline: 0% better Goal status: INITIAL  2.  Patient will be able to perform a forward 8 step down without pain. Baseline: 5/10 pain Goal status: INITIAL  3.  Patient will improve item bending and staying in that position on the Patient Specific Functional Scale by at least 4 points. Baseline: 2 Goal status: INITIAL  4.   Patient will improve item walking long distances/periods on the Patient Specific Functional Scale by at least 4 points. Baseline: 4 Goal status: INITIAL  5.  Patient will improve item exercising or hiking on the Patient Specific Functional Scale by at least 4 points. Baseline: 3 Goal status: INITIAL  PLAN:  PT FREQUENCY: 1x/week  PT DURATION: 8 weeks  PLANNED INTERVENTIONS: 97110-Therapeutic exercises, 97530- Therapeutic activity, [id number]- Neuromuscular re-education, [phone]- Self Care, 60454- Manual therapy, [phone]- Gait training, [phone]- Orthotic Fit/training, [phone]- Canalith repositioning, [phone]- Aquatic Therapy, 97014- Electrical stimulation  (unattended), [phone]- Ionotophoresis 4mg /ml Dexamethasone , Patient/Family education, Balance training, Stair training, Taping, Dry Needling, Joint mobilization, Joint manipulation, Spinal manipulation, Spinal mobilization, Cryotherapy, and Moist heat   PLAN FOR NEXT SESSION: Lower extremity strengthening for the hip, knee, and ankle as indicated; functional activities; improve R ankle dorsiflexion; soft tissue mobilization or other manual therapies to improve muscle function; balance/proprioception as indicated   Zoe Hinds, PT 12/01/2023, 11:56 AM

## 2023-12-15 ENCOUNTER — Ambulatory Visit: Attending: Medical-Surgical

## 2023-12-15 DIAGNOSIS — R262 Difficulty in walking, not elsewhere classified: Secondary | ICD-10-CM | POA: Insufficient documentation

## 2023-12-15 DIAGNOSIS — M25561 Pain in right knee: Secondary | ICD-10-CM | POA: Diagnosis not present

## 2023-12-15 NOTE — Therapy (Addendum)
 OUTPATIENT PHYSICAL THERAPY LOWER EXTREMITY TREATMENT PHYSICAL THERAPY DISCHARGE SUMMARY  Visits from Start of Care: 2  Current functional level related to goals / functional outcomes: No formal re-assessment of goals.    Remaining deficits: Status unknown   Education / Equipment: N/A   Patient agrees to discharge. Patient goals were not met. Patient is being discharged due to not returning since the last visit.   Patient Name: Erica Boyer MRN: [id number] DOB:1986/04/24, 38 y.o., female Today's Date: 12/15/2023  END OF SESSION:  PT End of Session - 12/15/23 1450     Visit Number 2    Number of Visits 8    Date for PT Re-Evaluation 01/26/24    Authorization Type [place] [employer] PPO    PT Start Time 1449    PT Stop Time 1529    PT Time Calculation (min) 40 min    Activity Tolerance Patient tolerated treatment well           Past Medical History:  Diagnosis Date   AMA (advanced maternal age) multigravida 35+ 12/13/2022   Anxiety with depression 02/02/2021   Asthma    Fetal arrhythmia affecting pregnancy, antepartum 03/24/2023   On Digoxin > has correct fetal arrhythmia for now  Twice weekly NST at [facility]  Plan is for delivery at [facility] with [facility] to continue there.      Fructose intolerance    Gastroesophageal reflux disease 11/13/2021   GERD (gastroesophageal reflux disease)    Gestational hypertension, third trimester 04/12/2023   History of gestational hypertension 11/30/2022   History of loop electrosurgical excision procedure (LEEP) of cervix affecting pregnancy in third trimester 04/12/2023   History of vitamin D  deficiency 11/13/2021   Lactose intolerance    Large for gestational age fetus affecting management of mother 02/25/2023   Migraines 03/29/2006   Polyhydramnios affecting pregnancy 02/25/2023   Supervision of normal pregnancy 09/28/2022              NURSING     PROVIDER      Office Location    [place]    Dating by    LMP c/w U/S at 11 wks       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   NO PAST SURGERIES     Patient Active Problem List   Diagnosis Date Noted   Paronychia of great toe of left foot 08/19/2023   Intractable migraine without aura and without  status migrainosus 07/09/2023   BMI 40.0-44.9, adult (HCC) 11/30/2022   Obesity 10/05/2022   Pre-diabetes 10/01/2022   Asthma 11/27/2003    PCP: Willo Mini, NP  REFERRING PROVIDER: Willo Mini, NP  REFERRING DIAG: [phone] (ICD-10-CM) - Acute pain of right knee  THERAPY DIAG:  Difficulty in walking, not elsewhere classified  Acute pain of right knee  Rationale for Evaluation and Treatment: Rehabilitation  ONSET DATE: referral date 11/14/2023  SUBJECTIVE:   SUBJECTIVE STATEMENT: Patient reports the knee has been feeling a little better.   PERTINENT HISTORY: The patient has intermittent R sciatica pain, starting when she had her daughter 5 years ago and worsened recently with birth of her son in November. The low back has had pain for a number of years, and the patient has similar crunching sensations in her low back for a long time.  PAIN:  Are you having pain? Yes: NPRS scale: 2 Pain location: Rt lateral knee Pain description: dull, ache  Aggravating factors: See above Relieving factors: See above  PRECAUTIONS: None  RED FLAGS: None   WEIGHT BEARING RESTRICTIONS: No  FALLS:  Has patient fallen in last 6 months? No  PATIENT GOALS: Increase activity tolerance related to R knee pain, improve R knee pain  OBJECTIVE:  Note: Objective measures were completed at Evaluation  unless otherwise noted.  DIAGNOSTIC FINDINGS:  CLINICAL DATA:  Lateral right knee pain for 3 weeks. No known injury.   EXAM: RIGHT KNEE - COMPLETE 4+ VIEW   COMPARISON:  PA and AP views of the right knee 03/09/2021   FINDINGS: Normal bone mineralization. Joint spaces are preserved. No joint effusion. No acute fracture or dislocation. Large body habitus.   IMPRESSION: Normal right knee radiographs.  PATIENT SURVEYS:   Patient-specific activity scoring scheme (Point to one number):  0 represents "unable to perform." 10 represents "able to perform at prior level.  Activity Eval     Walking long distances or long periods 4     Bending and staying in that position  2    Exercising or hiking 3         Total: 9/30   Total score = sum of the activity scores/number of activities Minimum detectable change (90%CI) for average score = 2 points Minimum detectable change (90%CI) for single activity score = 3 points PSFS developed by: Rosalee MYRTIS Marvis KYM Charlet CHRISTELLA., & Binkley, J. (1995). Assessing disability and change on individual patients: a report of a patient specific measure. [employer] [place], 47, 741-736. Reproduced with the permission of the authors  LOWER EXTREMITY ROM:  Passive ROM Right eval Left eval  Hip flexion WNL WNL  Hip extension    Hip abduction 40 deg WNL  Hip adduction    Hip internal rotation WNL WNL  Hip external rotation WNL WNL  Knee flexion WNL* WNL  Knee extension WNL WNL  Ankle dorsiflexion 5 deg 10 deg  Ankle plantarflexion    Ankle inversion    Ankle eversion     (Blank rows = not tested)  LOWER EXTREMITY MMT:  MMT Right eval Left eval  Hip flexion 4* 4  Hip extension 4 4  Hip abduction 3+ 4  Hip adduction 3+ 4  Hip internal rotation 5 5  Hip external rotation 3+* 3+  Knee flexion 4+** 4+  Knee extension 5* 5  Ankle dorsiflexion    Ankle plantarflexion    Ankle inversion    Ankle eversion     (Blank rows = not  tested)  *=  mild pain  **= moderate pain  FUNCTIONAL TESTS:  8 step ups/downs: -Front step up: 2/10 pain, post-treatment 0/10 -Back step down: 3/10 pain, post-treatment 0/10 -Side step up/down: no pain -Front step down: 5/10 pain, post-treatment 3/10 [facility] Adult PT Treatment:                                                DATE: 12/15/23 Therapeutic Exercise: Hooklying hip abduction blue band 2 x 10  Hooklying resisted march blue band 2 x 10  Hip bridge 2  x10  Supine hip flexor stretch RLE x 1 minute Clamshells 2 x 10  Calf stretch at wall gastroc/soleus x 30 sec each  Reviewed and updated HEP   [facility] Adult PT Treatment:                                                DATE: 12/01/2023 Therapeutic Exercise: Instructed home program (see below)  PATIENT EDUCATION:  PATIENT EDUCATION:  Education details: HEP update Person educated: Patient Education method: Explanation, Demonstration, and Handouts Education comprehension: verbalized understanding, returned demo, cues   HOME EXERCISE PROGRAM: Access Code: [email or web address] URL: [email or web address] Date: 12/15/2023 Prepared by: Lucie Meeter  Exercises - Hip Flexor Mobilization with Foam Roll  - 1 x daily - 7 x weekly - 3 sets - 10 reps - Adductor Mobilization with Foam Roll  - 1 x daily - 7 x weekly - 3 sets - 10 reps - Hooklying Clamshell with Resistance  - 1 x daily - 7 x weekly - 2 sets - 10 reps - Supine March with Resistance Band  - 1 x daily - 7 x weekly - 2 sets - 10 reps - Modified Thomas Stretch  - 1 x daily - 7 x weekly - 3 sets - 30 sec  hold - Supine Bridge  - 1 x daily - 7 x weekly - 2 sets - 10 reps - Clamshell  - 1 x daily - 7 x weekly - 2 sets - 10 reps - Gastroc Stretch on Wall  - 1 x daily - 7 x weekly - 3 sets - 30 sec  hold - Soleus Stretch on Wall  - 1 x daily - 7 x weekly - 3 sets - 30 sec  hold  ASSESSMENT:  CLINICAL IMPRESSION: Patient reports overall improvement in her Rt knee pain since  initial evaluation. Today's session focused on introducing hip and knee strengthening with good tolerance. Initially reported stretch in Rt hip flexor with bridge as opposed to glute activation. Following hip flexor stretching she reported improved activation of hip extensors with bridge. HEP was updated to include further LE strengthening and stretching.   EVAL: Patient is a 38 y.o.  who was seen today for physical therapy evaluation and treatment for acute R knee pain. Pain was worst with a forward step down compared to a backward step down and a forward step up. No pain was reported with a lateral step up. R knee pain was present with R knee flexion overpressures, but ROM was equal to the L. R ankle dorsiflexion was more limited than the L, which may be a consideration given the increased pain with the forward step down. Muscle testing revealed the most  R knee pain with testing of the R hamstrings, but pain was also more mildly noted with R hip flexion and R hip external rotation. Weakness was noted with all muscles of the hip and knee with the exception of hip internal rotation and knee extension, with hip abduction and adduction being weaker on the R. Treatment today to the R adductors improved pain with hamstring testing, and treatment to the R hip flexors decreased pain with hip flexion and external rotation. These treatments also resolved pain with the forward step up and the backward step down, while also reducing the pain with the forward step down. Will continue physical therapy to continue addressing impairments and building and effective home exercise program.  OBJECTIVE IMPAIRMENTS: decreased activity tolerance, difficulty walking, decreased strength, and pain.   REHAB POTENTIAL: Good  CLINICAL DECISION MAKING: Stable/uncomplicated  EVALUATION COMPLEXITY: Low   GOALS: Goals reviewed with patient? yes  SHORT TERM GOALS: Target date: 12/29/2023   Patient will be independent in self  management strategies to improve quality of life and functional outcomes. Baseline: New Program Goal status: INITIAL  2.  Patient will report at least 50% improvement in overall symptoms and/or function to demonstrate improved functional mobility Baseline: 0% better Goal status: INITIAL  3.  Patient will improve item bending and staying in that position on the Patient Specific Functional Scale by at least 2 points. Baseline: 2 Goal status: INITIAL  4.   Patient will improve item walking long distances/periods on the Patient Specific Functional Scale by at least 2 points. Baseline: 4 Goal status: INITIAL  5.  Patient will improve item exercising or hiking on the Patient Specific Functional Scale by at least 2 points. Baseline: 3 Goal status: INITIAL    LONG TERM GOALS: 01/26/2024  Patient will report at least 75% improvement in overall symptoms and/or function to demonstrate improved functional mobility Baseline: 0% better Goal status: INITIAL  2.  Patient will be able to perform a forward 8 step down without pain. Baseline: 5/10 pain Goal status: INITIAL  3.  Patient will improve item bending and staying in that position on the Patient Specific Functional Scale by at least 4 points. Baseline: 2 Goal status: INITIAL  4.   Patient will improve item walking long distances/periods on the Patient Specific Functional Scale by at least 4 points. Baseline: 4 Goal status: INITIAL  5.  Patient will improve item exercising or hiking on the Patient Specific Functional Scale by at least 4 points. Baseline: 3 Goal status: INITIAL  PLAN:  PT FREQUENCY: 1x/week  PT DURATION: 8 weeks  PLANNED INTERVENTIONS: 97110-Therapeutic exercises, 97530- Therapeutic activity, 97112- Neuromuscular re-education, [phone]- Self Care, 02859- Manual therapy, [phone]- Gait training, [phone]- Orthotic Fit/training, [phone]- Canalith repositioning, [id number]- Aquatic Therapy, [id number]- Electrical  stimulation (unattended), [phone]- Ionotophoresis 4mg /ml Dexamethasone , Patient/Family education, Balance training, Stair training, Taping, Dry Needling, Joint mobilization, Joint manipulation, Spinal manipulation, Spinal mobilization, Cryotherapy, and Moist heat     Lucie Meeter, PT, DPT, ATC 12/15/23 3:29 PM  Lucie Meeter, PT, DPT, ATC 05/15/24 12:24 PM

## 2023-12-22 ENCOUNTER — Ambulatory Visit

## 2023-12-22 NOTE — Therapy (Incomplete)
 OUTPATIENT PHYSICAL THERAPY LOWER EXTREMITY TREATMENT   Patient Name: Erica Boyer MRN: [id number] DOB:11-30-1985, 38 y.o., female Today's Date: 12/22/2023  END OF SESSION:     Past Medical History:  Diagnosis Date   AMA (advanced maternal age) multigravida 35+ 12/13/2022   Anxiety with depression 02/02/2021   Asthma    Fetal arrhythmia affecting pregnancy, antepartum 03/24/2023   On Digoxin > has correct fetal arrhythmia for now  Twice weekly NST at [facility]  Plan is for delivery at [facility] to continue there.      Fructose intolerance    Gastroesophageal reflux disease 11/13/2021   GERD (gastroesophageal reflux disease)    Gestational hypertension, third trimester 04/12/2023   History of gestational hypertension 11/30/2022   History of loop electrosurgical excision procedure (LEEP) of cervix affecting pregnancy in third trimester 04/12/2023   History of vitamin D  deficiency 11/13/2021   Lactose intolerance    Large for gestational age fetus affecting management of mother 02/25/2023   Migraines 03/29/2006   Polyhydramnios affecting pregnancy 02/25/2023   Supervision of normal pregnancy 09/28/2022              NURSING     PROVIDER      Office Location    [place]    Dating by    LMP c/w U/S at 11 wks      [facility] Model    Traditional    Anatomy U/S           Initiated care at     [employer]     English                     LAB RESULTS       Support Person    Doug    Genetics    NIPS: low risk        AFP:                            NT/IT (FT only)                     Vaginal Pap smear, abnormal    Past Surgical History:  Procedure Laterality Date   jaw procedure  11/10/2023   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  Rationale for Evaluation and Treatment: Rehabilitation  ONSET DATE: referral date 11/14/2023  SUBJECTIVE:   SUBJECTIVE STATEMENT: Patient reports the knee has been feeling a little better.   PERTINENT HISTORY: The patient has intermittent R sciatica pain, starting when she had her daughter 5 years ago and worsened recently with birth of her son in November. The low back has had pain for a number of years, and the patient has similar crunching sensations in her low back for a long time.  PAIN:  Are you having pain? Yes: NPRS scale: 2 Pain location: Rt lateral knee Pain description: dull, ache  Aggravating factors: See above Relieving factors: See above  PRECAUTIONS: None  RED FLAGS: None   WEIGHT BEARING RESTRICTIONS: No  FALLS:  Has patient fallen in last 6 months? No  PATIENT GOALS: Increase activity tolerance related to R knee pain, improve R knee pain  OBJECTIVE:  Note: Objective measures were completed at Evaluation unless otherwise noted.  DIAGNOSTIC FINDINGS:  CLINICAL DATA:  Lateral right knee pain for 3 weeks. No known injury.   EXAM: RIGHT KNEE - COMPLETE 4+ VIEW   COMPARISON:  PA and AP views of the right knee 03/09/2021   FINDINGS: Normal bone mineralization. Joint spaces are preserved. No joint effusion. No acute fracture or dislocation. Large body habitus.   IMPRESSION: Normal right knee radiographs.  PATIENT SURVEYS:   Patient-specific activity scoring scheme (Point to one number):  0 represents "unable to perform." 10 represents "able to perform at prior level.  Activity Eval     Walking long distances or long periods 4     Bending and staying in that position  2    Exercising or hiking 3          Total: 9/30   Total score = sum of the activity scores/number of activities Minimum detectable change (90%CI) for average score = 2 points Minimum detectable change (90%CI) for single activity score = 3 points PSFS developed by: Rosalee MYRTIS Marvis KYM Charlet CHRISTELLA., & Binkley, J. (1995). Assessing disability and change on individual patients: a report of a patient specific measure. Physiotherapy [place], 47, 741-736. Reproduced with the permission of the authors  LOWER EXTREMITY ROM:  Passive ROM Right eval Left eval  Hip flexion WNL WNL  Hip extension    Hip abduction 40 deg WNL  Hip adduction    Hip internal rotation WNL WNL  Hip external rotation WNL WNL  Knee flexion WNL* WNL  Knee extension WNL WNL  Ankle dorsiflexion 5 deg 10 deg  Ankle plantarflexion    Ankle inversion    Ankle eversion     (Blank rows = not tested)  LOWER EXTREMITY MMT:  MMT Right eval Left eval  Hip flexion 4* 4  Hip extension 4 4  Hip abduction 3+ 4  Hip adduction 3+ 4  Hip internal rotation 5 5  Hip external rotation 3+* 3+  Knee flexion 4+** 4+  Knee extension 5* 5  Ankle dorsiflexion    Ankle plantarflexion    Ankle inversion    Ankle eversion     (Blank rows = not tested)  *= mild pain  **= moderate pain  FUNCTIONAL TESTS:  8 step ups/downs: -Front step up: 2/10 pain, post-treatment 0/10 -Back step down: 3/10 pain, post-treatment 0/10 -Side step up/down: no pain -Front step down: 5/10 pain, post-treatment 3/10 [facility] Adult PT Treatment:                                                DATE: 12/15/23 Therapeutic Exercise: Hooklying hip abduction blue band 2 x 10  Hooklying resisted march blue band 2 x 10  Hip bridge 2  x10  Supine hip flexor stretch RLE x 1 minute Clamshells 2 x 10  Calf stretch at wall gastroc/soleus x 30 sec each  Reviewed and updated HEP  [facility] Adult PT Treatment:                                                DATE: 12/01/2023 Therapeutic  Exercise: Instructed home program (see below)  PATIENT EDUCATION:  PATIENT EDUCATION:  Education details: HEP update Person educated: Patient Education method: Explanation, Demonstration, and Handouts Education comprehension: verbalized understanding, returned demo, cues   HOME EXERCISE PROGRAM: Access Code: [email or web address] URL: [email or web address] Date: 12/15/2023 Prepared by: Lucie Meeter  Exercises - Hip Flexor Mobilization with Foam Roll  - 1 x daily - 7 x weekly - 3 sets - 10 reps - Adductor Mobilization with Foam Roll  - 1 x daily - 7 x weekly - 3 sets - 10 reps - Hooklying Clamshell with Resistance  - 1 x daily - 7 x weekly - 2 sets - 10 reps - Supine March with Resistance Band  - 1 x daily - 7 x weekly - 2 sets - 10 reps - Modified Thomas Stretch  - 1 x daily - 7 x weekly - 3 sets - 30 sec  hold - Supine Bridge  - 1 x daily - 7 x weekly - 2 sets - 10 reps - Clamshell  - 1 x daily - 7 x weekly - 2 sets - 10 reps - Gastroc Stretch on Wall  - 1 x daily - 7 x weekly - 3 sets - 30 sec  hold - Soleus Stretch on Wall  - 1 x daily - 7 x weekly - 3 sets - 30 sec  hold  ASSESSMENT:  CLINICAL IMPRESSION: Patient reports overall improvement in her Rt knee pain since initial evaluation. Today's session focused on introducing hip and knee strengthening with good tolerance. Initially reported stretch in Rt hip flexor with bridge as opposed to glute activation. Following hip flexor stretching she reported improved activation of hip extensors with bridge. HEP was updated to include further LE strengthening and stretching.   EVAL: Patient is a 38 y.o.  who was seen today for physical therapy evaluation and treatment for acute R knee pain. Pain was worst with a forward step down compared to a backward step down and a forward step up. No pain was reported with a lateral step up. R knee pain was present with R knee flexion overpressures, but ROM was equal to the L. R ankle dorsiflexion  was more limited than the L, which may be a consideration given the increased pain with the forward step down. Muscle testing revealed the most R knee pain with testing of the R hamstrings, but pain was also more mildly noted with R hip flexion and R hip external rotation. Weakness was noted with all muscles of the hip and knee with the exception of hip internal rotation and knee extension, with hip abduction and adduction being weaker on the R. Treatment today to the R adductors improved pain with hamstring testing, and treatment to the R hip flexors decreased pain with hip flexion and external rotation. These treatments also resolved pain with the forward step up and the backward step down, while also reducing the pain with the forward step down. Will continue physical therapy to continue addressing impairments and building and effective home exercise program.  OBJECTIVE IMPAIRMENTS: decreased activity tolerance, difficulty walking, decreased strength, and pain.   REHAB POTENTIAL: Good  CLINICAL DECISION MAKING: Stable/uncomplicated  EVALUATION  COMPLEXITY: Low   GOALS: Goals reviewed with patient? yes  SHORT TERM GOALS: Target date: 12/29/2023   Patient will be independent in self management strategies to improve quality of life and functional outcomes. Baseline: New Program Goal status: INITIAL  2.  Patient will report at least 50% improvement in overall symptoms and/or function to demonstrate improved functional mobility Baseline: 0% better Goal status: INITIAL  3.  Patient will improve item bending and staying in that position on the Patient Specific Functional Scale by at least 2 points. Baseline: 2 Goal status: INITIAL  4.   Patient will improve item walking long distances/periods on the Patient Specific Functional Scale by at least 2 points. Baseline: 4 Goal status: INITIAL  5.  Patient will improve item exercising or hiking on the Patient Specific Functional Scale by at  least 2 points. Baseline: 3 Goal status: INITIAL    LONG TERM GOALS: 01/26/2024  Patient will report at least 75% improvement in overall symptoms and/or function to demonstrate improved functional mobility Baseline: 0% better Goal status: INITIAL  2.  Patient will be able to perform a forward 8 step down without pain. Baseline: 5/10 pain Goal status: INITIAL  3.  Patient will improve item bending and staying in that position on the Patient Specific Functional Scale by at least 4 points. Baseline: 2 Goal status: INITIAL  4.   Patient will improve item walking long distances/periods on the Patient Specific Functional Scale by at least 4 points. Baseline: 4 Goal status: INITIAL  5.  Patient will improve item exercising or hiking on the Patient Specific Functional Scale by at least 4 points. Baseline: 3 Goal status: INITIAL  PLAN:  PT FREQUENCY: 1x/week  PT DURATION: 8 weeks  PLANNED INTERVENTIONS: 97110-Therapeutic exercises, 97530- Therapeutic activity, [id number]- Neuromuscular re-education, 97535- Self Care, 02859- Manual therapy, [phone]- Gait training, [phone]- Orthotic Fit/training, [phone]- Canalith repositioning, [phone]- Aquatic Therapy, [id number]- Electrical stimulation (unattended), [phone]- Ionotophoresis 4mg /ml Dexamethasone , Patient/Family education, Balance training, Stair training, Taping, Dry Needling, Joint mobilization, Joint manipulation, Spinal manipulation, Spinal mobilization, Cryotherapy, and Moist heat   PLAN FOR NEXT SESSION: Lower extremity strengthening for the hip, knee, and ankle as indicated; functional activities; improve R ankle dorsiflexion; soft tissue mobilization or other manual therapies to improve muscle function; balance/proprioception as indicated  Alvena Kiernan, PT, DPT, ATC 12/22/23 7:56 AM

## 2023-12-24 ENCOUNTER — Other Ambulatory Visit: Payer: Self-pay

## 2023-12-24 ENCOUNTER — Ambulatory Visit
Admission: EM | Admit: 2023-12-24 | Discharge: 2023-12-24 | Disposition: A | Discharge status: Home / Self Care | Attending: Family Medicine | Admitting: Family Medicine

## 2023-12-24 DIAGNOSIS — R491 Aphonia: Secondary | ICD-10-CM | POA: Diagnosis not present

## 2023-12-24 DIAGNOSIS — J039 Acute tonsillitis, unspecified: Secondary | ICD-10-CM

## 2023-12-24 MED ORDER — AMOXICILLIN-POT CLAVULANATE 875-125 MG PO TABS: 1.0000 | ORAL_TABLET | Freq: Two times a day (BID) | ORAL | 0 refills | Status: DC

## 2023-12-24 MED ORDER — PREDNISONE 20 MG PO TABS: ORAL_TABLET | ORAL | 0 refills | Status: DC

## 2023-12-24 NOTE — Discharge Instructions (Addendum)
 Advised patient take medication as directed with food to completion.  Advised to take prednisone  with first dose of Augmentin  for the next 5 of 7 days.  Encouraged to increase daily water intake to 64 ounces per day while taking these medications.  Advised if symptoms worsen and/or unresolved please follow-up with your PCP or here for further evaluation.

## 2023-12-24 NOTE — ED Triage Notes (Signed)
 Pt c/o sore throat and hoarse voice x 3 days. Denies fever. Taking tylenol  prn.

## 2023-12-24 NOTE — ED Provider Notes (Signed)
 Erica Boyer CARE    CSN: [id number] Arrival date & time: 12/24/23  1149      History   Chief Complaint Chief Complaint  Patient presents with   Sore Throat   Hoarse    HPI Erica Boyer is a 38 y.o. female.   HPI Pleasant 38 year old female presents with sore throat and loss of voice for 3 days.  Past Medical History:  Diagnosis Date   AMA (advanced maternal age) multigravida 35+ 12/13/2022   Anxiety with depression 02/02/2021   Asthma    Fetal arrhythmia affecting pregnancy, antepartum 03/24/2023   On Digoxin > has correct fetal arrhythmia for now  Twice weekly NST at [facility]  Plan is for delivery at [facility] with [facility] to continue there.      Fructose intolerance    Gastroesophageal reflux disease 11/13/2021   GERD (gastroesophageal reflux disease)    Gestational hypertension, third trimester 04/12/2023   History of gestational hypertension 11/30/2022   History of loop electrosurgical excision procedure (LEEP) of cervix affecting pregnancy in third trimester 04/12/2023   History of vitamin D  deficiency 11/13/2021   Lactose intolerance    Large for gestational age fetus affecting management of mother 02/25/2023   Migraines 03/29/2006   Polyhydramnios affecting pregnancy 02/25/2023   Supervision of normal pregnancy 09/28/2022              NURSING     PROVIDER      Office Location    [place]    Dating by    LMP c/w U/S at 11 wks      [facility] Model    Traditional    Anatomy U/S           Initiated care at     [employer]     English                     LAB RESULTS       Support Person    Doug    Genetics    NIPS: low risk        AFP:                            NT/IT (FT only)                     Vaginal Pap smear, abnormal     Patient Active Problem List   Diagnosis Date Noted   Paronychia of great toe of left foot 08/19/2023   Intractable migraine without aura and without status migrainosus 07/09/2023   BMI 40.0-44.9, adult (HCC)  11/30/2022   Obesity 10/05/2022   Pre-diabetes 10/01/2022   Asthma 11/27/2003    Past Surgical History:  Procedure Laterality Date   jaw procedure  11/10/2023   growth removed from jaw   NO PAST SURGERIES      OB History     Gravida  5   Para  2   Term  2   Preterm      AB  2   Living  2      SAB  2   IAB      Ectopic      Multiple      Live Births  2  Home Medications    Prior to Admission medications   Medication Sig Start Date End Date Taking? Authorizing Provider  amoxicillin -clavulanate (AUGMENTIN ) 875-125 MG tablet Take 1 tablet by mouth every 12 (twelve) hours. 12/24/23  Yes Teddy Sharper, FNP  predniSONE  (DELTASONE ) 20 MG tablet Take 3 tabs PO daily x 5 days. 12/24/23  Yes Teddy Sharper, FNP  albuterol  (VENTOLIN  HFA) 108 (90 Base) MCG/ACT inhaler Inhale 2 puffs into the lungs every 4 (four) hours as needed for wheezing or shortness of breath. 08/06/22   Christopher Savannah, PA-C  baclofen  (LIORESAL ) 10 MG tablet Take 1 tablet (10 mg total) by mouth 3 (three) times daily as needed for muscle spasms. 07/08/23   Willo Mini, NP  chlorhexidine  (PERIDEX ) 0.12 % solution Rinse mouth with 15 mL (1 capful) for 30 seconds in the morning and evening after toothbrushing. Spit after rinsing, do not swallow. 11/04/23     dicyclomine  (BENTYL ) 10 MG capsule Take 1-2 capsules (10-20 mg total) by mouth 4 (four) times daily -  before meals and at bedtime. 09/09/23   Willo Mini, NP  dicyclomine  (BENTYL ) 10 MG capsule Take 1-2 capsules (10-20 mg total) by mouth 4 (four) times daily -  before meals and at bedtime. 09/09/23     escitalopram  (LEXAPRO ) 10 MG tablet Take 1 tablet (10 mg total) by mouth daily. 09/09/23   Willo Mini, NP  escitalopram  (LEXAPRO ) 10 MG tablet Take 1 tablet (10 mg total) by mouth daily. 09/09/23     famotidine (PEPCID) 20 MG tablet Take 20 mg by mouth 2 (two) times daily.    [provider]  fluticasone  (FLONASE ) 50 MCG/ACT nasal spray Place  2 sprays into both nostrils daily. 10/22/22   Willo Mini, NP  hydrOXYzine  (ATARAX ) 25 MG tablet Take 0.5-2 tablets (12.5-50 mg total) by mouth 3 (three) times daily as needed. 11/21/23   Willo Mini, NP  ibuprofen  (IBU) 800 MG tablet Take 1 tablet (800 mg total) by mouth every 6-8 hours as needed. 11/04/23     nortriptyline  (PAMELOR ) 25 MG capsule Take 1 capsule (25 mg total) by mouth at bedtime. 07/08/23   Willo Mini, NP  nortriptyline  (PAMELOR ) 25 MG capsule Take 1 capsule (25 mg total) by mouth at bedtime. 07/08/23     ondansetron  (ZOFRAN -ODT) 4 MG disintegrating tablet Take 1 tablet (4 mg total) by mouth every 8 (eight) hours as needed for nausea or vomiting. 08/05/23   Willo Mini, NP  pantoprazole  (PROTONIX ) 40 MG tablet Take 1 tablet (40 mg total) by mouth daily. 08/05/23   Willo Mini, NP  pantoprazole  (PROTONIX ) 40 MG tablet Take 1 tablet (40 mg total) by mouth daily. 08/05/23   Willo Mini, NP  Prenatal Vit-Fe Fumarate-FA (PRENATAL VITAMIN PO) Take by mouth. Patient not taking: Reported on 11/24/2023    [provider]  rizatriptan  (MAXALT -MLT) 10 MG disintegrating tablet Take 1 tablet (10 mg total) by mouth as needed for migraine. May repeat in 2 hours if needed 06/14/23   Willo Mini, NP  tranexamic acid  (LYSTEDA ) 650 MG TABS tablet Take 2 tablets (1,300 mg total) by mouth 3 (three) times daily. Patient not taking: Reported on 11/24/2023 07/22/23   Willo Mini, NP    Family History Family History  Problem Relation Age of Onset   Hypertension Mother    Sleep apnea Mother    Hyperlipidemia Father    Heart failure Father    Esophageal cancer Father    Throat cancer Father    Diabetes Maternal Grandmother  Heart failure Paternal Grandfather     Social History Social History   Tobacco Use   Smoking status: Never    Passive exposure: Current   Smokeless tobacco: Never  Vaping Use   Vaping status: Never Used  Substance Use Topics   Alcohol use: Yes    Comment: rare   Drug  use: Never     Allergies   Cephalexin, Mometasone, Moxifloxacin, Sumatriptan, Mometasone furoate, Naproxen sodium, and Sumatriptan-naproxen sodium   Review of Systems Review of Systems   Physical Exam Triage Vital Signs ED Triage Vitals [12/24/23 1242]  Encounter Vitals Group     BP (!) 130/92     Girls Systolic BP Percentile      Girls Diastolic BP Percentile      Boys Systolic BP Percentile      Boys Diastolic BP Percentile      Pulse Rate 88     Resp (!) 1     Temp 98.4 F (36.9 C)     Temp Source Oral     SpO2 97 %     Weight      Height      Head Circumference      Peak Flow      Pain Score 5     Pain Loc      Pain Education      Exclude from Growth Chart    No data found.  Updated Vital Signs BP (!) 130/92 (BP Location: Right Arm)   Pulse 88   Temp 98.4 F (36.9 C) (Oral)   Resp 17   Wt 195 lb (88.5 kg)   LMP 11/23/2023 (Exact Date)   SpO2 97%   BMI 36.25 kg/m    Physical Exam Vitals and nursing note reviewed.  Constitutional:      General: She is not in acute distress.    Appearance: Normal appearance. She is well-developed. She is obese. She is not ill-appearing.  HENT:     Head: Normocephalic and atraumatic.     Right Ear: Tympanic membrane, ear canal and external ear normal.     Left Ear: Tympanic membrane, ear canal and external ear normal.     Mouth/Throat:     Mouth: Mucous membranes are moist.     Pharynx: Oropharynx is clear. Uvula midline. Posterior oropharyngeal erythema present.     Tonsils: 3+ on the right. 3+ on the left.  Eyes:     Extraocular Movements: Extraocular movements intact.     Conjunctiva/sclera: Conjunctivae normal.     Pupils: Pupils are equal, round, and reactive to light.  Cardiovascular:     Rate and Rhythm: Normal rate and regular rhythm.     Heart sounds: Normal heart sounds. No murmur heard. Pulmonary:     Effort: Pulmonary effort is normal.     Breath sounds: Normal breath sounds. No wheezing, rhonchi or  rales.  Musculoskeletal:        General: Normal range of motion.     Cervical back: Normal range of motion and neck supple.  Skin:    General: Skin is warm and dry.  Neurological:     General: No focal deficit present.     Mental Status: She is alert and oriented to person, place, and time.  Psychiatric:        Mood and Affect: Mood normal.        Behavior: Behavior normal.      UC Treatments / Results  Labs (all labs ordered are listed, but  only abnormal results are displayed) Labs Reviewed - No data to display  EKG   Radiology No results found.  Procedures Procedures (including critical care time)  Medications Ordered in UC Medications - No data to display  Initial Impression / Assessment and Plan / UC Course  I have reviewed the triage vital signs and the nursing notes.  Pertinent labs & imaging results that were available during my care of the patient were reviewed by me and considered in my medical decision making (see chart for details).     MDM: 1.  Tonsillitis-Rx'd Augmentin  875/125 mg tablet: Take 1 tablet twice daily x 7 days patient reports taking this medication numerous times without adverse reaction; 2.  Loss of voice-Rx prednisone  20 mg tablet: Take 3 tablets p.o. daily x 5 days patient reports taking this medication numerous times without adverse reactions. Advised patient take medication as directed with food to completion.  Advised to take prednisone  with first dose of Augmentin  for the next 5 of 7 days.  Encouraged to increase daily water intake to 64 ounces per day while taking these medications.  Advised if symptoms worsen and/or unresolved please follow-up with your PCP or here for further evaluation.  Patient discharged home, hemodynamically stable. Final Clinical Impressions(s) / UC Diagnoses   Final diagnoses:  Tonsillitis  Loss of voice     Discharge Instructions      Advised patient take medication as directed with food to completion.   Advised to take prednisone  with first dose of Augmentin  for the next 5 of 7 days.  Encouraged to increase daily water intake to 64 ounces per day while taking these medications.  Advised if symptoms worsen and/or unresolved please follow-up with your PCP or here for further evaluation.     ED Prescriptions     Medication Sig Dispense Auth. Provider   amoxicillin -clavulanate (AUGMENTIN ) 875-125 MG tablet Take 1 tablet by mouth every 12 (twelve) hours. 14 tablet Erica Neddo, FNP   predniSONE  (DELTASONE ) 20 MG tablet Take 3 tabs PO daily x 5 days. 15 tablet Erica Alvis, FNP      PDMP not reviewed this encounter.   Teddy Sharper, FNP 12/24/23 1311

## 2023-12-29 ENCOUNTER — Ambulatory Visit

## 2023-12-29 ENCOUNTER — Encounter: Payer: Self-pay | Admitting: Radiology

## 2023-12-29 ENCOUNTER — Ambulatory Visit: Admitting: Radiology

## 2023-12-29 VITALS — BP 118/72 | HR 96 | Wt 200.8 lb

## 2023-12-29 DIAGNOSIS — Z30430 Encounter for insertion of intrauterine contraceptive device: Secondary | ICD-10-CM | POA: Diagnosis not present

## 2023-12-29 DIAGNOSIS — Z3009 Encounter for other general counseling and advice on contraception: Secondary | ICD-10-CM

## 2023-12-29 DIAGNOSIS — Z01812 Encounter for preprocedural laboratory examination: Secondary | ICD-10-CM

## 2023-12-29 LAB — PREGNANCY, URINE: Preg Test, Ur: NEGATIVE

## 2023-12-29 MED ORDER — LEVONORGESTREL 20 MCG/DAY IU IUD
1.0000 | INTRAUTERINE_SYSTEM | Freq: Once | INTRAUTERINE | Status: AC
  Administered 2023-12-29: 1 via INTRAUTERINE

## 2023-12-29 NOTE — Progress Notes (Signed)
   Erica Boyer 11/18/1985 [id number]   History:  38 y.o. G5P2 presents for insertion of Mirena  IUD.  Pt has been counseled about risks and benefits as well as complications.    Patient's last menstrual period was 12/21/2023 (approximate). GC/CT/Trich testing: obtained   Past medical history, past surgical history, family history and social history were all reviewed and documented in the EPIC chart.  ROS:  A ROS was performed and pertinent positives and negatives are included.  Time out performed and informed consent received.  Exam: Vitals:   12/29/23 1207  BP: 118/72  Pulse: 96  SpO2: 98%  Weight: 200 lb 12.8 oz (91.1 kg)   Body mass index is 37.33 kg/m.  Pelvic exam: Vulva:  normal female genitalia Vagina:  normal vagina, no discharge, exudate, lesion, or erythema Cervix:  Non-tender, Negative CMT, no lesions or redness. Uterus:  normal shape, position and consistency    Procedure:  Speculum inserted.   Cervix visualized and cleansed with Betadine x 3.  Tenaculum placed on anterior cervix. Then uterus sounded to 8.5 cm. IUD inserted easily. Strings trimmed to 3 cm.  Minimal bleeding noted.  Pt tolerated the procedure well.  Chaperone present: Erica Boyer, CMA   Assessment/Plan: 1. Encounter for IUD insertion (Primary) - levonorgestrel  (MIRENA ) 20 MCG/DAY IUD 1 each  2. Encounter for preprocedural laboratory examination - Pregnancy, urine; negative - SURESWAB CT/NG/T. vaginalis      Return for recheck 4-6 weeks Pt aware to call for any concerns Pt aware removal due no later than 12/29/2031, IUD card given to pt.   Erica Boyer B WHNP-BC, 12:23 PM 12/29/2023

## 2023-12-30 LAB — SURESWAB CT/NG/T. VAGINALIS
C. trachomatis RNA, TMA: NOT DETECTED
N. gonorrhoeae RNA, TMA: NOT DETECTED
Trichomonas vaginalis RNA: NOT DETECTED

## 2024-01-02 ENCOUNTER — Ambulatory Visit: Payer: Self-pay | Admitting: Radiology

## 2024-01-05 ENCOUNTER — Encounter

## 2024-01-16 ENCOUNTER — Other Ambulatory Visit (HOSPITAL_BASED_OUTPATIENT_CLINIC_OR_DEPARTMENT_OTHER): Payer: Self-pay

## 2024-02-01 ENCOUNTER — Ambulatory Visit: Payer: Self-pay

## 2024-02-01 NOTE — Telephone Encounter (Signed)
 FYI Only or Action Required?: FYI only for provider.  Patient was last seen in primary care on 11/14/2023 by Erica Mini, NP.  Called Nurse Triage reporting No chief complaint on file..  Symptoms began several weeks ago.  Interventions attempted: OTC medications: Tylenol , Motrin .  Symptoms are: gradually worsening.  Triage Disposition: See PCP When Office is Open (Within 3 Days)  Patient/caregiver understands and will follow disposition?: Yes Copied from CRM #[id number]. Topic: Clinical - Red Word Triage >> Feb 01, 2024 10:50 AM Graeme ORN wrote: Red Word that prompted transfer to Nurse Triage:  Pain back week right side mid back - tried heating pad tylenol  motrin  Reason for Disposition  [1] MODERATE back pain (e.g., interferes with normal activities) AND [2] present > 3 days  Answer Assessment - Initial Assessment Questions 1. ONSET: When did the pain begin? (e.g., minutes, hours, days)     Two weeks  2. LOCATION: Where does it hurt? (upper, mid or lower back)     Lower Back  3. SEVERITY: How bad is the pain?  (e.g., Scale 1-10; mild, moderate, or severe)     Moderate, 5  4. PATTERN: Is the pain constant? (e.g., yes, no; constant, intermittent)      Constant  5. RADIATION: Does the pain shoot into your legs or somewhere else?     Denies  6. CAUSE:  What do you think is causing the back pain?      Unsure  7. BACK OVERUSE:  Any recent lifting of heavy objects, strenuous work or exercise?     Bedside Nurse, pushing, pulling  8. MEDICINES: What have you taken so far for the pain? (e.g., nothing, acetaminophen , NSAIDS)     Tylenol , Heating  9. NEUROLOGIC SYMPTOMS: Do you have any weakness, numbness, or problems with bowel/bladder control?     Denies  10. OTHER SYMPTOMS: Do you have any other symptoms? (e.g., fever, abdomen pain, burning with urination, blood in urine)       Denies   11. PREGNANCY: Is there any chance you are pregnant? When was your  last menstrual period?       No and IUD  Protocols used: Back Pain-A-AH

## 2024-02-01 NOTE — Telephone Encounter (Signed)
 Appt scheduled for 02/03/24

## 2024-02-02 NOTE — Progress Notes (Unsigned)
        Established patient visit   History of Present Illness   Discussed the use of AI scribe software for clinical note transcription with the patient, who gave verbal consent to proceed.  History of Present Illness            Physical Exam   Physical Exam  Assessment & Plan   Assessment and Plan               Follow up   No follow-ups on file.  __________________________________ Zada FREDRIK Palin, DNP, APRN, FNP-BC Primary Care and Sports Medicine [facility] [place]

## 2024-02-03 ENCOUNTER — Ambulatory Visit: Admitting: Medical-Surgical

## 2024-02-03 VITALS — BP 119/80 | HR 80 | Resp 20 | Ht 61.5 in | Wt 210.0 lb

## 2024-02-03 DIAGNOSIS — M545 Low back pain, unspecified: Secondary | ICD-10-CM | POA: Diagnosis not present

## 2024-02-03 DIAGNOSIS — R109 Unspecified abdominal pain: Secondary | ICD-10-CM | POA: Diagnosis not present

## 2024-02-03 LAB — POCT URINALYSIS DIP (CLINITEK)
Bilirubin, UA: NEGATIVE
Blood, UA: NEGATIVE
Glucose, UA: NEGATIVE mg/dL
Ketones, POC UA: NEGATIVE mg/dL
Leukocytes, UA: NEGATIVE
Nitrite, UA: NEGATIVE
POC PROTEIN,UA: NEGATIVE
Spec Grav, UA: 1.02 (ref 1.010–1.025)
Urobilinogen, UA: 0.2 U/dL
pH, UA: 7 (ref 5.0–8.0)

## 2024-02-03 MED ORDER — PREDNISONE 50 MG PO TABS: 50.0000 mg | ORAL_TABLET | Freq: Every day | ORAL | 0 refills | Status: DC

## 2024-02-03 MED ORDER — CYCLOBENZAPRINE HCL 10 MG PO TABS
5.0000 mg | ORAL_TABLET | Freq: Three times a day (TID) | ORAL | 0 refills | Status: DC | PRN
Start: 2024-02-03 — End: 2024-05-04

## 2024-02-09 ENCOUNTER — Ambulatory Visit: Admitting: Radiology

## 2024-02-10 ENCOUNTER — Ambulatory Visit: Admitting: Radiology

## 2024-02-10 ENCOUNTER — Encounter: Payer: Self-pay | Admitting: Radiology

## 2024-02-10 VITALS — BP 120/76 | Wt 210.0 lb

## 2024-02-10 DIAGNOSIS — Z30431 Encounter for routine checking of intrauterine contraceptive device: Secondary | ICD-10-CM

## 2024-02-10 NOTE — Progress Notes (Signed)
     History:  38 y.o. [id number] here today for today for IUD string check; Mirena  IUD was placed  12/29/23. No complaints about the IUD, no concerning side effects.  The following portions of the patient's history were reviewed and updated as appropriate: allergies, current medications, past family history, past medical history, past social history, past surgical history and problem list.  Review of Systems:  Pertinent items are noted in HPI.   Objective:  Physical Exam Blood pressure 120/76, weight 210 lb (95.3 kg), last menstrual period 01/23/2024. Gen: NAD Abd: Soft, nontender and nondistended Pelvic: Normal appearing external genitalia; normal appearing vaginal mucosa and cervix.  IUD strings visualized, about 2 cm in length outside cervix.   Darice Hoit, CMA present for exam  Assessment & Plan:  Normal IUD check. Patient may keep IUD in place for up to 8 years. May remover sooner  if she desires pregnancy, or has side effects within that time.   Machelle Raybon, WHNP

## 2024-02-14 ENCOUNTER — Encounter: Payer: Self-pay | Admitting: Sports Medicine

## 2024-02-23 ENCOUNTER — Encounter: Payer: Self-pay | Admitting: Medical-Surgical

## 2024-02-23 ENCOUNTER — Ambulatory Visit: Admitting: Medical-Surgical

## 2024-02-23 VITALS — BP 112/81 | HR 94 | Temp 99.3°F | Resp 20 | Ht 61.5 in | Wt 209.0 lb

## 2024-02-23 DIAGNOSIS — J019 Acute sinusitis, unspecified: Secondary | ICD-10-CM

## 2024-02-23 DIAGNOSIS — B9689 Other specified bacterial agents as the cause of diseases classified elsewhere: Secondary | ICD-10-CM | POA: Insufficient documentation

## 2024-02-23 LAB — POCT RAPID STREP A (OFFICE): Rapid Strep A Screen: NEGATIVE

## 2024-02-23 MED ORDER — HYDROCODONE BIT-HOMATROP MBR 5-1.5 MG/5ML PO SOLN: 5.0000 mL | Freq: Three times a day (TID) | ORAL | 0 refills | Status: DC | PRN

## 2024-02-23 MED ORDER — AMOXICILLIN-POT CLAVULANATE 875-125 MG PO TABS: 1.0000 | ORAL_TABLET | Freq: Two times a day (BID) | ORAL | 0 refills | Status: DC

## 2024-02-23 NOTE — Progress Notes (Signed)
        Established patient visit  History of Present Illness   Discussed the use of AI scribe software for clinical note transcription with the patient, who gave verbal consent to proceed.  History of Present Illness   Erica Boyer is a 38 year old female who presents with nasal itching and respiratory symptoms.  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  Constitutional:      General: She is not in acute distress.    Appearance: Normal appearance.  HENT:     Head: Normocephalic and atraumatic.     Right Ear: Tympanic membrane and ear canal normal. No middle ear effusion.     Left Ear: Tympanic membrane and ear canal normal.  No middle ear effusion.     Nose: Congestion present.     Mouth/Throat:     Mouth: Mucous membranes are moist.     Pharynx: Posterior oropharyngeal erythema present. No uvula swelling.     Tonsils: Tonsillar exudate present. 2+ on the right. 2+ on the left.  Eyes:     Conjunctiva/sclera: Conjunctivae normal.     Pupils: Pupils are equal, round, and reactive to light.  Cardiovascular:     Rate and Rhythm: Normal rate and regular rhythm.     Pulses: Normal pulses.     Heart sounds: Normal heart sounds. No murmur heard.    No friction rub. No gallop.  Pulmonary:     Effort: Pulmonary effort is normal.  No respiratory distress.     Breath sounds: Normal breath sounds. No wheezing.  Lymphadenopathy:     Cervical: Cervical adenopathy present.  Skin:    General: Skin is warm and dry.  Neurological:     Mental Status: She is alert and oriented to person, place, and time.  Psychiatric:        Mood and Affect: Mood normal.        Behavior: Behavior normal.        Thought Content: Thought content normal.        Judgment: Judgment normal.    Assessment & Plan   Assessment and Plan    Acute bacterial sinusitis Acute bacterial sinusitis. Differential diagnosis considered. Recent antibiotic use noted. Lungs clear, indicating post-nasal drip contributing to cough. - Negative for strep. - Start Augmentin  BID x 7 days. - Prescribe cough syrup for nocturnal cough. - Advise continuation of Tylenol  for fever and body aches. - Recommend probiotic/prebiotic given recent antibiotic treatment.     Follow up   Return if symptoms worsen or fail to improve. __________________________________ Zada FREDRIK Palin, DNP, APRN, FNP-BC Primary Care and Sports Medicine [facility] [place]

## 2024-03-13 DIAGNOSIS — F419 Anxiety disorder, unspecified: Secondary | ICD-10-CM | POA: Diagnosis not present

## 2024-03-17 ENCOUNTER — Other Ambulatory Visit (HOSPITAL_BASED_OUTPATIENT_CLINIC_OR_DEPARTMENT_OTHER): Payer: Self-pay

## 2024-03-17 DIAGNOSIS — F419 Anxiety disorder, unspecified: Secondary | ICD-10-CM | POA: Diagnosis not present

## 2024-03-17 MED ORDER — ESCITALOPRAM OXALATE 10 MG PO TABS
15.0000 mg | ORAL_TABLET | Freq: Every day | ORAL | 0 refills | Status: DC
  Filled 2024-03-17: qty 45, 30d supply, fill #0

## 2024-03-19 ENCOUNTER — Other Ambulatory Visit (HOSPITAL_BASED_OUTPATIENT_CLINIC_OR_DEPARTMENT_OTHER): Payer: Self-pay

## 2024-03-21 DIAGNOSIS — F419 Anxiety disorder, unspecified: Secondary | ICD-10-CM | POA: Diagnosis not present

## 2024-03-23 ENCOUNTER — Other Ambulatory Visit (HOSPITAL_BASED_OUTPATIENT_CLINIC_OR_DEPARTMENT_OTHER): Payer: Self-pay

## 2024-03-23 ENCOUNTER — Ambulatory Visit

## 2024-03-23 ENCOUNTER — Encounter: Payer: Self-pay | Admitting: Medical-Surgical

## 2024-03-23 ENCOUNTER — Ambulatory Visit: Admitting: Medical-Surgical

## 2024-03-23 VITALS — BP 113/78 | HR 88 | Temp 98.7°F | Resp 20 | Ht 61.5 in | Wt 212.1 lb

## 2024-03-23 DIAGNOSIS — J358 Other chronic diseases of tonsils and adenoids: Secondary | ICD-10-CM

## 2024-03-23 DIAGNOSIS — M546 Pain in thoracic spine: Secondary | ICD-10-CM

## 2024-03-23 DIAGNOSIS — M545 Low back pain, unspecified: Secondary | ICD-10-CM

## 2024-03-23 DIAGNOSIS — J351 Hypertrophy of tonsils: Secondary | ICD-10-CM

## 2024-03-23 MED ORDER — GABAPENTIN 300 MG PO CAPS
300.0000 mg | ORAL_CAPSULE | Freq: Three times a day (TID) | ORAL | 3 refills | Status: AC | PRN
  Filled 2024-03-23: qty 90, 30d supply, fill #0
  Filled 2024-05-01 – 2024-05-20 (×2): qty 90, 30d supply, fill #1

## 2024-03-23 MED ORDER — MELOXICAM 15 MG PO TABS
15.0000 mg | ORAL_TABLET | Freq: Every day | ORAL | 0 refills | Status: DC
  Filled 2024-03-23: qty 30, 30d supply, fill #0

## 2024-03-23 MED ORDER — PREDNISONE 50 MG PO TABS
50.0000 mg | ORAL_TABLET | Freq: Every day | ORAL | 0 refills | Status: DC
  Filled 2024-03-23: qty 5, 5d supply, fill #0

## 2024-03-23 NOTE — Progress Notes (Signed)
 Established patient visit   History of Present Illness   Discussed the use of AI scribe software for clinical note transcription with the patient, who gave verbal consent to proceed.  History of Present Illness   Erica Boyer is a 38 year old female who presents with worsening back pain and sore throat.  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 - Motrin  and Tylenol  have not provided relief - Flexeril  at night provides inconsistent relief - Certain chairs at work, especially older ones, worsen pain - No numbness, weakness, tingling in legs, or loss of bowel or bladder control - Audible 'crunching' sounds in the spine - Engages in modified wall Pilates exercises  Pharyngitis and tonsillar symptoms - Sore throat since Wednesday - Red and swollen tonsils with multiple tonsil stones - Ear pain, nasal congestion, and sneezing - Cold beverages provide some relief - Frequent sore throats and tonsil issues, typically four times per year, with increased frequency since July - Increased number of tonsil stones, described as 'little gashes' in the tonsils - Children recently ill, youngest child still symptomatic      Physical Exam   Physical Exam Vitals reviewed.  Constitutional:      General: She is not in acute distress.    Appearance: Normal appearance.  HENT:     Head: Normocephalic and atraumatic.     Mouth/Throat:     Mouth: Mucous membranes are moist.     Pharynx: Posterior oropharyngeal erythema (mild) present.     Tonsils: Tonsillar exudate present. No tonsillar abscesses. 3+ on the right. 3+ on the left.  Cardiovascular:     Rate and Rhythm: Normal rate and regular rhythm.     Pulses: Normal pulses.     Heart sounds:  Normal heart sounds. No murmur heard.    No friction rub. No gallop.  Pulmonary:     Effort: Pulmonary effort is normal. No respiratory distress.     Breath sounds: Normal breath sounds. No wheezing.  Musculoskeletal:     Thoracic back: Tenderness present. Decreased range of motion.     Lumbar back: Tenderness present. Decreased range of motion.  Skin:    General: Skin is warm and dry.  Neurological:     Mental Status: She is alert and oriented to person, place, and time.  Psychiatric:        Mood and Affect: Mood normal.        Behavior: Behavior normal.        Thought Content: Thought content normal.        Judgment: Judgment normal.    Assessment & Plan   Problem List Items Addressed This Visit   None Visit Diagnoses       Acute right-sided low back pain without sciatica    -  Primary   Relevant Medications   predniSONE  (DELTASONE ) 50 MG tablet   meloxicam  (MOBIC ) 15 MG tablet   gabapentin  (NEURONTIN ) 300 MG capsule   Other Relevant Orders   DG Lumbar Spine Complete   Ambulatory referral to Physical Therapy     Tonsil stone       Relevant Orders   Ambulatory referral to ENT     Enlarged tonsils       Relevant  Orders   Ambulatory referral to ENT     Acute bilateral thoracic back pain       Relevant Medications   predniSONE  (DELTASONE ) 50 MG tablet   meloxicam  (MOBIC ) 15 MG tablet   Other Relevant Orders   DG Thoracic Spine W/Swimmers      Assessment and Plan    Low/mid back pain with radiation Chronic low back pain radiating to legs, worsens with movement, no red flag symptoms, affects daily activities. - Order thoracic and lumbar spine x-rays. - Start prednisone  50 mg daily x 5 days. - Start meloxicam  15 mg daily post-steroid burst. - Refer to physical therapy. - Prescribe gabapentin  300mg  TID prn. - Continue Flexeril  for nighttime pain.  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 - Refer to ENT for evaluation and possible tonsillectomy.     Follow up   Return in about 6 weeks (around 05/04/2024) for back pain follow up. __________________________________ Zada FREDRIK Palin, DNP, APRN, FNP-BC Primary Care and Sports Medicine [facility] [place]

## 2024-03-28 ENCOUNTER — Ambulatory Visit: Payer: Self-pay | Admitting: Medical-Surgical

## 2024-03-29 ENCOUNTER — Encounter (INDEPENDENT_AMBULATORY_CARE_PROVIDER_SITE_OTHER): Payer: Self-pay

## 2024-03-30 ENCOUNTER — Ambulatory Visit: Admitting: Medical-Surgical

## 2024-04-05 ENCOUNTER — Encounter: Payer: Self-pay | Admitting: Medical-Surgical

## 2024-04-06 ENCOUNTER — Ambulatory Visit (INDEPENDENT_AMBULATORY_CARE_PROVIDER_SITE_OTHER)

## 2024-04-06 VITALS — BP 124/88 | HR 81 | Temp 98.3°F | Resp 18 | Ht 61.5 in | Wt 212.0 lb

## 2024-04-06 DIAGNOSIS — R0989 Other specified symptoms and signs involving the circulatory and respiratory systems: Secondary | ICD-10-CM | POA: Diagnosis not present

## 2024-04-06 DIAGNOSIS — R051 Acute cough: Secondary | ICD-10-CM | POA: Diagnosis not present

## 2024-04-06 DIAGNOSIS — J029 Acute pharyngitis, unspecified: Secondary | ICD-10-CM | POA: Diagnosis not present

## 2024-04-06 DIAGNOSIS — J020 Streptococcal pharyngitis: Secondary | ICD-10-CM

## 2024-04-06 DIAGNOSIS — F419 Anxiety disorder, unspecified: Secondary | ICD-10-CM | POA: Diagnosis not present

## 2024-04-06 LAB — POC COVID19/FLU A&B COMBO
Covid Antigen, POC: NEGATIVE
Influenza A Antigen, POC: NEGATIVE
Influenza B Antigen, POC: NEGATIVE

## 2024-04-06 LAB — POCT RAPID STREP A (OFFICE): Rapid Strep A Screen: POSITIVE — AB

## 2024-04-06 MED ORDER — AMOXICILLIN-POT CLAVULANATE 875-125 MG PO TABS: 1.0000 | ORAL_TABLET | Freq: Two times a day (BID) | ORAL | 0 refills | Status: DC

## 2024-04-06 NOTE — Addendum Note (Signed)
 Addended byBETHA WILLO MINI on: 04/06/2024 11:34 AM   Modules accepted: Orders

## 2024-04-06 NOTE — Progress Notes (Signed)
 Patient is here to be tested for covid, flu, and strep. Sore throat, runny nose, and sneezing x2 days. Denies CP, SOB, headache or fever.  Patient tested for all three. Strep came back positive. Zada palin, NP is going to send in the antibiotic Augmentin  for the patient to take. Advised pt to f/u if symptoms persist or worsen. Pt also inquiring about the ENT referral that was placed. Will reach out to them for an update.

## 2024-04-06 NOTE — Telephone Encounter (Signed)
Patient scheduled for nurse visit this morning.  

## 2024-04-20 DIAGNOSIS — F419 Anxiety disorder, unspecified: Secondary | ICD-10-CM | POA: Diagnosis not present

## 2024-05-01 ENCOUNTER — Other Ambulatory Visit (HOSPITAL_COMMUNITY): Payer: Self-pay

## 2024-05-02 ENCOUNTER — Other Ambulatory Visit: Payer: Self-pay

## 2024-05-02 ENCOUNTER — Encounter: Payer: Self-pay | Admitting: Pharmacist

## 2024-05-03 ENCOUNTER — Encounter (INDEPENDENT_AMBULATORY_CARE_PROVIDER_SITE_OTHER): Payer: Self-pay | Admitting: Physician Assistant

## 2024-05-03 ENCOUNTER — Telehealth (INDEPENDENT_AMBULATORY_CARE_PROVIDER_SITE_OTHER): Payer: Self-pay | Admitting: Physician Assistant

## 2024-05-03 ENCOUNTER — Telehealth (INDEPENDENT_AMBULATORY_CARE_PROVIDER_SITE_OTHER): Payer: Self-pay

## 2024-05-03 ENCOUNTER — Ambulatory Visit (INDEPENDENT_AMBULATORY_CARE_PROVIDER_SITE_OTHER): Admitting: Physician Assistant

## 2024-05-03 VITALS — BP 125/85 | HR 88 | Temp 97.6°F | Ht 61.0 in | Wt 210.0 lb

## 2024-05-03 DIAGNOSIS — H66005 Acute suppurative otitis media without spontaneous rupture of ear drum, recurrent, left ear: Secondary | ICD-10-CM

## 2024-05-03 DIAGNOSIS — J0391 Acute recurrent tonsillitis, unspecified: Secondary | ICD-10-CM | POA: Diagnosis not present

## 2024-05-03 DIAGNOSIS — H6692 Otitis media, unspecified, left ear: Secondary | ICD-10-CM | POA: Diagnosis not present

## 2024-05-03 DIAGNOSIS — J358 Other chronic diseases of tonsils and adenoids: Secondary | ICD-10-CM

## 2024-05-03 DIAGNOSIS — J309 Allergic rhinitis, unspecified: Secondary | ICD-10-CM | POA: Diagnosis not present

## 2024-05-03 DIAGNOSIS — J039 Acute tonsillitis, unspecified: Secondary | ICD-10-CM

## 2024-05-03 DIAGNOSIS — H9192 Unspecified hearing loss, left ear: Secondary | ICD-10-CM

## 2024-05-03 MED ORDER — AMOXICILLIN-POT CLAVULANATE 875-125 MG PO TABS: 1.0000 | ORAL_TABLET | Freq: Two times a day (BID) | ORAL | 0 refills | Status: AC

## 2024-05-03 NOTE — Telephone Encounter (Signed)
 Called patient back regarding medication question. Patient explains that the pharmacy is having issues filling the medication. I called and left a voicemail for the pharmacy telling them to give us  a call back regarding the medication.

## 2024-05-03 NOTE — Telephone Encounter (Signed)
 Patient called in with concerns about prescription written at 05/03/2024 visit.  Please call.

## 2024-05-03 NOTE — Progress Notes (Signed)
 Dear Dr. Willo, Here is my assessment for our mutual patient, Erica Boyer. Thank you for allowing me the opportunity to care for your patient. Please do not hesitate to contact me should you have any other questions. Sincerely, Chyrl Cohen PA-C  Otolaryngology Clinic Note Referring provider: Dr. Willo HPI:  Erica Boyer is a 38 y.o. female kindly referred by Dr. Willo   Discussed the use of AI scribe software for clinical note transcription with the patient, who gave verbal consent to proceed.  History of Present Illness   Erica Boyer is a 38 year old female with recurrent tonsillitis and ear infections who presents with a sore throat and ear pain.  She has a history of recurrent tonsillitis and strep throat throughout her life, with increased frequency since July. Throat issues occur almost every other month, with symptoms including sore throat, redness, swelling, and tonsil stones. Symptoms are worse in the fall and early spring, coinciding with severe allergies. She has been on antibiotics multiple times in the past years, with three courses last year and four the year before. Swabs sometimes confirm strep, but not always. Tonsil stones are present intermittently, often resolving with antibiotic treatment.  She reports a sore throat since Tuesday and woke up at 3 AM with excruciating left ear pain, ringing, dizziness, and muffled hearing. She used a heating pad for relief. She has a history of frequent ear infections since childhood, with the last episode occurring two weeks ago. She takes Zyrtec  and Flonase  daily for allergies, occasionally switching to Allegra D. No fever, but she took Tylenol  for pain relief.  Her allergies are described as 'horrible,' and she experiences postnasal drip. She has a history of asthma and allergies. She reports a bad odor and taste in her mouth associated with tonsil stones.           Independent Review of Additional Tests or Records:   none   PMH/Meds/All/SocHx/FamHx/ROS:   Past Medical History:  Diagnosis Date   AMA (advanced maternal age) multigravida 35+ 12/13/2022   Anxiety with depression 02/02/2021   Asthma    Fetal arrhythmia affecting pregnancy, antepartum 03/24/2023   On Digoxin > has correct fetal arrhythmia for now  Twice weekly NST at [facility]  Plan is for delivery at [facility] with [facility] to continue there.      Fructose intolerance    Gastroesophageal reflux disease 11/13/2021   GERD (gastroesophageal reflux disease)    Gestational hypertension, third trimester 04/12/2023   History of gestational hypertension 11/30/2022   History of loop electrosurgical excision procedure (LEEP) of cervix affecting pregnancy in third trimester 04/12/2023   History of vitamin D  deficiency 11/13/2021   Lactose intolerance    Large for gestational age fetus affecting management of mother 02/25/2023   Migraines 03/29/2006   Polyhydramnios affecting pregnancy 02/25/2023   Supervision of normal pregnancy 09/28/2022              NURSING     PROVIDER      Office Location    [facility]    Dating by    LMP c/w U/S at 11 wks      [facility] Model    Traditional    Anatomy U/S           Initiated care at     [facility]  LAB RESULTS       Support Person    Doug    Genetics    NIPS: low risk        AFP:                            NT/IT (FT only)                     Vaginal Pap smear, abnormal      Past Surgical History:  Procedure Laterality Date   jaw procedure  11/10/2023   growth removed from jaw   NO PAST SURGERIES      Family History  Problem Relation Age of Onset   Hypertension Mother    Sleep apnea Mother    Hyperlipidemia Father    Heart failure Father    Esophageal cancer Father    Throat cancer Father    Diabetes Maternal Grandmother    Heart failure Paternal Grandfather      Social Connections: Moderately Integrated (05/03/2024)   Social Connection and Isolation  Panel    Frequency of Communication with Friends and Family: More than three times a week    Frequency of Social Gatherings with Friends and Family: Twice a week    Attends Religious Services: More than 4 times per year    Active Member of [employer] or Organizations: Yes    Attends Engineer, Structural: More than 4 times per year    Marital Status: Separated      Current Outpatient Medications:    albuterol  (VENTOLIN  HFA) 108 (90 Base) MCG/ACT inhaler, Inhale 2 puffs into the lungs every 4 (four) hours as needed for wheezing or shortness of breath., Disp: 18 g, Rfl: 0   amoxicillin -clavulanate (AUGMENTIN ) 875-125 MG tablet, Take 1 tablet by mouth 2 (two) times daily for 14 days., Disp: 14 tablet, Rfl: 0   chlorhexidine  (PERIDEX ) 0.12 % solution, Rinse mouth with 15 mL (1 capful) for 30 seconds in the morning and evening after toothbrushing. Spit after rinsing, do not swallow., Disp: 473 mL, Rfl: 2   cyclobenzaprine  (FLEXERIL ) 10 MG tablet, Take 0.5-1 tablets (5-10 mg total) by mouth 3 (three) times daily as needed., Disp: 30 tablet, Rfl: 0   dicyclomine  (BENTYL ) 10 MG capsule, Take 1-2 capsules (10-20 mg total) by mouth 4 (four) times daily -  before meals and at bedtime., Disp: 90 capsule, Rfl: 1   escitalopram  (LEXAPRO ) 10 MG tablet, Take 1.5 tablets (15 mg total) by mouth daily., Disp: 45 tablet, Rfl: 0   famotidine (PEPCID) 20 MG tablet, Take 20 mg by mouth 2 (two) times daily., Disp: , Rfl:    fluticasone  (FLONASE ) 50 MCG/ACT nasal spray, Place 2 sprays into both nostrils daily., Disp: 16 g, Rfl: 0   gabapentin  (NEURONTIN ) 300 MG capsule, Take 1 capsule (300 mg total) by mouth 3 (three) times daily as needed., Disp: 90 capsule, Rfl: 3   hydrOXYzine  (ATARAX ) 25 MG tablet, Take 0.5-2 tablets (12.5-50 mg total) by mouth 3 (three) times daily as needed., Disp: 180 tablet, Rfl: 5   meloxicam  (MOBIC ) 15 MG tablet, Take 1 tablet (15 mg total) by mouth daily., Disp: 30 tablet, Rfl: 0    nortriptyline  (PAMELOR ) 25 MG capsule, Take 1 capsule (25 mg total) by mouth at bedtime., Disp: 60 capsule, Rfl: 0   ondansetron  (ZOFRAN -ODT) 4 MG disintegrating tablet, Take 1 tablet (4 mg total) by mouth every 8 (eight) hours as  needed for nausea or vomiting., Disp: 20 tablet, Rfl: 3   pantoprazole  (PROTONIX ) 40 MG tablet, Take 1 tablet (40 mg total) by mouth daily., Disp: 30 tablet, Rfl: 3   rizatriptan  (MAXALT -MLT) 10 MG disintegrating tablet, Take 1 tablet (10 mg total) by mouth as needed for migraine. May repeat in 2 hours if needed, Disp: 10 tablet, Rfl: 3   Physical Exam:   BP 125/85   Pulse 88   Temp 97.6 F (36.4 C)   Ht 5' 1 (1.549 m)   Wt 210 lb (95.3 kg)   SpO2 95%   BMI 39.68 kg/m   Pertinent Findings  CN II-XII grossly intact Bilateral EAC clear and TM intact with redness and bulging of the left TM, right TM intact with well-pneumatized middle ear space Anterior rhinoscopy: Septum mid; bilateral inferior turbinates with mild hypertrophy No lesions of oral cavity/oropharynx; dentition within normal limits, 2+ tonsils, no tonsil stones, no erythema, no exudate No obviously palpable neck masses/lymphadenopathy/thyromegaly No respiratory distress or stridor   Seprately Identifiable Procedures:  None  Impression & Plans:  Erica Boyer is a 38 y.o. female with the following   Assessment and Plan    Acute left otitis media with hearing loss  - Prescribed Augmentin  for infection. - Advised Tylenol  or ibuprofen  for pain. - Instructed to monitor hearing post-antibiotics. - Advised follow-up if no hearing improvement in two weeks.  Recurrent tonsillitis with tonsil stones Recurrent tonsillitis with tonsil stones. Tonsils grade 2, not significantly enlarged. Differential includes strep pharyngitis, tonsil stones, and allergies. Tonsillectomy considered if symptoms persist despite treatment.   - Advised saline irrigation of sinuses daily. - Recommended daily  antihistamine. - Instructed on salt water gargling. - Scheduled follow-up in three months. - Advised to return if tonsils swell for further evaluation.  Allergic rhinitis Chronic allergic rhinitis with congestion and postnasal drainage, contributing to tonsil irritation and ear infections. Current management includes Zyrtec  and Flonase . Uncontrolled allergies may exacerbate symptoms. - Continue Flonase  and daily antihistamine. - Add saline irrigation of sinuses daily. - Consider azelastine if congestion persists. - Monitor for symptom improvement over three months.         - f/u 5-month follow-up, sooner as needed   Thank you for allowing me the opportunity to care for your patient. Please do not hesitate to contact me should you have any other questions.  Sincerely, Chyrl Cohen PA-C [facility] Phone: [phone] Fax: [phone]  05/03/2024, 3:56 PM

## 2024-05-03 NOTE — Progress Notes (Deleted)
 2+, no crypts, no stones todau, left ear erhtmeaotus

## 2024-05-04 ENCOUNTER — Ambulatory Visit: Admitting: Medical-Surgical

## 2024-05-04 ENCOUNTER — Encounter: Payer: Self-pay | Admitting: Medical-Surgical

## 2024-05-04 VITALS — BP 116/79 | HR 94 | Resp 20 | Ht 61.0 in | Wt 214.0 lb

## 2024-05-04 DIAGNOSIS — F321 Major depressive disorder, single episode, moderate: Secondary | ICD-10-CM

## 2024-05-04 DIAGNOSIS — M545 Low back pain, unspecified: Secondary | ICD-10-CM | POA: Diagnosis not present

## 2024-05-04 MED ORDER — ESCITALOPRAM OXALATE 20 MG PO TABS: 20.0000 mg | ORAL_TABLET | Freq: Every day | ORAL | 3 refills | Status: AC

## 2024-05-04 MED ORDER — MELOXICAM 15 MG PO TABS: 15.0000 mg | ORAL_TABLET | Freq: Every day | ORAL | 0 refills | Status: AC

## 2024-05-04 MED ORDER — CYCLOBENZAPRINE HCL 10 MG PO TABS: 5.0000 mg | ORAL_TABLET | Freq: Three times a day (TID) | ORAL | 0 refills | Status: AC | PRN

## 2024-05-04 NOTE — Progress Notes (Signed)
        Established patient visit   History of Present Illness   Discussed the use of AI scribe software for clinical note transcription with the patient, who gave verbal consent to proceed.  History of Present Illness   Erica Boyer is a 38 year old female who presents for management of chronic back pain.  Chronic low back pain - Chronic pain localized to the lower back, slightly right to the spine - Gabapentin  300 mg taken two to three times daily provides partial relief - Meloxicam  is ineffective for pain control - Flexeril  discontinued due to sedation - Nortriptyline  discontinued due to adverse effects with Lexapro  - Physical therapy not initiated due to caregiving responsibilities for her mother post-surgery  Depressive and anxiety symptoms - Depression and anxiety managed with Lexapro , recently increased to 20 mg - Improvement in mood and functionality with the higher dose of Lexapro  - No thoughts of self-harm or harm to others  Psychosocial stressors - Significant stress related to caregiving for her post-surgical mother - Additional stress from managing her own health issues - Ongoing emotional distress due to separation from a narcissistic partner - Feelings of being overwhelmed by responsibilities and lack of personal time  Physical Exam   Physical Exam Vitals reviewed.  Constitutional:      General: She is not in acute distress.    Appearance: Normal appearance.  HENT:     Head: Normocephalic and atraumatic.  Cardiovascular:     Rate and Rhythm: Normal rate and regular rhythm.     Pulses: Normal pulses.     Heart sounds: Normal heart sounds. No murmur heard.    No friction rub. No gallop.  Pulmonary:     Effort: Pulmonary effort is normal. No respiratory distress.     Breath sounds: Normal breath sounds. No wheezing.  Skin:    General: Skin is warm and dry.  Neurological:     Mental Status: She is alert and oriented to person, place, and time.   Psychiatric:        Mood and Affect: Mood normal.        Behavior: Behavior normal.        Thought Content: Thought content normal.        Judgment: Judgment normal.    Assessment & Plan   Assessment and Plan    Low back pain, right paraspinal region Chronic low back pain in the right paraspinal area, exacerbated by caregiving duties. Gabapentin  effective; meloxicam  ineffective. Flexeril  causes sedation. Physical therapy delayed. - Continue gabapentin  300 mg two to three times daily. - Consider half a tablet of Flexeril  for muscle spasms. - Encouraged initiation of physical therapy exercises. - Advised periodic short bursts of physical therapy exercises. - Ensure meloxicam  availability for as-needed use.  Depression, major, single episode, moderate (HCC)  Symptoms worsened by stressors. Lexapro  effective at 20 mg. Prefers current therapist. No suicidal ideation. - Increase Lexapro  to 20 mg daily. - Monitor mood and stress, adjust treatment as needed.     Follow up   Return if symptoms worsen or fail to improve. __________________________________ Zada FREDRIK Palin, DNP, APRN, FNP-BC Primary Care and Sports Medicine [facility]

## 2024-05-07 ENCOUNTER — Other Ambulatory Visit: Payer: Self-pay

## 2024-05-11 ENCOUNTER — Telehealth (INDEPENDENT_AMBULATORY_CARE_PROVIDER_SITE_OTHER): Payer: Self-pay

## 2024-05-11 ENCOUNTER — Telehealth (INDEPENDENT_AMBULATORY_CARE_PROVIDER_SITE_OTHER): Payer: Self-pay | Admitting: Physician Assistant

## 2024-05-11 MED ORDER — AMOXICILLIN-POT CLAVULANATE 875-125 MG PO TABS: 1.0000 | ORAL_TABLET | Freq: Two times a day (BID) | ORAL | 0 refills | Status: AC

## 2024-05-11 NOTE — Telephone Encounter (Signed)
Thanks I spoke with her

## 2024-05-11 NOTE — Telephone Encounter (Signed)
 I spoke with the patient today she has ongoing left ear pain, she has completed 7 days of the amoxicillin .  I originally planned on treating her for 14 days but she was unable to fill the entire 14-day supply.  I have sent in a prescription for 7 more days I like to see her early next week for repeat evaluation, she will call if she has any questions or concerns in the meantime.

## 2024-05-11 NOTE — Telephone Encounter (Signed)
 The antibiotic that PA Chyrl hedges gave her patient stated is not working for her, and if there is anything else PA Chyrl hedges can send her.

## 2024-06-03 ENCOUNTER — Encounter: Payer: Self-pay | Admitting: Medical-Surgical

## 2024-06-04 MED ORDER — POLYMYXIN B-TRIMETHOPRIM 10000-0.1 UNIT/ML-% OP SOLN: 1.0000 [drp] | OPHTHALMIC | 0 refills | Status: AC
